# Patient Record
Sex: Female | Born: 1955 | Race: White | Hispanic: No | Marital: Single | Smoking: Former smoker
Health system: Southern US, Community
[De-identification: ages and names within clinical notes are randomized; demographics above are authoritative.]

## PROBLEM LIST (undated history)

## (undated) DIAGNOSIS — J45909 Unspecified asthma, uncomplicated: Secondary | ICD-10-CM

## (undated) DIAGNOSIS — F411 Generalized anxiety disorder: Secondary | ICD-10-CM

## (undated) DIAGNOSIS — T7840XA Allergy, unspecified, initial encounter: Secondary | ICD-10-CM

## (undated) DIAGNOSIS — J309 Allergic rhinitis, unspecified: Secondary | ICD-10-CM

## (undated) DIAGNOSIS — M722 Plantar fascial fibromatosis: Secondary | ICD-10-CM

## (undated) DIAGNOSIS — D219 Benign neoplasm of connective and other soft tissue, unspecified: Secondary | ICD-10-CM

## (undated) DIAGNOSIS — E041 Nontoxic single thyroid nodule: Secondary | ICD-10-CM

## (undated) DIAGNOSIS — R002 Palpitations: Secondary | ICD-10-CM

## (undated) DIAGNOSIS — K219 Gastro-esophageal reflux disease without esophagitis: Secondary | ICD-10-CM

## (undated) DIAGNOSIS — D682 Hereditary deficiency of other clotting factors: Secondary | ICD-10-CM

## (undated) DIAGNOSIS — I82409 Acute embolism and thrombosis of unspecified deep veins of unspecified lower extremity: Secondary | ICD-10-CM

## (undated) DIAGNOSIS — M1990 Unspecified osteoarthritis, unspecified site: Secondary | ICD-10-CM

## (undated) HISTORY — DX: Unspecified asthma, uncomplicated: J45.909

## (undated) HISTORY — DX: Plantar fascial fibromatosis: M72.2

## (undated) HISTORY — DX: Gastro-esophageal reflux disease without esophagitis: K21.9

## (undated) HISTORY — PX: JOINT REPLACEMENT: SHX530

## (undated) HISTORY — DX: Hereditary deficiency of other clotting factors: D68.2

## (undated) HISTORY — DX: Acute embolism and thrombosis of unspecified deep veins of unspecified lower extremity: I82.409

## (undated) HISTORY — DX: Generalized anxiety disorder: F41.1

## (undated) HISTORY — DX: Benign neoplasm of connective and other soft tissue, unspecified: D21.9

## (undated) HISTORY — PX: NO PAST SURGERIES: SHX2092

## (undated) HISTORY — DX: Allergic rhinitis, unspecified: J30.9

## (undated) HISTORY — DX: Palpitations: R00.2

## (undated) HISTORY — DX: Nontoxic single thyroid nodule: E04.1

## (undated) HISTORY — DX: Allergy, unspecified, initial encounter: T78.40XA

---

## 1999-01-26 ENCOUNTER — Emergency Department (HOSPITAL_COMMUNITY): Admission: EM | Admit: 1999-01-26 | Discharge: 1999-01-26 | Payer: Self-pay | Admitting: Emergency Medicine

## 1999-01-28 HISTORY — PX: OTHER SURGICAL HISTORY: SHX169

## 1999-06-15 ENCOUNTER — Ambulatory Visit (HOSPITAL_COMMUNITY): Admission: RE | Admit: 1999-06-15 | Discharge: 1999-06-15 | Payer: Self-pay | Admitting: General Surgery

## 2001-05-21 ENCOUNTER — Other Ambulatory Visit: Admission: RE | Admit: 2001-05-21 | Discharge: 2001-05-21 | Payer: Self-pay | Admitting: Obstetrics and Gynecology

## 2001-05-28 ENCOUNTER — Encounter: Payer: Self-pay | Admitting: Obstetrics and Gynecology

## 2001-05-28 ENCOUNTER — Ambulatory Visit (HOSPITAL_COMMUNITY): Admission: RE | Admit: 2001-05-28 | Discharge: 2001-05-28 | Payer: Self-pay | Admitting: Obstetrics and Gynecology

## 2001-12-08 ENCOUNTER — Ambulatory Visit (HOSPITAL_COMMUNITY): Admission: RE | Admit: 2001-12-08 | Discharge: 2001-12-08 | Payer: Self-pay | Admitting: Obstetrics and Gynecology

## 2001-12-08 ENCOUNTER — Encounter: Payer: Self-pay | Admitting: Obstetrics and Gynecology

## 2002-05-26 ENCOUNTER — Other Ambulatory Visit: Admission: RE | Admit: 2002-05-26 | Discharge: 2002-05-26 | Payer: Self-pay | Admitting: Obstetrics and Gynecology

## 2003-06-02 ENCOUNTER — Other Ambulatory Visit: Admission: RE | Admit: 2003-06-02 | Discharge: 2003-06-02 | Payer: Self-pay | Admitting: Obstetrics and Gynecology

## 2003-06-30 ENCOUNTER — Ambulatory Visit (HOSPITAL_COMMUNITY): Admission: RE | Admit: 2003-06-30 | Discharge: 2003-06-30 | Payer: Self-pay | Admitting: Obstetrics and Gynecology

## 2004-11-26 ENCOUNTER — Other Ambulatory Visit: Admission: RE | Admit: 2004-11-26 | Discharge: 2004-11-26 | Payer: Self-pay | Admitting: Obstetrics and Gynecology

## 2007-08-20 ENCOUNTER — Emergency Department (HOSPITAL_COMMUNITY): Admission: EM | Admit: 2007-08-20 | Discharge: 2007-08-20 | Payer: Self-pay | Admitting: Emergency Medicine

## 2007-08-20 ENCOUNTER — Ambulatory Visit: Payer: Self-pay | Admitting: Surgery

## 2007-08-20 ENCOUNTER — Ambulatory Visit: Admission: RE | Admit: 2007-08-20 | Discharge: 2007-08-20 | Payer: Self-pay | Admitting: Family Medicine

## 2007-08-20 ENCOUNTER — Encounter: Payer: Self-pay | Admitting: Family Medicine

## 2007-08-20 DIAGNOSIS — I82409 Acute embolism and thrombosis of unspecified deep veins of unspecified lower extremity: Secondary | ICD-10-CM | POA: Insufficient documentation

## 2007-08-25 ENCOUNTER — Encounter: Payer: Self-pay | Admitting: Internal Medicine

## 2007-09-09 ENCOUNTER — Ambulatory Visit: Payer: Self-pay | Admitting: Oncology

## 2007-09-23 LAB — CBC WITH DIFFERENTIAL/PLATELET
BASO%: 0.4 % (ref 0.0–2.0)
Basophils Absolute: 0 10e3/uL (ref 0.0–0.1)
EOS%: 1.7 % (ref 0.0–7.0)
Eosinophils Absolute: 0.1 10e3/uL (ref 0.0–0.5)
HCT: 40.5 % (ref 34.8–46.6)
HGB: 14 g/dL (ref 11.6–15.9)
LYMPH%: 32.1 % (ref 14.0–48.0)
MCH: 31.7 pg (ref 26.0–34.0)
MCHC: 34.5 g/dL (ref 32.0–36.0)
MCV: 91.8 fL (ref 81.0–101.0)
MONO#: 0.6 10e3/uL (ref 0.1–0.9)
MONO%: 8 % (ref 0.0–13.0)
NEUT#: 4.3 10e3/uL (ref 1.5–6.5)
NEUT%: 57.8 % (ref 39.6–76.8)
Platelets: 194 10e3/uL (ref 145–400)
RBC: 4.41 10e6/uL (ref 3.70–5.32)
RDW: 13.5 % (ref 11.3–14.5)
WBC: 7.4 10e3/uL (ref 3.9–10.0)
lymph#: 2.4 10e3/uL (ref 0.9–3.3)

## 2007-10-11 LAB — HYPERCOAGULABLE PANEL, COMPREHENSIVE RET.
DRVVT 1:1 Mix: 38.8 secs (ref 36.1–47.0)
DRVVT: 54.5 secs — ABNORMAL HIGH (ref 36.1–47.0)
Homocysteine: 6 umol/L (ref 4.0–15.4)
PTT Lupus Anticoagulant: 53.4 secs — ABNORMAL HIGH (ref 36.3–48.8)
Protein C Activity: 33 % — ABNORMAL LOW (ref 75–133)
Protein C, Total: 58 % — ABNORMAL LOW (ref 70–140)
Protein S Ag, Total: 68 % — ABNORMAL LOW (ref 70–140)

## 2007-10-26 ENCOUNTER — Ambulatory Visit: Payer: Self-pay | Admitting: Oncology

## 2008-08-27 DIAGNOSIS — E041 Nontoxic single thyroid nodule: Secondary | ICD-10-CM

## 2008-08-27 HISTORY — DX: Nontoxic single thyroid nodule: E04.1

## 2008-09-01 ENCOUNTER — Encounter: Admission: RE | Admit: 2008-09-01 | Discharge: 2008-09-01 | Payer: Self-pay | Admitting: Emergency Medicine

## 2008-09-01 ENCOUNTER — Encounter: Payer: Self-pay | Admitting: Internal Medicine

## 2009-02-07 DIAGNOSIS — M7989 Other specified soft tissue disorders: Secondary | ICD-10-CM

## 2009-02-09 ENCOUNTER — Ambulatory Visit: Payer: Self-pay

## 2009-02-09 ENCOUNTER — Ambulatory Visit: Payer: Self-pay | Admitting: Internal Medicine

## 2009-02-09 ENCOUNTER — Encounter: Payer: Self-pay | Admitting: Internal Medicine

## 2009-02-09 DIAGNOSIS — J45909 Unspecified asthma, uncomplicated: Secondary | ICD-10-CM | POA: Insufficient documentation

## 2009-02-09 DIAGNOSIS — K219 Gastro-esophageal reflux disease without esophagitis: Secondary | ICD-10-CM | POA: Insufficient documentation

## 2009-02-09 DIAGNOSIS — J309 Allergic rhinitis, unspecified: Secondary | ICD-10-CM | POA: Insufficient documentation

## 2009-02-09 LAB — CONVERTED CEMR LAB

## 2009-02-12 DIAGNOSIS — E041 Nontoxic single thyroid nodule: Secondary | ICD-10-CM

## 2009-02-15 ENCOUNTER — Telehealth: Payer: Self-pay | Admitting: Internal Medicine

## 2009-02-19 ENCOUNTER — Ambulatory Visit: Payer: Self-pay | Admitting: Oncology

## 2009-02-21 ENCOUNTER — Telehealth: Payer: Self-pay | Admitting: Internal Medicine

## 2009-02-26 ENCOUNTER — Ambulatory Visit: Payer: Self-pay | Admitting: Cardiology

## 2009-02-26 ENCOUNTER — Encounter (INDEPENDENT_AMBULATORY_CARE_PROVIDER_SITE_OTHER): Payer: Self-pay | Admitting: *Deleted

## 2009-02-26 LAB — CONVERTED CEMR LAB: POC INR: 1.1

## 2009-03-01 ENCOUNTER — Ambulatory Visit: Payer: Self-pay | Admitting: Internal Medicine

## 2009-03-01 LAB — CONVERTED CEMR LAB: POC INR: 1.6

## 2009-03-05 ENCOUNTER — Telehealth: Payer: Self-pay | Admitting: Cardiology

## 2009-03-05 ENCOUNTER — Encounter (INDEPENDENT_AMBULATORY_CARE_PROVIDER_SITE_OTHER): Payer: Self-pay | Admitting: Cardiology

## 2009-03-05 ENCOUNTER — Ambulatory Visit: Payer: Self-pay | Admitting: Cardiology

## 2009-03-07 ENCOUNTER — Ambulatory Visit: Payer: Self-pay | Admitting: Cardiology

## 2009-03-12 ENCOUNTER — Ambulatory Visit: Payer: Self-pay | Admitting: Internal Medicine

## 2009-03-12 LAB — CONVERTED CEMR LAB: TSH: 1.12 microintl units/mL (ref 0.35–5.50)

## 2009-03-14 ENCOUNTER — Ambulatory Visit: Payer: Self-pay | Admitting: Internal Medicine

## 2009-03-20 ENCOUNTER — Encounter: Admission: RE | Admit: 2009-03-20 | Discharge: 2009-03-20 | Payer: Self-pay | Admitting: Internal Medicine

## 2009-03-21 ENCOUNTER — Ambulatory Visit: Payer: Self-pay | Admitting: Internal Medicine

## 2009-03-21 LAB — CONVERTED CEMR LAB: POC INR: 3

## 2009-04-02 ENCOUNTER — Ambulatory Visit: Payer: Self-pay | Admitting: Internal Medicine

## 2009-04-02 LAB — CONVERTED CEMR LAB: POC INR: 2.2

## 2009-04-16 ENCOUNTER — Ambulatory Visit: Payer: Self-pay | Admitting: Cardiology

## 2009-04-16 LAB — CONVERTED CEMR LAB: POC INR: 2.9

## 2009-05-14 ENCOUNTER — Ambulatory Visit: Payer: Self-pay | Admitting: Cardiology

## 2009-05-14 LAB — CONVERTED CEMR LAB: POC INR: 3.2

## 2009-06-04 ENCOUNTER — Ambulatory Visit: Payer: Self-pay | Admitting: Cardiology

## 2009-06-04 LAB — CONVERTED CEMR LAB: POC INR: 2.6

## 2009-06-05 ENCOUNTER — Ambulatory Visit: Payer: Self-pay | Admitting: Oncology

## 2009-07-02 ENCOUNTER — Ambulatory Visit: Payer: Self-pay | Admitting: Cardiology

## 2009-07-04 ENCOUNTER — Telehealth (INDEPENDENT_AMBULATORY_CARE_PROVIDER_SITE_OTHER): Payer: Self-pay | Admitting: *Deleted

## 2009-08-02 ENCOUNTER — Ambulatory Visit: Payer: Self-pay | Admitting: Internal Medicine

## 2009-08-30 ENCOUNTER — Ambulatory Visit: Payer: Self-pay | Admitting: Cardiovascular Disease

## 2009-08-30 LAB — CONVERTED CEMR LAB: POC INR: 2.4

## 2009-10-04 ENCOUNTER — Ambulatory Visit: Payer: Self-pay | Admitting: Internal Medicine

## 2009-11-01 ENCOUNTER — Ambulatory Visit: Payer: Self-pay | Admitting: Internal Medicine

## 2009-11-29 ENCOUNTER — Ambulatory Visit: Payer: Self-pay | Admitting: Cardiology

## 2009-12-27 ENCOUNTER — Ambulatory Visit: Payer: Self-pay | Admitting: Cardiovascular Disease

## 2009-12-27 LAB — CONVERTED CEMR LAB: POC INR: 2.4

## 2010-01-24 ENCOUNTER — Ambulatory Visit: Payer: Self-pay | Admitting: Cardiology

## 2010-01-24 LAB — CONVERTED CEMR LAB: POC INR: 2.2

## 2010-02-04 ENCOUNTER — Ambulatory Visit: Payer: Self-pay | Admitting: Oncology

## 2010-02-06 ENCOUNTER — Encounter: Payer: Self-pay | Admitting: Internal Medicine

## 2010-02-21 ENCOUNTER — Emergency Department (HOSPITAL_BASED_OUTPATIENT_CLINIC_OR_DEPARTMENT_OTHER)
Admission: EM | Admit: 2010-02-21 | Discharge: 2010-02-22 | Payer: Self-pay | Source: Home / Self Care | Admitting: Emergency Medicine

## 2010-02-21 ENCOUNTER — Ambulatory Visit: Admission: RE | Admit: 2010-02-21 | Discharge: 2010-02-21 | Payer: Self-pay | Source: Home / Self Care

## 2010-02-21 ENCOUNTER — Encounter: Payer: Self-pay | Admitting: Internal Medicine

## 2010-02-21 LAB — CONVERTED CEMR LAB
Basophils Relative: 0 %
CO2: 26 meq/L
Calcium: 9.9 mg/dL
Eosinophils Relative: 2 %
Glucose, Bld: 105 mg/dL
Hemoglobin: 14.2 g/dL
Lymphocytes, automated: 34 %
Monocytes Relative: 9 %
Neutrophils Relative %: 56 %
POC INR: 2.5
WBC: 9.5 10*3/uL

## 2010-02-21 LAB — CBC
Hemoglobin: 14.2 g/dL (ref 12.0–15.0)
MCH: 30.7 pg (ref 26.0–34.0)
MCV: 88.1 fL (ref 78.0–100.0)
RBC: 4.62 MIL/uL (ref 3.87–5.11)

## 2010-02-21 LAB — DIFFERENTIAL
Basophils Relative: 0 % (ref 0–1)
Lymphs Abs: 3.2 10*3/uL (ref 0.7–4.0)
Monocytes Relative: 9 % (ref 3–12)
Neutro Abs: 5.3 10*3/uL (ref 1.7–7.7)
Neutrophils Relative %: 56 % (ref 43–77)

## 2010-02-21 LAB — BASIC METABOLIC PANEL
GFR calc non Af Amer: >60 mL/min (ref >60)
Glucose, Bld: 105 mg/dL — ABNORMAL HIGH (ref 70–99)
Potassium: 4 mEq/L (ref 3.5–5.1)
Sodium: 142 mEq/L (ref 135–145)

## 2010-02-21 LAB — PROTIME-INR: Prothrombin Time: 21.3 seconds — ABNORMAL HIGH (ref 11.6–15.2)

## 2010-02-22 ENCOUNTER — Telehealth (INDEPENDENT_AMBULATORY_CARE_PROVIDER_SITE_OTHER): Payer: Self-pay | Admitting: *Deleted

## 2010-02-22 ENCOUNTER — Ambulatory Visit
Admission: RE | Admit: 2010-02-22 | Discharge: 2010-02-22 | Payer: Self-pay | Source: Home / Self Care | Attending: Internal Medicine | Admitting: Internal Medicine

## 2010-02-22 DIAGNOSIS — B079 Viral wart, unspecified: Secondary | ICD-10-CM | POA: Insufficient documentation

## 2010-02-22 DIAGNOSIS — R0602 Shortness of breath: Secondary | ICD-10-CM | POA: Insufficient documentation

## 2010-02-26 NOTE — Medication Information (Signed)
Summary: rov/td  Anticoagulant Therapy  Managed by: Cloyde Reams, RN, BSN Referring MD: Rene Paci, MD PCP: Newt Lukes MD Supervising MD: Tenny Craw MD, Gunnar Fusi Indication 1: DVT Lab Used: [facility] [place] Site: [address] INR POC 1.6 INR RANGE 2.0 - 3.0  Dietary changes: no    Health status changes: no    Bleeding/hemorrhagic complications: no    Recent/future hospitalizations: no    Any changes in medication regimen? no    Recent/future dental: no  Any missed doses?: no       Is patient compliant with meds? yes      Comments: Contines on Lovenox.    Allergies: 1)  ! Codeine  Anticoagulation Management History:      The patient is taking warfarin and comes in today for a routine follow up visit.  Negative risk factors for bleeding include an age less than 24 years old.  The bleeding index is 'low risk'.  Negative CHADS2 values include Age > 67 years old.  Anticoagulation responsible provider: Tenny Craw MD, Gunnar Fusi.  INR POC: 1.6.  Cuvette Lot#: [id number].  Exp: 04/2010.    Anticoagulation Management Assessment/Plan:      The patient's current anticoagulation dose is Warfarin sodium 5 mg tabs: Marland Kitchen  The next INR is due 03/05/2009.  Anticoagulation instructions were given to patient.  Results were reviewed/authorized by Cloyde Reams, RN, BSN.  She was notified by Cloyde Reams RN.         Prior Anticoagulation Instructions: INR 1.1  Take 10 mg = 2 tabs daily for 3 days until recheck INR on Thursday. Continue lovenox 110 mg subcutaneously two times a day until recheck.    Current Anticoagulation Instructions: INR 1.6  Continue on 10mg  daily.  Continue Lovenox twice daily and Recheck  on Monday.   Prescriptions: LOVENOX 120 MG/0.8ML SOLN (ENOXAPARIN SODIUM) Inject 110 mg subcutaneously into abdomen two times a day.  #14 x 0   Entered by:   Cloyde Reams RN   Authorized by:   Rollene Rotunda, MD, [facility]   Signed by:   Cloyde Reams RN on 03/01/2009  Method used:   Print then Give to Patient   RxID:   [id number]   Appended Document: rov/td    Prescriptions: LOVENOX 120 MG/0.8ML SOLN (ENOXAPARIN SODIUM) Inject 110 mg subcutaneously into abdomen two times a day.  #14 x 0   Entered by:   Cloyde Reams RN   Authorized by:   Sherrill Raring, MD, [facility]   Signed by:   Cloyde Reams RN on 03/01/2009   Method used:   Faxed to ...       [facility] (retail)       [address]       [place], [place]  [address]       Ph: [phone]       Fax: [phone]   RxID:   [id number]

## 2010-02-26 NOTE — Letter (Signed)
Summary: Handout Printed  Printed Handout:  - Coumadin Instructions-w/out Meds 

## 2010-02-26 NOTE — Progress Notes (Signed)
Summary: missing direction on warfarin   Phone Note From Pharmacy   Caller: [facility] [phone] ref # [id number] Request: Speak with Nurse Details for Reason: missing direction on warfain 5 mg.  Initial call taken by: Lorne Skeens,  March 05, 2009 11:41 AM  Follow-up for Phone Call        Called back to [facility] at 1210.Marland Kitchenmp clarified to: use as directed by anticoagulation clinic up to 2 tabs/daily.  180 tabs 1 refill (confirmed with Sonny, RPh) Follow-up by: Shelby Dubin PharmD, BCPS, CPP,  March 05, 2009 12:15 PM

## 2010-02-26 NOTE — Medication Information (Signed)
Summary: rov/ewj  Anticoagulant Therapy  Managed by: Bethena Midget, RN, BSN Referring MD: Rene Paci, MD PCP: Newt Lukes MD Supervising MD: Ladona Ridgel MD, Sharlot Gowda Indication 1: DVT Lab Used: [facility] [place] Site: [address] INR POC 2.2 INR RANGE 2.0 - 3.0  Dietary changes: no    Health status changes: no    Bleeding/hemorrhagic complications: no    Recent/future hospitalizations: no    Any changes in medication regimen? no    Recent/future dental: no  Any missed doses?: yes     Details: missed Sunday dose  Is patient compliant with meds? yes       Allergies: 1)  ! Codeine  Anticoagulation Management History:      The patient is taking warfarin and comes in today for a routine follow up visit.  Negative risk factors for bleeding include an age less than 34 years old.  The bleeding index is 'low risk'.  Negative CHADS2 values include Age > 49 years old.  Anticoagulation responsible provider: Ladona Ridgel MD, Sharlot Gowda.  INR POC: 2.2.  Cuvette Lot#: [id number].  Exp: 05/2010.    Anticoagulation Management Assessment/Plan:      The patient's current anticoagulation dose is Warfarin sodium 5 mg tabs: Marland Kitchen  The target INR is 2.0-3.0.  The next INR is due 04/16/2009.  Anticoagulation instructions were given to patient.  Results were reviewed/authorized by Bethena Midget, RN, BSN.  She was notified by Bethena Midget, RN, BSN.         Prior Anticoagulation Instructions: INR 3.0  Continue on same dosage 10mg  daily except 5mg  on Wednesdays. Recheck in 10 days.      Current Anticoagulation Instructions: INR 2.2 Today take 15mg s then resume 10mg s daily except 5mg s on Wednesdays. Recheck in 2 weeks.

## 2010-02-26 NOTE — Assessment & Plan Note (Signed)
Summary: FOLLOW UP-LB   Vital Signs:  Patient profile:   55 year old female Height:      69.5 inches (176.53 cm) Weight:      238.0 pounds (108.18 kg) O2 Sat:      96 % on Room air Temp:     98.2 degrees F (36.78 degrees C) oral Pulse rate:   86 / minute BP sitting:   112 / 72  (left arm) Cuff size:   regular  Vitals Entered By: Orlan Leavens (March 12, 2009 8:38 AM)  O2 Flow:  Room air CC: follow-up visit Is Patient Diabetic? No Pain Assessment Patient in pain? no        Primary Care Provider:  Newt Lukes MD  CC:  follow-up visit.  History of Present Illness: here for f/u  1) hx recurrent DVT x3 with FVL defic - last dx 1/11 has seen heme in f/u rec life long coumadin, min 6 mos no CP or SOB persisting mild swelling of leg  2) thyroid nodules - dx [place] 8/10 - due for 6 mos recheck no palp or weight changes no trouble swallowing or difficulty breathing - largely unaware of any "neck symptoms"  3)GERD - reports compliance with ongoing medical treatment and no changes in medication dose or frequency. denies adverse side effects related to current therapy.   Current Medications (verified): 1)  Advair Diskus 100-50 Mcg/dose Aepb (Fluticasone-Salmeterol) .Marland Kitchen.. 1 Puff Two Times A Day 2)  Pepcid Complete 10-800-165 Mg Chew (Famotidine-Ca Carb-Mag Hydrox) .... Take 1 By Mouth Qd 3)  Warfarin Sodium 5 Mg Tabs (Warfarin Sodium)  Allergies (verified): 1)  ! Codeine  Past History:  Past Medical History: Allergic rhinitis Asthma GERD factor v leiden defic, hx recurrent dvt (lle 1990, rle 7/09 & 1/11) thyroid nodules -[place] 8/10  MD rooster: heme - shadad  Family History: Family History of Arthritis (parent) Family History Hypertension (parent) Family History Uterine cancer (grandparent) Heart disease (grandparnet)  mom- parathyroid growth  Review of Systems  The patient denies fever, weight loss, weight gain, chest pain, syncope, peripheral edema,  and headaches.    Physical Exam  General:  overweight-appearing.  alert, well-developed, well-nourished, and cooperative to examination.    Neck:  mild fullness anteriorbilaterally  without discrete nodules appreciated - nontender Lungs:  normal respiratory effort, no intercostal retractions or use of accessory muscles; normal breath sounds bilaterally - no crackles and no wheezes.    Heart:  normal rate, regular rhythm, no murmur, and no rub. BLE with chronic 1+ edema. 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 mgmt anticoag as per [facility] ongoing  Problem # 3:  GERD (ICD-530.81) Assessment: Unchanged  Her updated medication list for this problem includes:    Pepcid Complete 10-800-165 Mg Chew (Famotidine-ca carb-mag hydrox) .Marland Kitchen... Take 1 by mouth qd  Problem # 4:  ASTHMA (ICD-493.90) Assessment: Unchanged  Her updated medication list for this problem includes:    Advair Diskus 100-50 Mcg/dose Aepb (  Fluticasone-salmeterol) .Marland Kitchen... 1 puff two times a day  Complete Medication List: 1)  Advair Diskus 100-50 Mcg/dose Aepb (Fluticasone-salmeterol) .Marland Kitchen.. 1 puff two times a day 2)  Pepcid Complete 10-800-165 Mg Chew (Famotidine-ca carb-mag hydrox) .... Take 1 by mouth qd 3)  Warfarin Sodium 5 Mg Tabs (Warfarin sodium)  Patient Instructions: 1)  it was good to see you today. 2)  test(s) ordered today - your results will be posted on the phone tree for review in 48-72  hours from the time of test completion; call [phone] and enter your 9 digit MRN (listed above on this page, just below your name); if any changes need to be made or there are abnormal results, you will be contacted directly.  3)  we'll make referral for thyroid ultrasound. Our office will contact you regarding this appointment once made.   4)  If any changes or other testing needs to occur based on these results, you will be contacted directly 5)  otherwise, please schedule a follow-up appointment in 6 months, sooner if problems.

## 2010-02-26 NOTE — Medication Information (Signed)
Summary: rov/tm  Anticoagulant Therapy  Managed by: Shelby Dubin, PharmD, BCPS, CPP Referring MD: Rene Paci, MD PCP: Newt Lukes MD Supervising MD: Shirlee Latch MD, Rhaya Coale Indication 1: DVT Lab Used: [facility] [place] Site: [address] INR POC 1.1 INR RANGE 2.0 - 3.0  Dietary changes: no    Health status changes: no    Bleeding/hemorrhagic complications: no    Recent/future hospitalizations: no    Any changes in medication regimen? yes       Details: started warfarin after Dr. Hulda Humphrey appt on Wed.    Recent/future dental: no  Any missed doses?: no       Is patient compliant with meds? yes      Comments: Took 7.5 mg for 2 days then 5 mg daily.    Current Medications (verified): 1)  Advair Diskus 100-50 Mcg/dose Aepb (Fluticasone-Salmeterol) .Marland Kitchen.. 1 Puff Two Times A Day 2)  Pepcid Complete 10-800-165 Mg Chew (Famotidine-Ca Carb-Mag Hydrox) .... Take 1 By Mouth Qd 3)  Lovenox 120 Mg/0.20ml Soln (Enoxaparin Sodium) .... Inject 110 Mg Subcutaneously Into Abdomen Two Times A Day. 4)  Warfarin Sodium 5 Mg Tabs (Warfarin Sodium)  Allergies (verified): 1)  ! Codeine  Anticoagulation Management History:      The patient is taking warfarin and comes in today for a routine follow up visit.  Negative risk factors for bleeding include an age less than 58 years old.  The bleeding index is 'low risk'.  Negative CHADS2 values include Age > 8 years old.  Anticoagulation responsible provider: Shirlee Latch MD, Austynn Pridmore.  INR POC: 1.1.  Cuvette Lot#: [id number].  Exp: 04/2010.    Anticoagulation Management Assessment/Plan:      The patient's current anticoagulation dose is Warfarin sodium 5 mg tabs: Marland Kitchen  The next INR is due 03/01/2009.  Anticoagulation instructions were given to patient.  Results were reviewed/authorized by Shelby Dubin, PharmD, BCPS, CPP.  She was notified by Bethanne Ginger.         Prior Anticoagulation Instructions: Begin lovenox 110 mg injection into  abdomen two times a day. Alternate sites and sides.    Lovenox 100 mg subcutaneously administered in RLE at 1630.  No complaints or issues.  Injection technique reviewed with patient.    Plan:  patient will begin lovenox 110 mg subcutaneously two times a day for the next week while a decision is made by Drs. Shedad and [employer] regarding potential re-initiation of warfarin therapy.  Patient verbalizes understanding that she may have to take lovenox for a longer period because we are not initiating warfarin today.    Current Anticoagulation Instructions: INR 1.1  Take 10 mg = 2 tabs daily for 3 days until recheck INR on Thursday. Continue lovenox 110 mg subcutaneously two times a day until recheck.   Prescriptions: WARFARIN SODIUM 5 MG TABS (WARFARIN SODIUM)   #90 x 1   Entered by:   Bethanne Ginger   Authorized by:   Marca Ancona, MD   Signed by:   Bethanne Ginger on 02/26/2009   Method used:   Faxed to ...       [facility] (mail-order)             , [place]         Ph: [phone]       Fax: [phone]   RxID:   [id number]

## 2010-02-26 NOTE — Medication Information (Signed)
Summary: rov/cs  Anticoagulant Therapy  Managed by: Bethena Midget, RN, BSN Referring MD: Rene Paci, MD PCP: Newt Lukes MD Supervising MD: Daleen Squibb MD, Maisie Fus Indication 1: DVT Lab Used: [facility] [place] Site: [address] INR POC 3.2 INR RANGE 2.0 - 3.0  Dietary changes: yes       Details: Ate less leafy veggies last week, had GI bug  Health status changes: yes       Details: GI bug last week, resolved now  Bleeding/hemorrhagic complications: no    Recent/future hospitalizations: no    Any changes in medication regimen? no    Recent/future dental: no  Any missed doses?: no       Is patient compliant with meds? yes       Allergies: 1)  ! Codeine  Anticoagulation Management History:      The patient is taking warfarin and comes in today for a routine follow up visit.  Negative risk factors for bleeding include an age less than 66 years old.  The bleeding index is 'low risk'.  Negative CHADS2 values include Age > 3 years old.  Anticoagulation responsible provider: Daleen Squibb MD, Maisie Fus.  INR POC: 3.2.  Cuvette Lot#: [id number].  Exp: 11/2010.    Anticoagulation Management Assessment/Plan:      The patient's current anticoagulation dose is Warfarin sodium 5 mg tabs: Marland Kitchen  The target INR is 2.0-3.0.  The next INR is due 12/27/2009.  Anticoagulation instructions were given to patient.  Results were reviewed/authorized by Bethena Midget, RN, BSN.  She was notified by Bethena Midget, RN, BSN.         Prior Anticoagulation Instructions: INR 2.8  Continue Coumadin as scheduled:  2 tablets everyday of the week except 1 tablet on Wednesday.   Return in 4 weeks.    Current Anticoagulation Instructions: INR 3.2 Today only take 1 tablet then resume 2 tablets daily except 1 tablet on Wednesdays. Recheck in 4 weeks.

## 2010-02-26 NOTE — Medication Information (Signed)
Summary: rov/tm  Anticoagulant Therapy  Managed by: Weston Brass, PharmD Referring MD: Rene Paci, MD PCP: Newt Lukes MD Supervising MD: Eden Emms MD, Theron Arista Indication 1: DVT Lab Used: [facility] [place] Site: [address] INR POC 2.4 INR RANGE 2.0 - 3.0  Dietary changes: no    Health status changes: no    Bleeding/hemorrhagic complications: no    Recent/future hospitalizations: no    Any changes in medication regimen? no    Recent/future dental: no  Any missed doses?: no       Is patient compliant with meds? yes       Allergies: 1)  ! Codeine  Anticoagulation Management History:      The patient is taking warfarin and comes in today for a routine follow up visit.  Negative risk factors for bleeding include an age less than 26 years old.  The bleeding index is 'low risk'.  Negative CHADS2 values include Age > 35 years old.  Anticoagulation responsible provider: Eden Emms MD, Theron Arista.  INR POC: 2.4.  Cuvette Lot#: [id number].  Exp: 12/2010.    Anticoagulation Management Assessment/Plan:      The patient's current anticoagulation dose is Warfarin sodium 5 mg tabs: Marland Kitchen  The target INR is 2.0-3.0.  The next INR is due 01/24/2010.  Anticoagulation instructions were given to patient.  Results were reviewed/authorized by Weston Brass, PharmD.  She was notified by Weston Brass PharmD.         Prior Anticoagulation Instructions: INR 3.2 Today only take 1 tablet then resume 2 tablets daily except 1 tablet on Wednesdays. Recheck in 4 weeks.   Current Anticoagulation Instructions: INR 2.4  Continue same dose of 2 tablets every day except 1 tablet on Wednesday.  Recheck INR in 4 weeks.

## 2010-02-26 NOTE — Medication Information (Signed)
Summary: rov/tm  Anticoagulant Therapy  Managed by: Bethena Midget, RN, BSN Referring MD: Rene Paci, MD PCP: Newt Lukes MD Supervising MD: Shirlee Latch MD, Dally Oshel Indication 1: DVT Lab Used: [facility] [place] Site: [address] INR POC 2.9 INR RANGE 2.0 - 3.0  Dietary changes: no    Health status changes: no    Bleeding/hemorrhagic complications: no    Recent/future hospitalizations: no    Any changes in medication regimen? yes       Details: Taking Sudafed and  Vitamin C PRN  Recent/future dental: no  Any missed doses?: no       Is patient compliant with meds? yes       Allergies: 1)  ! Codeine  Anticoagulation Management History:      The patient is taking warfarin and comes in today for a routine follow up visit.  Negative risk factors for bleeding include an age less than 39 years old.  The bleeding index is 'low risk'.  Negative CHADS2 values include Age > 53 years old.  Anticoagulation responsible provider: Shirlee Latch MD, Ram Haugan.  INR POC: 2.9.  Cuvette Lot#: [id number].  Exp: 05/2010.    Anticoagulation Management Assessment/Plan:      The patient's current anticoagulation dose is Warfarin sodium 5 mg tabs: Marland Kitchen  The target INR is 2.0-3.0.  The next INR is due 05/14/2009.  Anticoagulation instructions were given to patient.  Results were reviewed/authorized by Bethena Midget, RN, BSN.  She was notified by Bethena Midget, RN, BSN.         Prior Anticoagulation Instructions: INR 2.2 Today take 15mg s then resume 10mg s daily except 5mg s on Wednesdays. Recheck in 2 weeks.   Current Anticoagulation Instructions: INR 2.9 Continue 10mg  everyday except 5mg s on Wednesdays. Recheck in 4 weeks.

## 2010-02-26 NOTE — Medication Information (Signed)
Summary: rov/sp  Anticoagulant Therapy  Managed by: Weston Brass, PharmD Referring MD: Rene Paci, MD PCP: Newt Lukes MD Supervising MD: Antoine Poche MD, Fayrene Fearing Indication 1: DVT Lab Used: [facility] [place] Site: [address] INR POC 2.6 INR RANGE 2.0 - 3.0  Dietary changes: no    Health status changes: no    Bleeding/hemorrhagic complications: no    Recent/future hospitalizations: no    Any changes in medication regimen? no    Recent/future dental: no  Any missed doses?: yes     Details: missed 1 dose but took extra the next day  Is patient compliant with meds? yes       Allergies: 1)  ! Codeine  Anticoagulation Management History:      The patient is taking warfarin and comes in today for a routine follow up visit.  Negative risk factors for bleeding include an age less than 9 years old.  The bleeding index is 'low risk'.  Negative CHADS2 values include Age > 17 years old.  Anticoagulation responsible provider: Antoine Poche MD, Fayrene Fearing.  INR POC: 2.6.  Cuvette Lot#: [id number].  Exp: 08/2010.    Anticoagulation Management Assessment/Plan:      The patient's current anticoagulation dose is Warfarin sodium 5 mg tabs: Marland Kitchen  The target INR is 2.0-3.0.  The next INR is due 08/02/2009.  Anticoagulation instructions were given to patient.  Results were reviewed/authorized by Weston Brass, PharmD.  She was notified by Weston Brass PharmD.         Prior Anticoagulation Instructions: INR 2.6  Continue same dose of 2 tablets every day except 1 tablet on Wednesday   Current Anticoagulation Instructions: INR 2.6  Continue same dose of 2 tablets every day except 1 tablet on Wednesday

## 2010-02-26 NOTE — Medication Information (Signed)
Summary: rov/sp  Anticoagulant Therapy  Managed by: Weston Brass, PharmD Referring MD: Rene Paci, MD PCP: Newt Lukes MD Supervising MD: Riley Kill MD, Maisie Fus Indication 1: DVT Lab Used: [facility] [place] Site: [address] INR POC 2.6 INR RANGE 2.0 - 3.0  Dietary changes: no    Health status changes: no    Bleeding/hemorrhagic complications: no    Recent/future hospitalizations: no    Any changes in medication regimen? no    Recent/future dental: no  Any missed doses?: no       Is patient compliant with meds? yes       Allergies: 1)  ! Codeine  Anticoagulation Management History:      The patient is taking warfarin and comes in today for a routine follow up visit.  Negative risk factors for bleeding include an age less than 63 years old.  The bleeding index is 'low risk'.  Negative CHADS2 values include Age > 30 years old.  Anticoagulation responsible provider: Riley Kill MD, Maisie Fus.  INR POC: 2.6.  Cuvette Lot#: [id number].  Exp: 06/2010.    Anticoagulation Management Assessment/Plan:      The patient's current anticoagulation dose is Warfarin sodium 5 mg tabs: Marland Kitchen  The target INR is 2.0-3.0.  The next INR is due 07/02/2009.  Anticoagulation instructions were given to patient.  Results were reviewed/authorized by Weston Brass, PharmD.  She was notified by Weston Brass PharmD.         Prior Anticoagulation Instructions: INR 3.2  Take 1 tablet today then continue same dose of 2 tablets every day except 1 tablet on Wednesday   Current Anticoagulation Instructions: INR 2.6  Continue same dose of 2 tablets every day except 1 tablet on Wednesday

## 2010-02-26 NOTE — Progress Notes (Signed)
  Phone Note Refill Request Call back at [phone] Message from:  Pharmacy on July 04, 2009 2:39 PM  Refills Requested: Medication #1:  WARFARIN SODIUM 5 MG TABS.   Supply Requested: 3 months Need dosage directions/[facility], ref # [id number]   Method Requested: Fax to Mail Away Pharmacy  Follow-up for Phone Call        Spoke with [facility] Pharmacist [facility]) and gave present instructions but to add Or As Directed due to INR result.  Follow-up by: Bethena Midget, RN, BSN,  July 04, 2009 4:43 PM

## 2010-02-26 NOTE — Medication Information (Signed)
Summary: rov/ewj  Anticoagulant Therapy  Managed by: Shelby Dubin, PharmD, BCPS, CPP Referring MD: Rene Paci, MD PCP: Newt Lukes MD Supervising MD: Jens Som MD, Arlys John Indication 1: DVT Lab Used: [facility] [place] Site: [address] INR POC 1.9 INR RANGE 2.0 - 3.0  Dietary changes: no    Health status changes: no    Bleeding/hemorrhagic complications: no    Recent/future hospitalizations: no    Any changes in medication regimen? no    Recent/future dental: no  Any missed doses?: yes     Details: missed Sunday am dose of lovenox...  Is patient compliant with meds? yes      Comments: menstrual cycle started yest...normal, though heavier flow noted on day #1..  Current Medications (verified): 1)  Advair Diskus 100-50 Mcg/dose Aepb (Fluticasone-Salmeterol) .Marland Kitchen.. 1 Puff Two Times A Day 2)  Pepcid Complete 10-800-165 Mg Chew (Famotidine-Ca Carb-Mag Hydrox) .... Take 1 By Mouth Qd 3)  Lovenox 120 Mg/0.79ml Soln (Enoxaparin Sodium) .... Inject 110 Mg Subcutaneously Into Abdomen Two Times A Day. 4)  Warfarin Sodium 5 Mg Tabs (Warfarin Sodium)  Allergies (verified): 1)  ! Codeine  Anticoagulation Management History:      The patient is taking warfarin and comes in today for a routine follow up visit.  Negative risk factors for bleeding include an age less than 65 years old.  The bleeding index is 'low risk'.  Negative CHADS2 values include Age > 49 years old.  Anticoagulation responsible provider: Jens Som MD, Arlys John.  INR POC: 1.9.  Cuvette Lot#: [id number].  Exp: 04/2010.    Anticoagulation Management Assessment/Plan:      The patient's current anticoagulation dose is Warfarin sodium 5 mg tabs: Marland Kitchen  The next INR is due 03/07/2009.  Anticoagulation instructions were given to patient.  Results were reviewed/authorized by Shelby Dubin, PharmD, BCPS, CPP.  She was notified by Shelby Dubin PharmD, BCPS, CPP.         Prior Anticoagulation Instructions: INR  1.6  Continue on 10mg  daily.  Continue Lovenox twice daily and Recheck  on Monday.    Current Anticoagulation Instructions: INR 1.9  Continue 10 mg daily.  Continue lovenox through Wednesday morning.    Recheck Wednesday morning.

## 2010-02-26 NOTE — Medication Information (Signed)
Summary: rov/ewj  Anticoagulant Therapy  Managed by: Cloyde Reams, RN, BSN Referring MD: Rene Paci, MD PCP: Newt Lukes MD Supervising MD: Jens Som MD, Arlys John Indication 1: DVT Lab Used: [facility] [place] Site: [address] INR POC 2.2 INR RANGE 2.0 - 3.0  Dietary changes: no    Health status changes: no    Bleeding/hemorrhagic complications: no    Recent/future hospitalizations: no    Any changes in medication regimen? no    Recent/future dental: no  Any missed doses?: no       Is patient compliant with meds? yes       Allergies (verified): 1)  ! Codeine  Anticoagulation Management History:      The patient is taking warfarin and comes in today for a routine follow up visit.  Negative risk factors for bleeding include an age less than 57 years old.  The bleeding index is 'low risk'.  Negative CHADS2 values include Age > 45 years old.  Anticoagulation responsible provider: Jens Som MD, Arlys John.  INR POC: 2.2.  Cuvette Lot#: [id number].  Exp: 04/2010.    Anticoagulation Management Assessment/Plan:      The patient's current anticoagulation dose is Warfarin sodium 5 mg tabs: Marland Kitchen  The next INR is due 03/14/2009.  Anticoagulation instructions were given to patient.  Results were reviewed/authorized by Cloyde Reams, RN, BSN.  She was notified by Cloyde Reams RN.         Prior Anticoagulation Instructions: INR 1.9  Continue 10 mg daily.  Continue lovenox through Wednesday morning.    Recheck Wednesday morning.    Current Anticoagulation Instructions: INR 2.2  Stop Lovenox and continue taking 10mg  daily.  Recheck in 1 week.

## 2010-02-26 NOTE — Assessment & Plan Note (Signed)
Summary: new [employer] pt-r leg pain/clot?--pkg/off-stc   Vital Signs:  Patient profile:   55 year old female Height:      69.5 inches (176.53 cm) Weight:      239.4 pounds (108.82 kg) BMI:     34.97 O2 Sat:      97 % on Room air Temp:     98.5 degrees F (36.94 degrees C) oral Pulse rate:   82 / minute BP sitting:   118 / 78  (left arm) Cuff size:   large  Vitals Entered By: Orlan Leavens (February 09, 2009 11:39 AM)  O2 Flow:  Room air CC: New patient. Having (R) leg pain, calf area x's 2 days Is Patient Diabetic? No Pain Assessment Patient in pain? yes     Location: (R) leg Type: aching   Primary Care Provider:  Newt Lukes MD  CC:  New patient. Having (R) leg pain and calf area x's 2 days.  History of Present Illness: new pt to me and our practice - here to establish care -  concerned about swelling in RLE - symptoms started 2 days ago - started as "cramp" in calf, siilar symptoms to prior DVT 7/09 hx recurrent DVT x 2, off coumadin since 04/12/2008 has seen heme for same - FVL defic noted - no CP or SOB pain/cramp has improved with ibuprofen, almost none now min increase in usual swelling of legs (common pre period and d/t prior DVTS = phlebitis)  Preventive Screening-Counseling & Management  Alcohol-Tobacco     Smoking Status: quit  Current Medications (verified): 1)  Advair Diskus 100-50 Mcg/dose Aepb (Fluticasone-Salmeterol) .Marland Kitchen.. 1 Puff Two Times A Day 2)  Pepcid Complete 10-800-165 Mg Chew (Famotidine-Ca Carb-Mag Hydrox) .... Take 1 By Mouth Qd  Allergies (verified): 1)  ! 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 I have reviewed all other systems and they were negative.   Physical Exam  General:  overweight-appearing.  alert, well-developed, well-nourished, and cooperative to examination.    Eyes:  vision grossly intact; pupils equal, round and reactive to light.  conjunctiva and lids normal.    Lungs:  normal respiratory effort, no intercostal retractions or use of accessory muscles; normal breath sounds bilaterally - no crackles and no wheezes.    Heart:  normal rate, regular rhythm, no murmur, and no rub. BLE with chronic 1+ edema. normal DP pulses and normal cap refill in all 4 extremities    Abdomen:  soft, non-tender, normal bowel sounds, no distention; no masses and no appreciable hepatomegaly or splenomegaly.   Msk:  No deformity or scoliosis noted of thoracic or lumbar spine.   Neurologic:  alert & oriented X3 and cranial nerves II-XII symetrically intact.  strength normal in all extremities, sensation intact to light touch, and gait normal. speech fluent without dysarthria or aphasia; follows commands with good comprehension.  Skin:  varicosities and redness ble distally r>l, not warm to touch .. varicose superficial clot appreciated on right calf, nontender and no ulceration Psych:  Oriented X3, memory intact for recent and remote, normally interactive, good eye contact, not anxious appearing, not depressed appearing, and not agitated.      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 Take 1 by mouth qd  Problem # 4:  ASTHMA (ICD-493.90)  Her updated medication list for this problem includes:    Advair Diskus 100-50 Mcg/dose Aepb (Fluticasone-salmeterol) .Marland Kitchen... 1 puff two times a day  Problem # 5:  THYROID NODULE (ICD-241.0) will send for records from [facility] re: prior US and w/u as pt reports she is due for f/u next mo on same and plans to follow here  Complete Medication List: 1)  Advair Diskus 100-50 Mcg/dose Aepb (Fluticasone-salmeterol) .Marland Kitchen.. 1 puff two times a day 2)  Pepcid Complete 10-800-165 Mg Chew (Famotidine-ca carb-mag hydrox) .... Take 1 by mouth qd 3)  Lovenox 120 Mg/0.35ml Soln (Enoxaparin sodium) .... Inject 110 mg subcutaneously into abdomen two times a day.  Patient Instructions: 1)  it was good to see you today.  2)  will arrange for ultrasound/doppler to exclude recurrent clot in your leg - further managment to depend on these results: if no clot, may continue ibuprofen as needed with elevation; if +clot, you will be contacted with further instructions - 3)  will send for copy of records from [place] to prepare for future followup here as due next month - 4)  Please schedule a follow-up appointment in Feb 2011 as you are due, call sooner if problems.    Immunization History:  Tetanus/Td Immunization History:    Tetanus/Td:  historical (10/16/2008)    Mammogram  Procedure date:  01/28/2007  Findings:      No specific mammographic evidence of malignancy.    Pap  Smear  Procedure date:  01/28/2007  Findings:      Interpretation/Result:Negative for intraepithelial Lesion or Malignancy.

## 2010-02-26 NOTE — Progress Notes (Signed)
Summary: Pt?  Phone Note Call from Patient Call back at Work Phone [phone]   Caller: Patient Summary of Call: pt caled stating that she will be out of Lovenox tomorrow and does not have an appt with Hemotology until 03/06/2009. pt does not know if she is to continue with Lovenox until appt or if she was to changed to another medication. I did not seen any mention of med change. please advise (should she have refills on Rx from 02/09/2009 to last until appt?) Initial call taken by: Margaret Pyle, CMA,  February 15, 2009 9:52 AM  Follow-up for Phone Call        yes she is to continue Lovenox as ongoing until appt with heme - new rx competed today (listed historically only) - please let pt know same - ?if you can call in to her pharm or if pt needs to pick up in person? - also i have called heme and left phone msg with dr. Alver Fisher scheduler asking to move appt sooner than 2/8 if at all possible - i asked for their office to return call at [phone] to advise on this appt date - Follow-up by: Newt Lukes MD,  February 15, 2009 10:03 AM  Additional Follow-up for Phone Call Additional follow up Details #1::        pt informed and stated that she does have 1 refill on her current Rx but if she needs more before her appt she will call back. Additional Follow-up by: Margaret Pyle, CMA,  February 15, 2009 10:34 AM    Prescriptions: LOVENOX 120 MG/0.8ML SOLN (ENOXAPARIN SODIUM) Inject 110 mg subcutaneously into abdomen two times a day.  #30 x 1   Entered and Authorized by:   Newt Lukes MD   Signed by:   Newt Lukes MD on 02/15/2009   Method used:   Historical   RxID:   [id number]

## 2010-02-26 NOTE — Medication Information (Signed)
Summary: [facility]  Anticoagulant Therapy  Managed by: Shelby Dubin, PharmD, BCPS, CPP Referring MD: Rene Paci, MD Supervising MD: Gala Romney MD, Reuel Boom Indication 1: DVT Lab Used: [facility] [place] Site: [address] INR RANGE 2.0 - 3.0  Dietary changes: no    Health status changes: no    Bleeding/hemorrhagic complications: no    Recent/future hospitalizations: no    Any changes in medication regimen? no    Recent/future dental: no  Any missed doses?: no       Is patient compliant with meds? yes      Comments: LE Doppler reveals: probable acute RLE DVT  Current Medications (verified): 1)  Advair Diskus 100-50 Mcg/dose Aepb (Fluticasone-Salmeterol) .Marland Kitchen.. 1 Puff Two Times A Day 2)  Pepcid Complete 10-800-165 Mg Chew (Famotidine-Ca Carb-Mag Hydrox) .... Take 1 By Mouth Qd 3)  Lovenox 120 Mg/0.58ml Soln (Enoxaparin Sodium) .... Inject 110 Mg Subcutaneously Into Abdomen Two Times A Day.  Allergies (verified): 1)  ! Codeine  Anticoagulation Management History:      The patient comes in today for her initial visit for anticoagulation therapy.  Negative risk factors for bleeding include an age less than 55 years old.  The bleeding index is 'low risk'.  Negative CHADS2 values include Age > 60 years old.  Anticoagulation responsible provider: Bensimhon MD, Reuel Boom.    Anticoagulation Management Assessment/Plan:      The next INR is due 02/16/2009.  Anticoagulation instructions were given to patient.  Results were reviewed/authorized by Shelby Dubin, PharmD, BCPS, CPP.  She was notified by Shelby Dubin PharmD, BCPS, CPP.         Current Anticoagulation Instructions: Begin lovenox 110 mg injection into abdomen two times a day. Alternate sites and sides.    Lovenox 100 mg subcutaneously administered in RLE at 1630.  No complaints or issues.  Injection technique reviewed with patient.    Plan:  patient will begin lovenox 110 mg subcutaneously two times a  day for the next week while a decision is made by Drs. Shedad and [employer] regarding potential re-initiation of warfarin therapy.  Patient verbalizes understanding that she may have to take lovenox for a longer period because we are not initiating warfarin today.   Prescriptions: LOVENOX 120 MG/0.8ML SOLN (ENOXAPARIN SODIUM) Inject 110 mg subcutaneously into abdomen two times a day.  #14 x 1   Entered by:   Shelby Dubin PharmD, BCPS, CPP   Authorized by:   Newt Lukes MD   Signed by:   Shelby Dubin PharmD, BCPS, CPP on 02/09/2009   Method used:   Faxed to ...       [facility] (retail)       [address]       [place], [place]  [address]  [place]       Ph: [phone]       Fax: [phone]   RxID:   [id number]

## 2010-02-26 NOTE — Medication Information (Signed)
Summary: rov/ewj  Anticoagulant Therapy  Managed by: Cloyde Reams, RN, BSN Referring MD: Rene Paci, MD PCP: Newt Lukes MD Supervising MD: Ladona Ridgel MD, Sharlot Gowda Indication 1: DVT Lab Used: [facility] [place] Site: [address] INR POC 3.0 INR RANGE 2.0 - 3.0  Dietary changes: no    Health status changes: no    Bleeding/hemorrhagic complications: no    Recent/future hospitalizations: no    Any changes in medication regimen? no    Recent/future dental: no  Any missed doses?: no       Is patient compliant with meds? yes       Allergies (verified): 1)  ! Codeine  Anticoagulation Management History:      The patient is taking warfarin and comes in today for a routine follow up visit.  Negative risk factors for bleeding include an age less than 65 years old.  The bleeding index is 'low risk'.  Negative CHADS2 values include Age > 83 years old.  Anticoagulation responsible provider: Ladona Ridgel MD, Sharlot Gowda.  INR POC: 3.0.  Exp: 04/2010.    Anticoagulation Management Assessment/Plan:      The patient's current anticoagulation dose is Warfarin sodium 5 mg tabs: Marland Kitchen  The target INR is 2.0-3.0.  The next INR is due 04/02/2009.  Anticoagulation instructions were given to patient.  Results were reviewed/authorized by Cloyde Reams, RN, BSN.  She was notified by Cloyde Reams RN.         Prior Anticoagulation Instructions: INR 3.1  Start taking 10mg  daily except 5mg  on Wednesdays.  Recheck in 1 week.    Current Anticoagulation Instructions: INR 3.0  Continue on same dosage 10mg  daily except 5mg  on Wednesdays. Recheck in 10 days.

## 2010-02-26 NOTE — Miscellaneous (Signed)
Summary: Orders Update  Clinical Lists Changes  Orders: Added new Test order of Venous Duplex Lower Extremity (Venous Duplex Lower) - Signed 

## 2010-02-26 NOTE — Medication Information (Signed)
Summary: rov/ewj  Anticoagulant Therapy  Managed by: Eda Keys, PharmD Referring MD: Rene Paci, MD PCP: Newt Lukes MD Supervising MD: Ladona Ridgel MD, Sharlot Gowda Indication 1: DVT Lab Used: [facility] [place] Site: [address] INR POC 3.1 INR RANGE 2.0 - 3.0  Dietary changes: no    Health status changes: no    Bleeding/hemorrhagic complications: no    Recent/future hospitalizations: no    Any changes in medication regimen? no    Recent/future dental: yes     Details: wisdom tooth extraction september 27  Any missed doses?: yes     Details: missed august 30th    Allergies: 1)  ! Codeine  Anticoagulation Management History:      The patient is taking warfarin and comes in today for a routine follow up visit.  Negative risk factors for bleeding include an age less than 34 years old.  The bleeding index is 'low risk'.  Negative CHADS2 values include Age > 58 years old.  Anticoagulation responsible Marielle Mantione: Ladona Ridgel MD, Sharlot Gowda.  INR POC: 3.1.  Cuvette Lot#: [id number].  Exp: 11/2010.    Anticoagulation Management Assessment/Plan:      The patient's current anticoagulation dose is Warfarin sodium 5 mg tabs: Marland Kitchen  The target INR is 2.0-3.0.  The next INR is due 11/01/2009.  Anticoagulation instructions were given to patient.  Results were reviewed/authorized by Eda Keys, PharmD.  She was notified by Kennieth Francois.         Prior Anticoagulation Instructions: INR 2.4  Continue on same dosage 2 tablets daily except 1 tablet on Wednesdays.  Recheck in 4 weeks.    Current Anticoagulation Instructions: INR 3.1  Take one tablet today, then resume two tablets every day except one tablet on Wednesday. We will see you in four weeks.

## 2010-02-26 NOTE — Progress Notes (Signed)
Summary: Referral  Phone Note Other Incoming   Caller: Dr. Clelia Croft Summary of Call: pt needs referral to coumadin clinic for DVT Initial call taken by: Margaret Pyle, CMA,  February 21, 2009 4:20 PM  Follow-up for Phone Call        i d/w dr. Clelia Croft pt case: 2nd spontanous DVT with FVL - rec anticoag for life, min 6 mos - starting coumadin today (in heme with OV), also on full dose lovenox --  pt will cont to follow at [facility] for same - i will let CC know and request followup for mgmt of same - thanks Follow-up by: Newt Lukes MD,  February 21, 2009 4:47 PM  Additional Follow-up for Phone Call Additional follow up Details #1::        Discussed pt. case with Dr Jimmey Ralph and we will continue Lovenox 110mg s Q12hrs and give 7.5mg s today and Friday, then resume 5mg s daily that she started on 126/11. Follow up on 02/26/09 for INR check. [facility].  Additional Follow-up by: Bethena Midget, RN, BSN,  February 22, 2009 10:51 AM

## 2010-02-26 NOTE — Medication Information (Signed)
Summary: rov/tm  Anticoagulant Therapy  Managed by: Weston Brass, PharmD Referring MD: Rene Paci, MD PCP: Newt Lukes MD Supervising MD: Juanda Chance MD, Ranier Coach Indication 1: DVT Lab Used: [facility] [place] Site: [address] INR POC 3.2 INR RANGE 2.0 - 3.0  Dietary changes: no    Health status changes: no    Bleeding/hemorrhagic complications: no    Recent/future hospitalizations: no    Any changes in medication regimen? no    Recent/future dental: yes     Details: having teeth cleaned and root canal   Any missed doses?: yes     Details: missed one dose about 2 weeks ago but increased her dose the next day   Is patient compliant with meds? yes       Allergies: 1)  ! Codeine  Anticoagulation Management History:      The patient is taking warfarin and comes in today for a routine follow up visit.  Negative risk factors for bleeding include an age less than 26 years old.  The bleeding index is 'low risk'.  Negative CHADS2 values include Age > 29 years old.  Anticoagulation responsible provider: Juanda Chance MD, Smitty Cords.  INR POC: 3.2.  Cuvette Lot#: [id number].  Exp: 05/2010.    Anticoagulation Management Assessment/Plan:      The patient's current anticoagulation dose is Warfarin sodium 5 mg tabs: Marland Kitchen  The target INR is 2.0-3.0.  The next INR is due 06/04/2009.  Anticoagulation instructions were given to patient.  Results were reviewed/authorized by Weston Brass, PharmD.  She was notified by Weston Brass PharmD.         Prior Anticoagulation Instructions: INR 2.9 Continue 10mg  everyday except 5mg s on Wednesdays. Recheck in 4 weeks.   Current Anticoagulation Instructions: INR 3.2  Take 1 tablet today then continue same dose of 2 tablets every day except 1 tablet on Wednesday

## 2010-02-26 NOTE — Medication Information (Signed)
Summary: rov/sp  Anticoagulant Therapy  Managed by: Weston Brass, PharmD Referring MD: Rene Paci, MD PCP: Newt Lukes MD Supervising MD: Ladona Ridgel MD, Sharlot Gowda Indication 1: DVT Lab Used: [facility] [place] Site: [address] INR POC 2.0 INR RANGE 2.0 - 3.0  Dietary changes: yes       Details: has been eating more broccoli than she usually does  Health status changes: no    Bleeding/hemorrhagic complications: no    Recent/future hospitalizations: no    Any changes in medication regimen? no    Recent/future dental: yes     Details: wisdom tooth to be pulled, no date scheduled yet  Any missed doses?: no       Is patient compliant with meds? yes       Allergies: 1)  ! Codeine  Anticoagulation Management History:      The patient is taking warfarin and comes in today for a routine follow up visit.  Negative risk factors for bleeding include an age less than 90 years old.  The bleeding index is 'low risk'.  Negative CHADS2 values include Age > 59 years old.  Anticoagulation responsible provider: Ladona Ridgel MD, Sharlot Gowda.  INR POC: 2.0.  Cuvette Lot#: [id number].  Exp: 09/2010.    Anticoagulation Management Assessment/Plan:      The patient's current anticoagulation dose is Warfarin sodium 5 mg tabs: Marland Kitchen  The target INR is 2.0-3.0.  The next INR is due 08/30/2009.  Anticoagulation instructions were given to patient.  Results were reviewed/authorized by Weston Brass, PharmD.  She was notified by Dillard Cannon.         Prior Anticoagulation Instructions: INR 2.6  Continue same dose of 2 tablets every day except 1 tablet on Wednesday  Current Anticoagulation Instructions: INR 2.0  Take 2.5 tabs (12.5mg ) today.  Then continue same regimen of 1 tab (5mg ) on Wednesday and 2 tabs (10mg ) all other days.

## 2010-02-26 NOTE — Medication Information (Signed)
Summary: rov/ewj  Anticoagulant Therapy  Managed by: Cloyde Reams, RN, BSN Referring MD: Rene Paci, MD PCP: Newt Lukes MD Supervising MD: Johney Frame MD, Fayrene Fearing Indication 1: DVT Lab Used: [facility] [place] Site: [address] INR POC 3.1 INR RANGE 2.0 - 3.0  Dietary changes: no    Health status changes: no    Bleeding/hemorrhagic complications: no    Recent/future hospitalizations: no    Any changes in medication regimen? no    Recent/future dental: no  Any missed doses?: no       Is patient compliant with meds? yes       Allergies: 1)  ! Codeine  Anticoagulation Management History:      The patient is taking warfarin and comes in today for a routine follow up visit.  Negative risk factors for bleeding include an age less than 72 years old.  The bleeding index is 'low risk'.  Negative CHADS2 values include Age > 12 years old.  Anticoagulation responsible provider: Carver Murakami MD, Fayrene Fearing.  INR POC: 3.1.  Cuvette Lot#: [id number].  Exp: 04/2010.    Anticoagulation Management Assessment/Plan:      The patient's current anticoagulation dose is Warfarin sodium 5 mg tabs: Marland Kitchen  The next INR is due 03/21/2009.  Anticoagulation instructions were given to patient.  Results were reviewed/authorized by Cloyde Reams, RN, BSN.  She was notified by Cloyde Reams RN.         Prior Anticoagulation Instructions: INR 2.2  Stop Lovenox and continue taking 10mg  daily.  Recheck in 1 week.    Current Anticoagulation Instructions: INR 3.1  Start taking 10mg  daily except 5mg  on Wednesdays.  Recheck in 1 week.

## 2010-02-26 NOTE — Progress Notes (Signed)
Summary: Lovenox refill  Phone Note From Pharmacy   Caller: [facility] Summary of Call: Patient did need a refill of her Lovenox. Sent 1 only after reviewing previous phone note. Initial call taken by: Lucious Groves,  February 15, 2009 11:16 AM

## 2010-02-26 NOTE — Medication Information (Signed)
Summary: rov/cs  Anticoagulant Therapy  Managed by: Weston Brass, PharmD Referring MD: Rene Paci, MD PCP: Newt Lukes MD Supervising MD: Johney Frame MD, Fayrene Fearing Indication 1: DVT Lab Used: [facility] [place] Site: [address] INR POC 2.8 INR RANGE 2.0 - 3.0  Dietary changes: no    Health status changes: yes       Details: Pt has had a cold the last week, which is mostly resolved.    Bleeding/hemorrhagic complications: no    Recent/future hospitalizations: no    Any changes in medication regimen? yes       Details: Took coricidin HBP (cough and cold) for a few days for cold symtpoms  Recent/future dental: no  Any missed doses?: no       Is patient compliant with meds? yes       Allergies: 1)  ! Codeine  Anticoagulation Management History:      The patient is taking warfarin and comes in today for a routine follow up visit.  Negative risk factors for bleeding include an age less than 4 years old.  The bleeding index is 'low risk'.  Negative CHADS2 values include Age > 52 years old.  Anticoagulation responsible provider: Allred MD, Fayrene Fearing.  INR POC: 2.8.  Cuvette Lot#: [id number].  Exp: 11/2010.    Anticoagulation Management Assessment/Plan:      The patient's current anticoagulation dose is Warfarin sodium 5 mg tabs: Marland Kitchen  The target INR is 2.0-3.0.  The next INR is due 11/29/2009.  Anticoagulation instructions were given to patient.  Results were reviewed/authorized by Weston Brass, PharmD.  She was notified by Haynes Hoehn, PharmD Candidate.         Prior Anticoagulation Instructions: INR 3.1  Take one tablet today, then resume two tablets every day except one tablet on Wednesday. We will see you in four weeks.   Current Anticoagulation Instructions: INR 2.8  Continue Coumadin as scheduled:  2 tablets everyday of the week except 1 tablet on Wednesday.   Return in 4 weeks.

## 2010-02-26 NOTE — Medication Information (Signed)
Summary: rov/ln  Anticoagulant Therapy  Managed by: Cloyde Reams, RN, BSN Referring MD: Rene Paci, MD PCP: Newt Lukes MD Supervising MD: Clifton James MD, Cristal Deer Indication 1: DVT Lab Used: [facility] [place] Site: [address] INR POC 2.4 INR RANGE 2.0 - 3.0  Dietary changes: no    Health status changes: no    Bleeding/hemorrhagic complications: no    Recent/future hospitalizations: no    Any changes in medication regimen? no    Recent/future dental: no  Any missed doses?: yes     Details: Missed 1 dosage 7/20.    Is patient compliant with meds? yes       Allergies: 1)  ! Codeine  Anticoagulation Management History:      The patient is taking warfarin and comes in today for a routine follow up visit.  Negative risk factors for bleeding include an age less than 36 years old.  The bleeding index is 'low risk'.  Negative CHADS2 values include Age > 1 years old.  Anticoagulation responsible provider: Clifton James MD, Cristal Deer.  INR POC: 2.4.  Cuvette Lot#: [id number].  Exp: 10/2010.    Anticoagulation Management Assessment/Plan:      The patient's current anticoagulation dose is Warfarin sodium 5 mg tabs: Marland Kitchen  The target INR is 2.0-3.0.  The next INR is due 09/27/2009.  Anticoagulation instructions were given to patient.  Results were reviewed/authorized by Cloyde Reams, RN, BSN.  She was notified by Cloyde Reams RN.         Prior Anticoagulation Instructions: INR 2.0  Take 2.5 tabs (12.5mg ) today.  Then continue same regimen of 1 tab (5mg ) on Wednesday and 2 tabs (10mg ) all other days.  Current Anticoagulation Instructions: INR 2.4  Continue on same dosage 2 tablets daily except 1 tablet on Wednesdays.  Recheck in 4 weeks.

## 2010-02-27 ENCOUNTER — Encounter: Payer: Self-pay | Admitting: Internal Medicine

## 2010-02-27 ENCOUNTER — Ambulatory Visit (INDEPENDENT_AMBULATORY_CARE_PROVIDER_SITE_OTHER): Payer: Private Health Insurance - Indemnity | Admitting: Internal Medicine

## 2010-02-27 DIAGNOSIS — R002 Palpitations: Secondary | ICD-10-CM

## 2010-02-27 DIAGNOSIS — R0602 Shortness of breath: Secondary | ICD-10-CM

## 2010-02-27 DIAGNOSIS — J45909 Unspecified asthma, uncomplicated: Secondary | ICD-10-CM

## 2010-02-27 DIAGNOSIS — F411 Generalized anxiety disorder: Secondary | ICD-10-CM | POA: Insufficient documentation

## 2010-02-28 NOTE — Assessment & Plan Note (Signed)
Summary: BREATHING PROBLEMS/ WENT TO ER/ [facility]'S PT/NWS   Vital Signs:  Patient profile:   55 year old female Height:      69.5 inches Weight:      235 pounds BMI:     34.33 Temp:     98.3 degrees F oral Pulse rate:   84 / minute Pulse rhythm:   regular Resp:     16 per minute BP sitting:   112 / 78  (left arm) Cuff size:   regular  Vitals Entered By: Lanier Prude, CMA(AAMA) (February 22, 2010 4:21 PM) CC: SOB Is Patient Diabetic? No Comments pt was [facility] above [facility] and was given Albuterol and Lovenox.   Primary Care Provider:  Newt Lukes MD  CC:  SOB.  History of Present Illness: The patient presents for a post-hospital/ER visit on 1/26 for SOB x 1 wk. She had a  ST x 1 wk, sneezing. She had a neg chest CT, EKG and labs C/o wart  Current Medications (verified): 1)  Advair Diskus 100-50 Mcg/dose Aepb (Fluticasone-Salmeterol) .Marland Kitchen.. 1 Puff Two Times A Day 2)  Pepcid Complete 10-800-165 Mg Chew (Famotidine-Ca Carb-Mag Hydrox) .... Take 1 By Mouth Qd 3)  Warfarin Sodium 5 Mg Tabs (Warfarin Sodium)  Allergies (verified): 1)  ! 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  The patient denies fever, chest pain, syncope, and abdominal pain.    Physical Exam  General:  overweight-appearing.  alert, well-developed,  and cooperative to examination.    Head:  Normocephalic and  atraumatic without obvious abnormalities. No apparent alopecia or balding. Eyes:  No corneal or conjunctival inflammation noted. EOMI. Perrla. Ears:  B wax Nose:  Swollen mucosa Mouth:  Erythematous throat and intranasal mucosa c/w URI  Neck:  mild fullness anteriorbilaterally  without discrete nodules appreciated - nontender Lungs:  normal respiratory effort, no intercostal retractions or use of accessory muscles; normal breath sounds bilaterally - no crackles and no wheezes.    Heart:  normal rate, regular rhythm, no murmur, and no rub. BLE with chronic 1+ edema. normal DP pulses and normal cap refill in all 4 extremities    Abdomen:  soft, non-tender, normal bowel sounds, no distention; no masses and no appreciable hepatomegaly or splenomegaly.   Extremities:  B w/o edema Skin:  R forearm wart 6 mm Psych:  Oriented X3, memory intact for recent and remote, normally interactive, good eye contact, not anxious appearing, not depressed appearing, and not agitated.      Impression & Recommendations:  Problem # 1:  DYSPNEA (ICD-786.05) due to #2 vs other Assessment New Likely asthma exacerbation is triggered by a URI and pet allergies Pulm appt w/Dr Sherene Sires is pending   Problem # 2:  ASTHMA (ICD-493.90) Assessment: Deteriorated  The following medications were removed from the medication list:    Advair Diskus 100-50 Mcg/dose Aepb (Fluticasone-salmeterol) .Marland Kitchen... 1 puff two  times a day Her updated medication list for this problem includes:    Prednisone 10 Mg Tabs (Prednisone) .Marland Kitchen... Take 40mg  qd for 3 days, then 20 mg qd for 3 days, then 10mg  qd for 6 days, then stop. take pc.    Singulair 10 Mg Tabs (Montelukast sodium) .Marland Kitchen... 1 by mouth qd    Symbicort 160-4.5 Mcg/act Aero (Budesonide-formoterol fumarate) .Marland Kitchen... 2 inh two times a day    Proair Hfa 108 (90 Base) Mcg/act Aers (Albuterol sulfate) .Marland Kitchen... 2 inh qid as needed  Orders: Nebulizer Tx (16109)  Problem # 3:  WART, VIRAL (ICD-078.10) R  arm Assessment: New Procedure: cryo Indication: wart Risks incl. scar(s), incomplete removal, ect.  and benefits discussed   1   lesion(s) on R forearm was/were treated with liqid N2 in usual fasion.  Tolerated well. Compl. none. Wound care instructions given.   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   Complete Medication List: 1)  Pepcid Complete 10-800-165 Mg Chew (Famotidine-ca carb-mag hydrox) .... Take 1 by mouth qd 2)  Warfarin Sodium 5 Mg Tabs (Warfarin sodium) 3)  Prednisone 10 Mg Tabs (Prednisone) .... Take 40mg  qd for 3 days, then 20 mg qd for 3 days, then 10mg  qd for 6 days, then stop. take pc. 4)  Singulair 10 Mg Tabs (Montelukast sodium) .Marland Kitchen.. 1 by mouth qd 5)  Symbicort 160-4.5 Mcg/act Aero (Budesonide-formoterol fumarate) .... 2 inh two times a day 6)  Proair Hfa 108 (90 Base) Mcg/act Aers (Albuterol sulfate) .... 2 inh qid as needed  Other Orders: Wart Destruct <14 (17110)  Patient Instructions: 1)  Use the Sinus rinse as needed  2)  Please schedule a follow-up appointment in 2 weeks Dr Trish Mage 3)  Call if you are not better in a reasonable amount of time or if worse. Go to ER if feeling really bad!  Prescriptions: PROAIR HFA 108 (90 BASE) MCG/ACT AERS (ALBUTEROL SULFATE) 2 inh qid as needed  #3 x 3   Entered and Authorized by:   Tresa Garter MD   Signed by:   Tresa Garter MD on 02/22/2010   Method used:   Print then Give to Patient   RxID:   [id number] SYMBICORT 160-4.5 MCG/ACT AERO (BUDESONIDE-FORMOTEROL FUMARATE) 2 inh two times a day  #1 x 3   Entered and Authorized by:   Tresa Garter MD   Signed by:   Tresa Garter MD on 02/22/2010   Method used:   Print then Give to Patient   RxID:   [id number] SINGULAIR 10 MG TABS  (MONTELUKAST SODIUM) 1 by mouth qd  #90 x 3   Entered and Authorized by:   Tresa Garter MD   Signed by:   Tresa Garter MD on 02/22/2010   Method used:   Print then Give to Patient   RxID:   [id number] PREDNISONE 10 MG TABS (PREDNISONE) Take 40mg  qd for 3 days, then 20 mg qd for 3 days, then 10mg  qd for 6 days, then stop. Take pc.  #24 x 1   Entered and Authorized by:   Tresa Garter MD   Signed by:   Tresa Garter MD on 02/22/2010   Method used:   Print then Give to Patient   RxID:   [id number]  Orders Added: 1)  Est. Patient Level IV [45409] 2)  Wart Destruct <14 [17110] 3)  Nebulizer Tx [81191]

## 2010-02-28 NOTE — Medication Information (Signed)
Summary: rov/sp  Anticoagulant Therapy  Managed by: Eda Keys, PharmD Referring MD: Rene Paci, MD PCP: Newt Lukes MD Supervising MD: Jens Som MD, Arlys John Indication 1: DVT Lab Used: [facility] [place] Site: [address] INR POC 2.2 INR RANGE 2.0 - 3.0  Dietary changes: no    Health status changes: no    Bleeding/hemorrhagic complications: no    Recent/future hospitalizations: no    Any changes in medication regimen? no    Recent/future dental: no  Any missed doses?: no       Is patient compliant with meds? yes       Allergies: 1)  ! Codeine  Anticoagulation Management History:      The patient is taking warfarin and comes in today for a routine follow up visit.  Negative risk factors for bleeding include an age less than 74 years old.  The bleeding index is 'low risk'.  Negative CHADS2 values include Age > 51 years old.  Anticoagulation responsible provider: Jens Som MD, Arlys John.  INR POC: 2.2.  Cuvette Lot#: [id number].  Exp: 02/2011.    Anticoagulation Management Assessment/Plan:      The patient's current anticoagulation dose is Warfarin sodium 5 mg tabs: Marland Kitchen  The target INR is 2.0-3.0.  The next INR is due 02/21/2010.  Anticoagulation instructions were given to patient.  Results were reviewed/authorized by Eda Keys, PharmD.  She was notified by Eda Keys.         Prior Anticoagulation Instructions: INR 2.4  Continue same dose of 2 tablets every day except 1 tablet on Wednesday.  Recheck INR in 4 weeks.   Current Anticoagulation Instructions: INR 2.2  Continue current dosing schedule of 1 tablet on Wednesday and 2 tablets all other days.  Return to clinic in 4 weeks.

## 2010-02-28 NOTE — Medication Information (Signed)
Summary: rov/eac  Anticoagulant Therapy  Managed by: Weston Brass, PharmD Referring MD: Rene Paci, MD PCP: Newt Lukes MD Supervising MD: Myrtis Ser MD, Tinnie Gens Indication 1: DVT Lab Used: [facility] [place] Site: [address] INR POC 2.5 INR RANGE 2.0 - 3.0  Dietary changes: no    Health status changes: no    Bleeding/hemorrhagic complications: no    Recent/future hospitalizations: no    Any changes in medication regimen? no    Recent/future dental: no  Any missed doses?: no       Is patient compliant with meds? yes       Allergies: 1)  ! Codeine  Anticoagulation Management History:      The patient is taking warfarin and comes in today for a routine follow up visit.  Negative risk factors for bleeding include an age less than 73 years old.  The bleeding index is 'low risk'.  Negative CHADS2 values include Age > 26 years old.  Anticoagulation responsible provider: Myrtis Ser MD, Tinnie Gens.  INR POC: 2.5.  Cuvette Lot#: [id number].  Exp: 01/2011.    Anticoagulation Management Assessment/Plan:      The patient's current anticoagulation dose is Warfarin sodium 5 mg tabs: Marland Kitchen  The target INR is 2.0-3.0.  The next INR is due 03/21/2010.  Anticoagulation instructions were given to patient.  Results were reviewed/authorized by Weston Brass, PharmD.  She was notified by Stephannie Peters, PharmD Candidate .         Prior Anticoagulation Instructions: INR 2.2  Continue current dosing schedule of 1 tablet on Wednesday and 2 tablets all other days.  Return to clinic in 4 weeks.    Current Anticoagulation Instructions: INR 2.5  Coumadin 5 mg tablets - Continue 2 tablets every day except 1 tablet on Wednesdays

## 2010-02-28 NOTE — Progress Notes (Signed)
Summary: calling about INR  Phone Note Call from Patient Call back at Work Phone [phone]   Caller: Patient Summary of Call: Pt had to go to ed last night and have question about having her coumadin checked last night INR was 1.8 Initial call taken by: Judie Grieve,  February 22, 2010 8:50 AM  Follow-up for Phone Call        Pt was given Lovenox injection in ER.  No evidence of PE.  Diagnosed with asthma/bronchitis.   Instructed to take 2 1/2 tablets today then resume same dose.  Will f/u as previously scheduled.  Follow-up by: Weston Brass PharmD,  February 22, 2010 9:30 AM

## 2010-03-06 NOTE — Assessment & Plan Note (Signed)
Summary: BREATHING PROBLEMS/ NOT FEELING WELL/NWS   Vital Signs:  Patient profile:   55 year old female Height:      69.5 inches (176.53 cm) Weight:      235 pounds (106.82 kg) O2 Sat:      97 % on Room air Temp:     98.7 degrees F (37.06 degrees C) oral Pulse rate:   87 / minute BP sitting:   112 / 82  (left arm) Cuff size:   large  Vitals Entered By: Orlan Leavens RMA (February 27, 2010 2:36 PM)  O2 Flow:  Room air CC: SOB Is Patient Diabetic? No Pain Assessment Patient in pain? no      Comments Pt states she went to Er on last thurs had episode of being SOB. Came and saw Dr. Posey Rea on following Fri was rx prednisobe pack, singular and inhalers. Pt states she is still not feeling herself. Yesterday she took her dog for a walk became very SOB, clammy feeling, denies any chest pain, but did feel heart flutter   Primary Care Provider:  Newt Lukes MD  CC:  SOB.  History of Present Illness: The patient presents for continued SOB - seen in ED 1/26 and OV 1/27 for same ER visit on 1/26 reviewed: neg CXR, chest CT, EKG and labs OV with AVP 1/27 - pred pulse x 12d and change in asthma meds  now c/o cont SOB but less wheeze and doe - feels anxious, palpitations and nervousness - unable to sleep (acute change) no fever, cough, weight change, night sweats  reviewed chronic med issues: 1) hx recurrent DVT x3 with FVL defic - last dx 1/11 has seen heme in f/u> rec life long coumadin no CP or SOB persisting mild swelling of RLE  2) thyroid nodules - dx [place] 8/10 -02/2009 [place] w/o change - rec annual f/u new palp x 1 week as above - but no weight changes no trouble swallowing or difficulty breathing - largely unaware of any "neck symptoms"  3)GERD - reports compliance with ongoing medical treatment and no changes in medication dose or frequency. denies adverse side effects related to current therapy.    Clinical Review Panels:  Prevention   Last Mammogram:  No  specific mammographic evidence of malignancy.   (01/28/2007)   Last Pap Smear:  Interpretation/Result:Negative for intraepithelial Lesion or Malignancy.    (01/28/2007)  Immunizations   Last Tetanus Booster:  Historical (10/16/2008)  CBC   WBC:  9.5 (02/21/2010)   RBC:  4.62 (02/21/2010)   Hgb:  14.2 (02/21/2010)   Hct:  40.7 (02/21/2010)   Platelets:  229 (02/21/2010)   MCV  88.1 (02/21/2010)   RDW  12.9 (02/21/2010)   PMN:  56 (02/21/2010)   Monos:  9 (02/21/2010)   Eosinophils:  2 (02/21/2010)   Basophil:  0 (02/21/2010)  Complete Metabolic Panel   Glucose:  105 (02/21/2010)   Sodium:  142 (02/21/2010)   Potassium:  4.0 (02/21/2010)   Chloride:  104 (02/21/2010)   CO2:  26 (02/21/2010)   BUN:  21 (02/21/2010)   Creatinine:  .9 (02/21/2010)   Calcium:  9.9 (02/21/2010)   -  Date:  02/21/2010    WBC: 9.5    HGB: 14.2    HCT: 40.7    RBC: 4.62    PLT: 229    MCV: 88.1    RDW: 12.9    Neutrophil: 56    Lymphs: 34    Monos: 9  Eos: 2    Basophil: 0    BG Random: 105    BUN: 21    Creatinine: .9    Sodium: 142    Potassium: 4.0    Chloride: 104    CO2 Total: 26    Calcium: 9.9  Current Medications (verified): 1)  Pepcid Complete 10-800-165 Mg Chew (Famotidine-Ca Carb-Mag Hydrox) .... Take 1 By Mouth Qd 2)  Warfarin Sodium 5 Mg Tabs (Warfarin Sodium) 3)  Prednisone 10 Mg Tabs (Prednisone) .... Take 40mg  Qd For 3 Days, Then 20 Mg Qd For 3 Days, Then 10mg  Qd For 6 Days, Then Stop. Take Pc. 4)  Singulair 10 Mg Tabs (Montelukast Sodium) .Marland Kitchen.. 1 By Mouth Qd 5)  Symbicort 160-4.5 Mcg/act Aero (Budesonide-Formoterol Fumarate) .... 2 Inh Two Times A Day 6)  Proair Hfa 108 (90 Base) Mcg/act Aers (Albuterol Sulfate) .... 2 Inh Qid As Needed  Allergies (verified): 1)  ! 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    Physical Exam  General:  overweight-appearing.  alert, well-developed, well-nourished, and cooperative to examination.    Lungs:  normal respiratory effort, no intercostal retractions or use of accessory muscles; normal breath sounds bilaterally - no crackles and no wheezes.    Heart:  normal rate, regular rhythm, no murmur, and no rub. BLE without edema.  Psych:  very anxious, occ tearful - Oriented X3, memory intact for recent and remote, normally interactive, good eye contact, not agitated.      Impression & Recommendations:  Problem # 1:  DYSPNEA (ICD-786.05) ?asthma vs acute overlay with panic and anxiety symptoms - suspect acute change precipitated by change in meds - symbicort and pred stop symbicort and resume advair - ok to finish pred as no wheeze  but reports prior hx - cxr and ct chest and labs 02/21/10 - normal - reviewed today VSS including O2 - ekg today for palp - no isch change reassurance provided - as needed xanax for symptoms  ?keep f/u pulm 03/13/10 as per er recs if symptoms unimproved, ?pfts - pt agrees to same  Problem # 2:  PALPITATIONS (ICD-785.1)  suspecy med and anxiety induced - see change in pulm meds above - as needed xanax rx today consider echo if cont symptoms   Orders: EKG w/ Interpretation (93000)  Problem # 3:  ANXIETY STATE, UNSPECIFIED (ICD-300.00) see above - suspect med induced - no hx same Her updated medication list for this problem includes:    Alprazolam 0.5 Mg Tabs (Alprazolam) .Marland Kitchen... 1/2-1 by mouth every 8 hours as needed for anxiety symptoms  Problem # 4:  ASTHMA (ICD-493.90)  stop symbicort and resume adviar - consider pfts if cont symptoms (see dyspnea above) Her updated medication list for this problem includes:    Prednisone 10 Mg Tabs  (Prednisone) .Marland Kitchen... Take 40mg  qd for 3 days, then 20 mg qd for 3 days, then 10mg  qd for 6 days, then stop. take pc.    Singulair 10 Mg Tabs (Montelukast sodium) .Marland Kitchen... 1 by mouth qd    Advair Diskus 100-50 Mcg/dose Aepb (Fluticasone-salmeterol) .Marland Kitchen... 1 inhalation two times a day    Proair Hfa 108 (90 Base) Mcg/act Aers (Albuterol sulfate) .Marland KitchenMarland KitchenMarland KitchenMarland Kitchen  2 inh qid as needed  Pulmonary Functions Reviewed: O2 sat: 97 (02/27/2010)  Complete Medication List: 1)  Pepcid Complete 10-800-165 Mg Chew (Famotidine-ca carb-mag hydrox) .... Take 1 by mouth once daily 2)  Warfarin Sodium 5 Mg Tabs (Warfarin sodium) 3)  Prednisone 10 Mg Tabs (Prednisone) .... Take 40mg  qd for 3 days, then 20 mg qd for 3 days, then 10mg  qd for 6 days, then stop. take pc. 4)  Singulair 10 Mg Tabs (Montelukast sodium) .Marland Kitchen.. 1 by mouth qd 5)  Advair Diskus 100-50 Mcg/dose Aepb (Fluticasone-salmeterol) .Marland Kitchen.. 1 inhalation two times a day 6)  Proair Hfa 108 (90 Base) Mcg/act Aers (Albuterol sulfate) .... 2 inh qid as needed 7)  Alprazolam 0.5 Mg Tabs (Alprazolam) .... 1/2-1 by mouth every 8 hours as needed for anxiety symptoms  Patient Instructions: 1)  it was good to see you today. 2)  medications and history and recent tests reviewed -  3)  will change back to prior asthma medications (stop symbicort and go back to advair), hold singular and finish prednisone 4)  supply of alprazolam given to you to use if/as needed for nerves - 5)  if continued symptoms in next 10-14days, continue with pulmonary evaluation and/or call here for other testing -  6)  Please schedule a follow-up appointment in 6 weeks (or next available) for physical and labs, to review symptoms.meds; call sooner if problems.  Prescriptions: ALPRAZOLAM 0.5 MG TABS (ALPRAZOLAM) 1/2-1 by mouth every 8 hours as needed for anxiety symptoms  #20 x 0   Entered and Authorized by:   Newt Lukes MD   Signed by:   Newt Lukes MD on 02/27/2010   Method used:   Print then  Give to Patient   RxID:   [id number]    Orders Added: 1)  Est. Patient Level IV [34742] 2)  EKG w/ Interpretation [93000]       CXR  Procedure date:  02/21/2010  Findings:      Mild bronchitic changes  CT of Chest  Procedure date:  02/21/2010  Findings:      Impression: 1. No acute pulmonary embolism 2. Heterogeneous/moasic attenuation noted throughout the lungs suggesting small airways disease 3. Nonsoecific left thyroid lesion. a non emergent thyroid of some may be of use

## 2010-03-06 NOTE — Letter (Signed)
Summary: [facility]  [facility]   Imported By: Lennie Odor 02/28/2010 15:32:39  _____________________________________________________________________  External Attachment:    Type:   Image     Comment:   External Document

## 2010-03-11 DIAGNOSIS — I82409 Acute embolism and thrombosis of unspecified deep veins of unspecified lower extremity: Secondary | ICD-10-CM | POA: Insufficient documentation

## 2010-03-13 ENCOUNTER — Encounter: Payer: Self-pay | Admitting: Internal Medicine

## 2010-03-13 ENCOUNTER — Institutional Professional Consult (permissible substitution) (INDEPENDENT_AMBULATORY_CARE_PROVIDER_SITE_OTHER): Payer: Private Health Insurance - Indemnity | Admitting: Internal Medicine

## 2010-03-13 ENCOUNTER — Ambulatory Visit: Payer: Self-pay | Admitting: Internal Medicine

## 2010-03-13 ENCOUNTER — Encounter (INDEPENDENT_AMBULATORY_CARE_PROVIDER_SITE_OTHER): Payer: Private Health Insurance - Indemnity

## 2010-03-13 DIAGNOSIS — J45909 Unspecified asthma, uncomplicated: Secondary | ICD-10-CM

## 2010-03-20 ENCOUNTER — Telehealth: Payer: Self-pay | Admitting: Internal Medicine

## 2010-03-20 NOTE — Miscellaneous (Signed)
Summary: Orders Update pft charges   Clinical Lists Changes  Orders: Added new Service order of Carbon Monoxide diffusing w/capacity (94729) - Signed Added new Service order of Lung Volumes/Gas dilution or washout (94727) - Signed Added new Service order of Spirometry (Pre & Post) (94060) - Signed 

## 2010-03-20 NOTE — Assessment & Plan Note (Signed)
Summary: Pulmonary/ new pt eval > ? pseudoasthma?    Visit Type:  Initial Consult Copy to:  Dr. Jamelle Rushing Primary Provider/Referring Provider:  Newt Lukes MD  CC:  Dyspnea.  History of Present Illness: 67 yowf quit smoking in 03/2008 when  dx with recurrent  PE  from [facility] Leiden mutation with good control of respiratory symptoms on advair historically x "years".   March 13, 2010  1st pulmonary office eval ov cc increase sob at rest and with any activity  since late Jan 2012 abupt onset  assoc some nasal congestion and overt HB symptoms  and cough mostly dry started using more rescue then ended up in ER at [facility] with neg PE by CTangiogram then rx changed to symbiocort and prednisone > couldn't tol symbicort but noted no more need for proaire then restarted advair x one week and better x yawn all the time and doesn't feel can get a deep breath sleeping fine the entire.    Pt denies any significant sore throat, dysphagia, itching, sneezing,  excess or purulent nasal secretions,  fever, chills, sweats, unintended wt loss, pleuritic or exertional cp, hempoptysis, change in activity tolerance  orthopnea pnd or leg swelling Pt also denies any obvious fluctuation in symptoms with weather or environmental change or other alleviating or aggravating factors.         Current Medications (verified): 1)  Pepcid Complete 10-800-165 Mg Chew (Famotidine-Ca Carb-Mag Hydrox) .... Take 1 By Mouth Once Daily 2)  Warfarin Sodium 5 Mg Tabs (Warfarin Sodium) .... Per Clinic 3)  Advair Diskus 100-50 Mcg/dose Aepb (Fluticasone-Salmeterol) .Marland Kitchen.. 1 Inhalation Two Times A Day 4)  Proair Hfa 108 (90 Base) Mcg/act Aers (Albuterol Sulfate) .... 2 Puffs Four Times A Day As Needed 5)  Alprazolam 0.5 Mg Tabs (Alprazolam) .... 1/2-1 By Mouth Every 8 Hours As Needed For Anxiety Symptoms 6)  Tums 500 Mg Chew (Calcium Carbonate Antacid) .... As Directed As Needed  Allergies (verified): 1)  ! 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 Family History of Arthritis (parent) Family History Hypertension (parent) Family History Uterine cancer (grandparent) Heart disease (grandparnet)  mom- parathyroid growth  Social History: Former Smoker, quit 04/13/08. 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  The patient denies non-productive cough, coughing up blood, irregular heartbeats, loss of appetite, weight change, abdominal pain, difficulty swallowing, sore throat, tooth/dental problems, headaches, sneezing, itching, ear ache, anxiety, depression, joint stiffness or pain, rash, change in color of mucus, and fever.    Vital Signs:  Patient profile:   55 year old female Weight:      241 pounds BMI:     35.21 O2 Sat:      97 % on Room air Temp:     98.2 degrees F oral Pulse rate:   82 / minute BP sitting:  116 / 84  (left arm)  Vitals Entered By: Vernie Murders (March 13, 2010 9:47 AM)  O2 Flow:  Room air  Physical Exam  Additional Exam:  wt 235 > 241 March 13, 2010  amb slt anxious wf nad HEENT: nl dentition, turbinates, and orophanx. Nl external ear canals without cough reflex NECK :  without JVD/Nodes/TM/ nl carotid upstrokes bilaterally LUNGS: no acc muscle use, clear to A and P bilaterally without cough on insp or exp maneuvers CV:  RRR  no s3 or murmur or increase in  P2, no edema  ABD:  soft and nontender with nl excursion in the supine position. No bruits or organomegaly, bowel sounds nl MS:  warm without deformities, calf tenderness, cyanosis or clubbing SKIN: warm and dry without lesions   NEURO:  alert, approp, no deficits     Impression & Recommendations:  Problem # 1:  ASTHMA (ICD-493.90) No evidence at all of copd by pft's but if this is asthma it is atypical in that is day >>>> night severity and no better on symbicort    DDX of  difficult airways managment all start with A and  include Adherence, Ace Inhibitors, Acid Reflux, Active Sinus Disease, Alpha 1 Antitripsin deficiency, Anxiety masquerading as Airways dz,  ABPA,  allergy(esp in young), Aspiration (esp in elderly), Adverse effects of DPI,  Active smokers, plus two Bs  = Bronchiectasis and Beta blocker use..and one C= CHF    Acid reflux is the leading suspect here since having active HB refractory to H2 rx.  start ppi and diet  Adverse effect of Advair:  unlikely as not an issue historically but may need to change from advair dpi to hfa  Active sinus dz:  sinus ct next  Anxiety: always a dx of exlcusion   Each maintenance medication was reviewed in detail including most importantly the difference between maintenance and as needed and under what circumstances the prns are to be used. See instructions for specific recommendations   Medications Added to Medication List This Visit: 1)  Warfarin Sodium 5 Mg Tabs (Warfarin sodium) .... Per clinic 2)  Proair Hfa 108 (90 Base) Mcg/act Aers (Albuterol sulfate) .... 2 puffs four times a day as needed 3)  Tums 500 Mg Chew (Calcium carbonate antacid) .... As directed as needed 4)  Pepcid 20 Mg Tabs (Famotidine) .... Take one by mouth at bedtime 5)  Nexium 40 Mg Cpdr (Esomeprazole magnesium) .... By mouth daily. take one half hour before eating.  Other Orders: Consultation Level V [phone])  Patient Instructions: 1)  Nexium 40 mg Take  one  30-60 min before first meal of the day  2)  Pepcid 20 mg one at bedtime 3)  stop tums 4)  GERD (REFLUX)  is a common cause of respiratory symptoms. It commonly presents without heartburn and can be treated with medication, but also with lifestyle changes including avoidance of late meals, excessive alcohol, smoking cessation, and avoid fatty foods, chocolate, peppermint, colas, red wine, and acidic juices such as orange juice. NO MINT OR MENTHOL PRODUCTS SO NO COUGH DROPS  5)  USE SUGARLESS CANDY INSTEAD (jolley ranchers)  6)  NO OIL BASED VITAMINS  7)  Please schedule a follow-up appointment in 4 weeks, sooner if needed  Prescriptions: NEXIUM 40 MG  CPDR (ESOMEPRAZOLE MAGNESIUM) By mouth daily. Take one half hour before eating.  #34 x 3   Entered and Authorized by:   Nyoka Cowden MD   Signed by:  Nyoka Cowden MD on 03/13/2010   Method used:   Electronically to        [facility]  [address] [address] [phone][facility] (retail)       [address]       [place], [place]  [address]       Ph: [phone] or [phone]       Fax: [phone]   RxID:   [id number]

## 2010-03-21 ENCOUNTER — Encounter (INDEPENDENT_AMBULATORY_CARE_PROVIDER_SITE_OTHER): Payer: Private Health Insurance - Indemnity

## 2010-03-21 ENCOUNTER — Encounter: Payer: Self-pay | Admitting: Internal Medicine

## 2010-03-21 DIAGNOSIS — I80299 Phlebitis and thrombophlebitis of other deep vessels of unspecified lower extremity: Secondary | ICD-10-CM

## 2010-03-21 DIAGNOSIS — Z7901 Long term (current) use of anticoagulants: Secondary | ICD-10-CM

## 2010-03-21 LAB — CONVERTED CEMR LAB: POC INR: 3.5

## 2010-03-22 ENCOUNTER — Other Ambulatory Visit: Payer: Self-pay | Admitting: Internal Medicine

## 2010-03-22 DIAGNOSIS — E041 Nontoxic single thyroid nodule: Secondary | ICD-10-CM

## 2010-03-26 ENCOUNTER — Ambulatory Visit
Admission: RE | Admit: 2010-03-26 | Discharge: 2010-03-26 | Disposition: A | Discharge status: Home / Self Care | Payer: Private Health Insurance - Indemnity | Source: Ambulatory Visit | Attending: Internal Medicine | Admitting: Internal Medicine

## 2010-03-26 DIAGNOSIS — E041 Nontoxic single thyroid nodule: Secondary | ICD-10-CM

## 2010-03-26 NOTE — Progress Notes (Signed)
Summary: sched annual thyroid US f/u  Phone Note Outgoing Call   Summary of Call: please call pt - let her know i have placed order for f/u annual thyroid US to follow up on known thyroid nodules - [facility] will call once arranged - thanks Initial call taken by: Newt Lukes MD,  March 20, 2010 11:03 AM  Follow-up for Phone Call        Called pt no ansew LMOM md repsonse Follow-up by: Orlan Leavens RMA,  March 20, 2010 4:55 PM

## 2010-03-26 NOTE — Medication Information (Signed)
Summary: rov/sp  Anticoagulant Therapy  Managed by: Weston Brass, PharmD Referring MD: Rene Paci, MD PCP: Newt Lukes MD Supervising MD: Ladona Ridgel MD, Sharlot Gowda Indication 1: DVT Lab Used: [facility] Site: [address] INR POC 3.5 INR RANGE 2.0 - 3.0  Dietary changes: no    Health status changes: no    Bleeding/hemorrhagic complications: no    Recent/future hospitalizations: no    Any changes in medication regimen? yes       Details: Was on prednisone x 12 after ER visit at end of January, on Nexium   Recent/future dental: no  Any missed doses?: no       Is patient compliant with meds? yes       Allergies: 1)  ! Codeine  Anticoagulation Management History:      The patient is taking warfarin and comes in today for a routine follow up visit.  Negative risk factors for bleeding include an age less than 65 years old.  The bleeding index is 'low risk'.  Negative CHADS2 values include Age > 35 years old.  Anticoagulation responsible provider: Ladona Ridgel MD, Sharlot Gowda.  INR POC: 3.5.  Cuvette Lot#: [id number].  Exp: 01/2011.    Anticoagulation Management Assessment/Plan:      The patient's current anticoagulation dose is Warfarin sodium 5 mg tabs: per clinic.  The target INR is 2.0-3.0.  The next INR is due 04/11/2010.  Anticoagulation instructions were given to patient.  Results were reviewed/authorized by Weston Brass, PharmD.  She was notified by Weston Brass PharmD.         Prior Anticoagulation Instructions: INR 2.5  Coumadin 5 mg tablets - Continue 2 tablets every day except 1 tablet on Wednesdays   Current Anticoagulation Instructions: INR 3.5  Skip today's dose of Coumadin then resume same dose of 10mg  daily except 5mg  on Wednesday.  Recheck INR in 3 weeks.

## 2010-04-10 ENCOUNTER — Ambulatory Visit (INDEPENDENT_AMBULATORY_CARE_PROVIDER_SITE_OTHER): Payer: Private Health Insurance - Indemnity | Admitting: Internal Medicine

## 2010-04-10 ENCOUNTER — Encounter: Payer: Self-pay | Admitting: Internal Medicine

## 2010-04-10 ENCOUNTER — Ambulatory Visit: Payer: Private Health Insurance - Indemnity | Admitting: Internal Medicine

## 2010-04-10 ENCOUNTER — Encounter (INDEPENDENT_AMBULATORY_CARE_PROVIDER_SITE_OTHER): Payer: Private Health Insurance - Indemnity

## 2010-04-10 ENCOUNTER — Encounter: Payer: Self-pay | Admitting: Cardiology

## 2010-04-10 DIAGNOSIS — K219 Gastro-esophageal reflux disease without esophagitis: Secondary | ICD-10-CM

## 2010-04-10 DIAGNOSIS — M7989 Other specified soft tissue disorders: Secondary | ICD-10-CM

## 2010-04-10 DIAGNOSIS — Z7901 Long term (current) use of anticoagulants: Secondary | ICD-10-CM

## 2010-04-10 DIAGNOSIS — R0602 Shortness of breath: Secondary | ICD-10-CM

## 2010-04-10 DIAGNOSIS — I80299 Phlebitis and thrombophlebitis of other deep vessels of unspecified lower extremity: Secondary | ICD-10-CM

## 2010-04-10 LAB — CONVERTED CEMR LAB: POC INR: 3

## 2010-04-16 NOTE — Assessment & Plan Note (Signed)
Summary: 6 WK FU  STC   Vital Signs:  Patient profile:   55 year old female Weight:      246.8 pounds (112.18 kg) O2 Sat:      96 % on Room air Temp:     98.4 degrees F (36.89 degrees C) oral Pulse rate:   85 / minute BP sitting:   122 / 80  (left arm) Cuff size:   large  Vitals Entered By: Orlan Leavens RMA (April 10, 2010 9:30 AM)  O2 Flow:  Room air CC: 6 week follow-up Is Patient Diabetic? No Pain Assessment Patient in pain? no        Primary Care Provider:  Newt Lukes MD  CC:  6 week follow-up.  History of Present Illness: f/u SOB seen by pulm with pfts 03/13/10 - med changes reviewed less wheeze and doe - resolved anxious, palpitations and nervousness - unable to sleep (acute change) stopping symbicort no fever, cough, weight change, night sweats  hx recurrent DVT x3 with FVL defic - last dx 1/11 has seen heme in f/u> rec life long coumadin no CP or SOB persisting mild swelling of RLE  thyroid nodules - dx [place] 8/10 -02/2009 and 02/2100 [place] w/o change -  no trouble swallowing or difficulty breathing - largely unaware of any "neck symptoms"  GERD - reports compliance with ongoing medical treatment and no changes in medication dose or frequency. denies adverse side effects related to current therapy. ?cheaper med tx than nexium   Current Medications (verified): 1)  Warfarin Sodium 5 Mg Tabs (Warfarin Sodium) .... Per Clinic 2)  Advair Diskus 100-50 Mcg/dose Aepb (Fluticasone-Salmeterol) .Marland Kitchen.. 1 Inhalation Two Times A Day 3)  Proair Hfa 108 (90 Base) Mcg/act Aers (Albuterol Sulfate) .... 2 Puffs Four Times A Day As Needed 4)  Alprazolam 0.5 Mg Tabs (Alprazolam) .... 1/2-1 By Mouth Every 8 Hours As Needed For Anxiety Symptoms 5)  Pepcid 20 Mg Tabs (Famotidine) .... Take One By Mouth At Bedtime 6)  Nexium 40 Mg  Cpdr (Esomeprazole Magnesium) .... By Mouth Daily. Take One Half Hour Before Eating.  Allergies (verified): 1)  ! 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    Physical Exam  General:  overweight-appearing.  alert, well-developed, well-nourished, and cooperative to examination.    Lungs:  normal respiratory effort, no intercostal retractions or use of accessory muscles; normal breath sounds bilaterally - no crackles and no wheezes.    Heart:  normal rate, regular rhythm, no murmur, and no rub. BLE without edema.  Skin:  erythema chronic skin irritation distal rle from edema Psych:  very anxious, occ tearful - Oriented X3, memory intact for recent and remote, normally interactive, good eye contact, not agitated.      Impression & Recommendations:  Problem # 1:  DYSPNEA (ICD-786.05) Assessment Improved s/p eval by pulm and pfts 03/13/10 - doing well with change from symbicort back to advair component of asthma vs acute overlay with panic and anxiety symptoms - suspect acute change precipitated by change in meds - symbicort and pred cxr and ct chest and labs 02/21/10 - normal - reviewed  today VSS including O2 - reassurance provided - as needed xanax for symptoms   Problem # 2:  GERD (ICD-530.81) change nexium to generic omeprazole - erx done Her updated medication list for this problem includes:    Pepcid 20 Mg Tabs (Famotidine) .Marland Kitchen... Take one by mouth at bedtime    Omeprazole 40 Mg Cpdr (Omeprazole) .Marland Kitchen... 1 by mouth once daily  Problem # 3:  SWELLING OF LIMB (ICD-729.81) written rx for compression hose and elcon cream  Complete Medication List: 1)  Warfarin Sodium 5 Mg Tabs (Warfarin sodium) .... Per clinic 2)  Advair Diskus 100-50 Mcg/dose Aepb (Fluticasone-salmeterol) .Marland Kitchen.. 1 inhalation two times  a day 3)  Proair Hfa 108 (90 Base) Mcg/act Aers (Albuterol sulfate) .... 2 puffs four times a day as needed 4)  Alprazolam 0.5 Mg Tabs (Alprazolam) .... 1/2-1 by mouth every 8 hours as needed for anxiety symptoms 5)  Pepcid 20 Mg Tabs (Famotidine) .... Take one by mouth at bedtime 6)  Omeprazole 40 Mg Cpdr (Omeprazole) .Marland Kitchen.. 1 by mouth once daily 7)  Elocon 0.1 % Crea (Mometasone furoate) .... Apply to affected skin as needed 8)  Compression Hose, Knee High  .... Apply each am, may remove at bedtime  - icd9:729.81  Patient Instructions: 1)  it was good to see you today. 2)  change nexium to omeprazole as discussed and use elocon if needed - local prescriptions done - let us know if 90d mail order needed 3)  refills on advair done mail order 4)  use compression hose for swelling 5)  Please schedule a follow-up appointment in summer 2012 for medical physiccal and labs, call sooner if problems.  Prescriptions: COMPRESSION HOSE, KNEE HIGH apply each AM, may remove at bedtime  - ICD9:729.81  #1 pair x 0   Entered and Authorized by:   Newt Lukes MD   Signed by:   Newt Lukes MD on 04/10/2010   Method used:   Print then Give to Patient   RxID:   [id number] ELOCON 0.1 % CREA (MOMETASONE FUROATE) apply to affected skin as needed  #1 x 1   Entered and Authorized by:   Newt Lukes MD   Signed by:   Newt Lukes MD on 04/10/2010   Method used:   Print then Give to Patient   RxID:   [id number] ADVAIR DISKUS 100-50 MCG/DOSE AEPB (FLUTICASONE-SALMETEROL) 1 inhalation two times a day  #3 x 3   Entered and Authorized by:   Newt Lukes MD   Signed by:   Newt Lukes MD on 04/10/2010   Method used:   Electronically to        [facility]* (mail-order)             , [place]         Ph: [phone]       Fax: [phone]   RxID:   [id number] OMEPRAZOLE 40 MG CPDR (OMEPRAZOLE) 1 by mouth once daily  #30 x 1   Entered and Authorized by:   Newt Lukes  MD   Signed by:   Newt Lukes MD on 04/10/2010   Method used:   Printed then faxed to ...       [facility] (retail)       [address]       [place], [place]  [address]       Ph: [phone]       Fax: [phone]   RxID:  [id number]    Orders Added: 1)  Est. Patient Level IV [14782]  Appended Document: 6 WK FU  STC    Clinical Lists Changes  Medications: Rx of OMEPRAZOLE 40 MG CPDR (OMEPRAZOLE) 1 by mouth once daily;  #30 x 1;  Signed;  Entered by: Orlan Leavens RMA;  Authorized by: Newt Lukes MD;  Method used: Faxed to [facility], [address], [place], [place]  [address], Ph: [phone], Fax: [phone] Rx of ELOCON 0.1 % CREA (MOMETASONE FUROATE) apply to affected skin as needed;  #1 x 1;  Signed;  Entered by: Orlan Leavens RMA;  Authorized by: Newt Lukes MD;  Method used: Faxed to [facility], [address], [place], [place]  [address], Ph: [phone], Fax: [phone]    Prescriptions: ELOCON 0.1 % CREA (MOMETASONE FUROATE) apply to affected skin as needed  #1 x 1   Entered by:   Orlan Leavens RMA   Authorized by:   Newt Lukes MD   Signed by:   Orlan Leavens RMA on 04/10/2010   Method used:   Faxed to ...       [facility] (retail)       [address]       [place], [place]  [address]       Ph: [phone]       Fax: [phone]   RxID:   [id number] OMEPRAZOLE 40 MG CPDR (OMEPRAZOLE) 1 by mouth once daily  #30 x 1   Entered by:   Orlan Leavens RMA   Authorized by:   Newt Lukes MD   Signed by:   Orlan Leavens RMA on 04/10/2010   Method used:   Faxed to ...       [facility] (retail)       [address]       [place], [place]  [address]       Ph: [phone]       Fax: [phone]   RxID:   [id number]

## 2010-04-16 NOTE — Medication Information (Signed)
Summary: rov/sp  Anticoagulant Therapy  Managed by: Windell Hummingbird, RN Referring MD: Rene Paci, MD PCP: Newt Lukes MD Supervising MD: Riley Kill MD, Maisie Fus Indication 1: DVT Lab Used: [facility] Site: [address] INR POC 3.0 INR RANGE 2.0 - 3.0  Dietary changes: no    Health status changes: no    Bleeding/hemorrhagic complications: no    Recent/future hospitalizations: no    Any changes in medication regimen? no    Recent/future dental: no  Any missed doses?: no       Is patient compliant with meds? yes       Allergies: 1)  ! Codeine  Anticoagulation Management History:      The patient is taking warfarin and comes in today for a routine follow up visit.  Negative risk factors for bleeding include an age less than 61 years old.  The bleeding index is 'low risk'.  Negative CHADS2 values include Age > 36 years old.  Anticoagulation responsible provider: Riley Kill MD, Maisie Fus.  INR POC: 3.0.  Cuvette Lot#: [id number].  Exp: 02/2011.    Anticoagulation Management Assessment/Plan:      The patient's current anticoagulation dose is Warfarin sodium 5 mg tabs: per clinic.  The target INR is 2.0-3.0.  The next INR is due 05/08/2010.  Anticoagulation instructions were given to patient.  Results were reviewed/authorized by Windell Hummingbird, RN.  She was notified by Windell Hummingbird, RN.         Prior Anticoagulation Instructions: INR 3.5  Skip today's dose of Coumadin then resume same dose of 10mg  daily except 5mg  on Wednesday.  Recheck INR in 3 weeks.   Current Anticoagulation Instructions: INR 3.0 Eat a serving of dark green veggies.  Then, continue taking 10 mg every day, except take 5 mg on Wednesdays. Recheck in 4 weeks.

## 2010-04-26 ENCOUNTER — Telehealth: Payer: Self-pay

## 2010-04-26 DIAGNOSIS — K219 Gastro-esophageal reflux disease without esophagitis: Secondary | ICD-10-CM

## 2010-04-26 NOTE — Telephone Encounter (Signed)
Pt called stating that she would prefer to go back to Nexium 40mg  1 cap 1/2 before eating, Okay to Rx to [facility] as requested. Previously on Omeprazole 40mg  qd

## 2010-04-29 MED ORDER — ESOMEPRAZOLE MAGNESIUM 40 MG PO CPDR: 40.0000 mg | DELAYED_RELEASE_CAPSULE | Freq: Every day | ORAL | Status: DC

## 2010-04-29 NOTE — Telephone Encounter (Signed)
Ok to change PPI as requested - unable to take 1/2 cap so rx for 20mg  approved

## 2010-04-29 NOTE — Telephone Encounter (Signed)
Sorry- I meant to type 1/2 hour before meals. Rx sent per pt req

## 2010-05-08 ENCOUNTER — Ambulatory Visit (INDEPENDENT_AMBULATORY_CARE_PROVIDER_SITE_OTHER): Payer: Private Health Insurance - Indemnity | Admitting: *Deleted

## 2010-05-08 DIAGNOSIS — I82409 Acute embolism and thrombosis of unspecified deep veins of unspecified lower extremity: Secondary | ICD-10-CM

## 2010-05-08 DIAGNOSIS — Z7901 Long term (current) use of anticoagulants: Secondary | ICD-10-CM

## 2010-05-08 NOTE — Patient Instructions (Addendum)
INR 2.7  Coumadin 5mg  tabs  Take 2 tabs each day EXCEPT 1 tab on WED Return in 4 weeks WED May 9 at 12 noon

## 2010-06-05 ENCOUNTER — Ambulatory Visit (INDEPENDENT_AMBULATORY_CARE_PROVIDER_SITE_OTHER): Payer: Private Health Insurance - Indemnity | Admitting: *Deleted

## 2010-06-05 DIAGNOSIS — I82409 Acute embolism and thrombosis of unspecified deep veins of unspecified lower extremity: Secondary | ICD-10-CM

## 2010-06-05 LAB — POCT INR: INR: 4.5

## 2010-06-19 ENCOUNTER — Ambulatory Visit (INDEPENDENT_AMBULATORY_CARE_PROVIDER_SITE_OTHER): Payer: Private Health Insurance - Indemnity | Admitting: *Deleted

## 2010-06-19 DIAGNOSIS — I82409 Acute embolism and thrombosis of unspecified deep veins of unspecified lower extremity: Secondary | ICD-10-CM

## 2010-06-19 LAB — POCT INR: INR: 2.8

## 2010-07-17 ENCOUNTER — Ambulatory Visit (INDEPENDENT_AMBULATORY_CARE_PROVIDER_SITE_OTHER): Payer: Private Health Insurance - Indemnity | Admitting: *Deleted

## 2010-07-17 DIAGNOSIS — I82409 Acute embolism and thrombosis of unspecified deep veins of unspecified lower extremity: Secondary | ICD-10-CM

## 2010-07-23 ENCOUNTER — Encounter: Payer: Self-pay | Admitting: Internal Medicine

## 2010-08-13 ENCOUNTER — Other Ambulatory Visit: Payer: Self-pay | Admitting: Internal Medicine

## 2010-08-13 ENCOUNTER — Other Ambulatory Visit (INDEPENDENT_AMBULATORY_CARE_PROVIDER_SITE_OTHER): Payer: Private Health Insurance - Indemnity

## 2010-08-13 DIAGNOSIS — Z0000 Encounter for general adult medical examination without abnormal findings: Secondary | ICD-10-CM

## 2010-08-13 LAB — TSH: TSH: 1.15 u[IU]/mL (ref 0.35–5.50)

## 2010-08-13 LAB — BASIC METABOLIC PANEL
BUN: 14 mg/dL (ref 6–23)
CO2: 26 mEq/L (ref 19–32)
Calcium: 9.2 mg/dL (ref 8.4–10.5)
GFR: 62.13 mL/min (ref >60.00)
Glucose, Bld: 106 mg/dL — ABNORMAL HIGH (ref 70–99)
Sodium: 140 mEq/L (ref 135–145)

## 2010-08-13 LAB — LIPID PANEL
HDL: 51.1 mg/dL (ref >39.00)
VLDL: 20.2 mg/dL (ref 0.0–40.0)

## 2010-08-13 LAB — URINALYSIS
Nitrite: NEGATIVE
Specific Gravity, Urine: >=1.03 (ref 1.000–1.030)
Total Protein, Urine: NEGATIVE
Urine Glucose: NEGATIVE
Urobilinogen, UA: 0.2 (ref 0.0–1.0)

## 2010-08-13 LAB — CBC WITH DIFFERENTIAL/PLATELET
Basophils Relative: 0.4 % (ref 0.0–3.0)
Eosinophils Relative: 3.3 % (ref 0.0–5.0)
HCT: 41.8 % (ref 36.0–46.0)
Hemoglobin: 13.9 g/dL (ref 12.0–15.0)
Lymphs Abs: 2 10*3/uL (ref 0.7–4.0)
MCV: 91.1 fl (ref 78.0–100.0)
Monocytes Absolute: 0.5 10*3/uL (ref 0.1–1.0)
Monocytes Relative: 9.5 % (ref 3.0–12.0)
Neutro Abs: 2.8 10*3/uL (ref 1.4–7.7)
WBC: 5.5 10*3/uL (ref 4.5–10.5)

## 2010-08-13 LAB — HEPATIC FUNCTION PANEL
Bilirubin, Direct: 0.1 mg/dL (ref 0.0–0.3)
Total Bilirubin: 0.8 mg/dL (ref 0.3–1.2)

## 2010-08-21 ENCOUNTER — Ambulatory Visit (INDEPENDENT_AMBULATORY_CARE_PROVIDER_SITE_OTHER): Payer: Private Health Insurance - Indemnity | Admitting: Internal Medicine

## 2010-08-21 ENCOUNTER — Ambulatory Visit (INDEPENDENT_AMBULATORY_CARE_PROVIDER_SITE_OTHER): Payer: Private Health Insurance - Indemnity | Admitting: *Deleted

## 2010-08-21 ENCOUNTER — Encounter: Payer: Self-pay | Admitting: Internal Medicine

## 2010-08-21 ENCOUNTER — Other Ambulatory Visit: Payer: Self-pay | Admitting: *Deleted

## 2010-08-21 ENCOUNTER — Encounter (HOSPITAL_BASED_OUTPATIENT_CLINIC_OR_DEPARTMENT_OTHER): Payer: Private Health Insurance - Indemnity | Admitting: Oncology

## 2010-08-21 VITALS — BP 110/80 | HR 71 | Temp 98.2°F | Ht 69.5 in | Wt 228.4 lb

## 2010-08-21 DIAGNOSIS — D6859 Other primary thrombophilia: Secondary | ICD-10-CM

## 2010-08-21 DIAGNOSIS — E785 Hyperlipidemia, unspecified: Secondary | ICD-10-CM

## 2010-08-21 DIAGNOSIS — E041 Nontoxic single thyroid nodule: Secondary | ICD-10-CM

## 2010-08-21 DIAGNOSIS — K219 Gastro-esophageal reflux disease without esophagitis: Secondary | ICD-10-CM

## 2010-08-21 DIAGNOSIS — Z0000 Encounter for general adult medical examination without abnormal findings: Secondary | ICD-10-CM

## 2010-08-21 DIAGNOSIS — Z86718 Personal history of other venous thrombosis and embolism: Secondary | ICD-10-CM

## 2010-08-21 DIAGNOSIS — I82409 Acute embolism and thrombosis of unspecified deep veins of unspecified lower extremity: Secondary | ICD-10-CM

## 2010-08-21 MED ORDER — WARFARIN SODIUM 5 MG PO TABS: ORAL_TABLET | ORAL | Status: DC

## 2010-08-21 NOTE — Patient Instructions (Signed)
It was good to see you today. Labs reviewed - will watch cholesterol and expect this to improve with continued weight loss, diet and exercise as ongoing - good luck with Weight Watchers! Call dr. Nettie Elm to schedule your PAP and mammogram - sooner than later Will hold on referral for colonoscopy at this time - let us know if you change your mind on this screening -  Medications reviewed, no changes at this time. Please schedule followup in 6 months for cholesterol check, call sooner if problems.

## 2010-08-21 NOTE — Progress Notes (Signed)
Subjective:    Patient ID: Megan Parrish, female    DOB: April 07, 1955, 55 y.o.   MRN: [id number]  HPI patient is here today for annual physical. Patient feels well and has no complaints.  Also reviewed chronic medical issues:  hx recurrent DVT x3 with FVL defic - last dx 1/11  Follows q6-12 mo with heme (shadad) - on life long coumadin  no CP or SOB -persisting mild swelling of RLE   thyroid nodules -  dx [place] 8/10 -2/2011and 02/2010 [place] without  change -  no trouble swallowing or difficulty breathing -  largely unaware of any "neck symptoms"   GERD - reports compliance with ongoing medical treatment and no changes in medication dose or frequency. denies adverse side effects related to current therapy. Prev on nexium  Family History  Problem Relation Age of Onset  . Arthritis Other     Parent  . Uterine cancer Other     grandparent  . Dementia Father   . Hypertension Father    History  Substance Use Topics  . Smoking status: Former Smoker -- 1.0 packs/day for 30 years    Types: Cigarettes    Quit date: 04/13/2008  . Smokeless tobacco: Not on file   Comment: single, lives alone 1 dog and 3 cats. 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 Alcohol Use: No    Review of Systems Constitutional: Negative for fever.  Respiratory: Negative for cough and shortness of breath.   Cardiovascular: Negative for chest pain.  Gastrointestinal: Negative for abdominal pain.  Musculoskeletal: Negative for gait problem.  Skin: Negative for rash.  Neurological: Negative for dizziness.  No other specific complaints in a complete review of systems (except as listed in HPI above).     Objective:   Physical Exam BP 110/80  Pulse 71  Temp(Src) 98.2 F (36.8 C) (Oral)  Ht 5' 9.5" (1.765 m)  Wt 228 lb 6.4 oz (103.602 kg)  BMI 33.25 kg/m2  SpO2 95% Wt Readings from Last 3 Encounters:  08/21/10 228 lb 6.4 oz (103.602 kg)  04/10/10 246 lb 12.8 oz (111.948 kg)  03/13/10 241 lb  (109.317 kg)    Constitutional: She is overweight; oriented to person, place, and time. She appears well-developed and well-nourished. No distress.  HENT: Head: Normocephalic and atraumatic. Ears; B TMs ok, no erythema or effusion; Nose: Nose normal.  Mouth/Throat: Oropharynx is clear and moist. No oropharyngeal exudate.  Eyes: Conjunctivae and EOM are normal. Pupils are equal, round, and reactive to light. No scleral icterus.  Neck: Normal range of motion. Neck supple. No JVD present. No thyromegaly present.  Cardiovascular: Normal rate, regular rhythm and normal heart sounds.  No murmur heard. No BLE edema. Pulmonary/Chest: Effort normal and breath sounds normal. No respiratory distress. She has no wheezes.  Abdominal: Soft. Bowel sounds are normal. She exhibits no distension. There is no tenderness.  Musculoskeletal: Normal range of motion, no joint effusions. No gross deformities Neurological: She is alert and oriented to person, place, and time. No cranial nerve deficit. Coordination normal.  Skin: Skin is warm and dry. No rash noted. No erythema.  Psychiatric: She has a normal mood and affect. Her behavior is normal. Judgment and thought content normal.   Lab Results  Component Value Date   WBC 5.5 08/13/2010   HGB 13.9 08/13/2010   HCT 41.8 08/13/2010   PLT 169.0 08/13/2010   CHOL 221* 08/13/2010   TRIG 101.0 08/13/2010   HDL 51.10 08/13/2010  LDLDIRECT 174.5 08/13/2010   ALT 23 08/13/2010   AST 22 08/13/2010   NA 140 08/13/2010   K 4.2 08/13/2010   CL 108 08/13/2010   CREATININE 1.0 08/13/2010   BUN 14 08/13/2010   CO2 26 08/13/2010   TSH 1.15 08/13/2010   INR 2.4 08/21/2010   EKG - NSR 72 bpm - no acute changes      Assessment & Plan:  CPX- v70.0 - Patient has been counseled on age-appropriate routine health concerns for screening and prevention. These are reviewed and up-to-date. (planning follow up soon with ob-g for PAP/pelvic and sched mammo). Immunizations are up-to-date or  declined. Labs and ECG reviewed.  Hyperlipidemia - ongoing efforts at weight loss with diet and exercise

## 2010-08-22 ENCOUNTER — Encounter: Payer: Self-pay | Admitting: Internal Medicine

## 2010-08-22 DIAGNOSIS — E785 Hyperlipidemia, unspecified: Secondary | ICD-10-CM | POA: Insufficient documentation

## 2010-08-22 NOTE — Assessment & Plan Note (Signed)
Dx CPX 07/2010 - continue diet and exercise with ongoing weight reduction for life style control of same Recheck 6 mo - consider statin tx if still elevated LDL

## 2010-08-22 NOTE — Assessment & Plan Note (Signed)
Using OTC meds for same due to $$$ of nexium - symptoms controlled at this time

## 2010-08-22 NOTE — Assessment & Plan Note (Signed)
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 

## 2010-09-18 ENCOUNTER — Encounter: Payer: Private Health Insurance - Indemnity | Admitting: *Deleted

## 2010-09-23 ENCOUNTER — Ambulatory Visit (INDEPENDENT_AMBULATORY_CARE_PROVIDER_SITE_OTHER): Payer: Private Health Insurance - Indemnity | Admitting: *Deleted

## 2010-09-23 DIAGNOSIS — I82409 Acute embolism and thrombosis of unspecified deep veins of unspecified lower extremity: Secondary | ICD-10-CM

## 2010-10-21 ENCOUNTER — Ambulatory Visit (INDEPENDENT_AMBULATORY_CARE_PROVIDER_SITE_OTHER): Payer: Private Health Insurance - Indemnity | Admitting: *Deleted

## 2010-10-21 DIAGNOSIS — I82409 Acute embolism and thrombosis of unspecified deep veins of unspecified lower extremity: Secondary | ICD-10-CM

## 2010-11-18 ENCOUNTER — Ambulatory Visit (INDEPENDENT_AMBULATORY_CARE_PROVIDER_SITE_OTHER): Payer: Private Health Insurance - Indemnity | Admitting: *Deleted

## 2010-11-18 DIAGNOSIS — I82409 Acute embolism and thrombosis of unspecified deep veins of unspecified lower extremity: Secondary | ICD-10-CM

## 2010-11-18 LAB — POCT INR: INR: 3.2

## 2010-12-16 ENCOUNTER — Ambulatory Visit (INDEPENDENT_AMBULATORY_CARE_PROVIDER_SITE_OTHER): Payer: Private Health Insurance - Indemnity | Admitting: *Deleted

## 2010-12-16 DIAGNOSIS — I82409 Acute embolism and thrombosis of unspecified deep veins of unspecified lower extremity: Secondary | ICD-10-CM

## 2010-12-16 LAB — POCT INR: INR: 2.4

## 2011-01-13 ENCOUNTER — Ambulatory Visit (INDEPENDENT_AMBULATORY_CARE_PROVIDER_SITE_OTHER): Payer: Private Health Insurance - Indemnity | Admitting: *Deleted

## 2011-01-13 DIAGNOSIS — I82409 Acute embolism and thrombosis of unspecified deep veins of unspecified lower extremity: Secondary | ICD-10-CM

## 2011-01-25 ENCOUNTER — Telehealth: Payer: Self-pay | Admitting: Oncology

## 2011-01-25 NOTE — Telephone Encounter (Signed)
Talked to pt , gave her appt for 03/07/11 MD visit

## 2011-02-10 ENCOUNTER — Ambulatory Visit (INDEPENDENT_AMBULATORY_CARE_PROVIDER_SITE_OTHER): Payer: Private Health Insurance - Indemnity | Admitting: *Deleted

## 2011-02-10 DIAGNOSIS — I82409 Acute embolism and thrombosis of unspecified deep veins of unspecified lower extremity: Secondary | ICD-10-CM

## 2011-02-10 LAB — POCT INR: INR: 3.5

## 2011-03-07 ENCOUNTER — Telehealth: Payer: Self-pay | Admitting: Oncology

## 2011-03-07 ENCOUNTER — Ambulatory Visit (HOSPITAL_BASED_OUTPATIENT_CLINIC_OR_DEPARTMENT_OTHER): Payer: Private Health Insurance - Indemnity | Admitting: Oncology

## 2011-03-07 VITALS — BP 118/76 | HR 20 | Temp 97.0°F | Ht 69.5 in | Wt 218.6 lb

## 2011-03-07 DIAGNOSIS — D6859 Other primary thrombophilia: Secondary | ICD-10-CM

## 2011-03-07 DIAGNOSIS — Z7901 Long term (current) use of anticoagulants: Secondary | ICD-10-CM

## 2011-03-07 DIAGNOSIS — I8290 Acute embolism and thrombosis of unspecified vein: Secondary | ICD-10-CM

## 2011-03-07 DIAGNOSIS — Z86718 Personal history of other venous thrombosis and embolism: Secondary | ICD-10-CM

## 2011-03-07 NOTE — Telephone Encounter (Signed)
Gv pt appt for oct2013 °

## 2011-03-07 NOTE — Progress Notes (Signed)
Hematology and Oncology Follow Up Visit  Megan Parrish [id number] Dec 19, 1955 56 y.o. 03/07/2011 4:35 PM  CC: Vikki Ports A. Felicity Coyer, MD    Principle Diagnosis: This is a 56 year old female with history of deep vein thrombosis.  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  The last one was diagnosed in January 2011.   Current therapy: She is anticoagulated with therapeutic doses of Coumadin since 01/2009.  Interim History:  Megan Parrish presents today for a followup visit.  As mentioned she does have of unprovoked blood clots last of which was again in January 2011.  She has been on Coumadin since that time and we have decided that she really needs to be on long-term anticoagulation given the fact that she had really a thrombosis off anticoagulation.  She has been doing fairly well with Coumadin since the last time I saw her.  She had not reported any complications.  She did not report any bleeding.  She has not had any toxicities or hospitalization or any illnesses.  She has continued to perform activities of daily living without any hindrance or decline.  She had not had any subtherapeutic levels.    Medications: I have reviewed the patient's current medications. Current outpatient prescriptions:lansoprazole (PREVACID) 15 MG capsule, Take 15 mg by mouth daily., Disp: , Rfl: ;  mometasone (ELOCON) 0.1 % cream, Apply topically daily.  , Disp: , Rfl: ;  Multiple Vitamin (MULTIVITAMIN) tablet, Take 1 tablet by mouth daily.  , Disp: , Rfl: ;  albuterol (PROAIR HFA) 108 (90 BASE) MCG/ACT inhaler, Inhale 2 puffs into the lungs 4 (four) times daily as needed.  , Disp: , Rfl:  ALPRAZolam (XANAX) 0.5 MG tablet, Take 0.5 mg by mouth every 8 (eight) hours as needed.  , Disp: , Rfl: ;  famotidine (PEPCID) 20 MG tablet, Take 20 mg by mouth 2 (two) times daily.  , Disp: , Rfl: ;  Fluticasone-Salmeterol (ADVAIR DISKUS) 100-50 MCG/DOSE AEPB, Inhale 1 puff into the lungs 2 (two)  times daily.  , Disp: , Rfl: ;  warfarin (COUMADIN) 5 MG tablet, Take as directed by Anticoagulation clinic , Disp: 180 tablet, Rfl: 3  Allergies:  Allergies  Allergen Reactions  . Codeine     Past Medical History, Surgical history, Social history, and Family History were reviewed and updated.  Review of Systems: Constitutional:  Negative for fever, chills, night sweats, anorexia, weight loss, pain. Cardiovascular: no chest pain or dyspnea on exertion Respiratory: no cough, shortness of breath, or wheezing Neurological: no TIA or stroke symptoms Dermatological: negative ENT: negative Skin: Negative. Gastrointestinal: no abdominal pain, change in bowel habits, or black or bloody stools Genito-Urinary: no dysuria, trouble voiding, or hematuria Hematological and Lymphatic: negative Breast: negative Musculoskeletal: negative Remaining ROS negative. Physical Exam: Blood pressure 118/76, pulse 20, temperature 97 F (36.1 C), temperature source Oral, height 5' 9.5" (1.765 m), weight 218 lb 9.6 oz (99.156 kg). ECOG:  General appearance: alert Head: Normocephalic, without obvious abnormality, atraumatic Neck: no adenopathy, no carotid bruit, no JVD, supple, symmetrical, trachea midline and thyroid not enlarged, symmetric, no tenderness/mass/nodules Lymph nodes: Cervical, supraclavicular, and axillary nodes normal. Heart:regular rate and rhythm, S1, S2 normal, no murmur, click, rub or gallop Lung:chest clear, no wheezing, rales, normal symmetric air entry Abdomin: soft, non-tender, without masses or organomegaly EXT:no erythema, induration, or nodules   Lab Results: Lab Results  Component Value Date   WBC 5.5 08/13/2010   HGB 13.9  08/13/2010   HCT 41.8 08/13/2010   MCV 91.1 08/13/2010   PLT 169.0 08/13/2010     Chemistry      Component Value Date/Time   NA 140 08/13/2010 0744   K 4.2 08/13/2010 0744   CL 108 08/13/2010 0744   CO2 26 08/13/2010 0744   BUN 14 08/13/2010 0744    CREATININE 1.0 08/13/2010 0744      Component Value Date/Time   CALCIUM 9.2 08/13/2010 0744   ALKPHOS 65 08/13/2010 0744   AST 22 08/13/2010 0744   ALT 23 08/13/2010 0744   BILITOT 0.8 08/13/2010 0744        Impression and Plan:   This is a pleasant, 56 year old female with the following issues.  She has history of 2 unprovoked lower extremity DVTs.  The last one was right lower extremity diagnosed in January 2011.  She does have a factor V Leiden which is heterozygous.  Currently anticoagulated with Coumadin.  I continued to have discussion about risks and benefits of continuous anticoagulation.  At this time, given the fact that she does not report any toxicity associated with Coumadin, the risks of bleeding are less than risk of thrombosis given the fact that she had 2 unprovoked blood clots previously.  At this time, she is more comfortable with being on Coumadin than off.  This is certainly a decision I agree with.  I will consider using other agents (such as Pradaxa, Xarelto, etc) only if she fails coumadin.    Followup will be in 9 months time.  Eli Hose, MD 2/8/20134:35 PM

## 2011-03-10 ENCOUNTER — Ambulatory Visit (INDEPENDENT_AMBULATORY_CARE_PROVIDER_SITE_OTHER): Payer: Private Health Insurance - Indemnity | Admitting: Pharmacist

## 2011-03-10 DIAGNOSIS — I82409 Acute embolism and thrombosis of unspecified deep veins of unspecified lower extremity: Secondary | ICD-10-CM

## 2011-03-10 LAB — POCT INR: INR: 1.8

## 2011-03-31 ENCOUNTER — Ambulatory Visit (INDEPENDENT_AMBULATORY_CARE_PROVIDER_SITE_OTHER): Payer: Private Health Insurance - Indemnity | Admitting: Pharmacist

## 2011-03-31 DIAGNOSIS — I82409 Acute embolism and thrombosis of unspecified deep veins of unspecified lower extremity: Secondary | ICD-10-CM

## 2011-04-28 ENCOUNTER — Ambulatory Visit (INDEPENDENT_AMBULATORY_CARE_PROVIDER_SITE_OTHER): Payer: Private Health Insurance - Indemnity | Admitting: *Deleted

## 2011-04-28 DIAGNOSIS — I82409 Acute embolism and thrombosis of unspecified deep veins of unspecified lower extremity: Secondary | ICD-10-CM

## 2011-05-26 ENCOUNTER — Ambulatory Visit (INDEPENDENT_AMBULATORY_CARE_PROVIDER_SITE_OTHER): Payer: Private Health Insurance - Indemnity

## 2011-05-26 DIAGNOSIS — I82409 Acute embolism and thrombosis of unspecified deep veins of unspecified lower extremity: Secondary | ICD-10-CM

## 2011-05-26 LAB — POCT INR: INR: 3.5

## 2011-06-25 ENCOUNTER — Ambulatory Visit (INDEPENDENT_AMBULATORY_CARE_PROVIDER_SITE_OTHER): Payer: Private Health Insurance - Indemnity | Admitting: *Deleted

## 2011-06-25 DIAGNOSIS — I82409 Acute embolism and thrombosis of unspecified deep veins of unspecified lower extremity: Secondary | ICD-10-CM

## 2011-06-25 LAB — POCT INR: INR: 2.5

## 2011-07-23 ENCOUNTER — Ambulatory Visit (INDEPENDENT_AMBULATORY_CARE_PROVIDER_SITE_OTHER): Payer: Private Health Insurance - Indemnity | Admitting: *Deleted

## 2011-07-23 DIAGNOSIS — I82409 Acute embolism and thrombosis of unspecified deep veins of unspecified lower extremity: Secondary | ICD-10-CM

## 2011-07-23 LAB — POCT INR: INR: 4.1

## 2011-08-20 ENCOUNTER — Ambulatory Visit (INDEPENDENT_AMBULATORY_CARE_PROVIDER_SITE_OTHER): Payer: Private Health Insurance - Indemnity | Admitting: *Deleted

## 2011-08-20 DIAGNOSIS — I82409 Acute embolism and thrombosis of unspecified deep veins of unspecified lower extremity: Secondary | ICD-10-CM

## 2011-09-17 ENCOUNTER — Ambulatory Visit (INDEPENDENT_AMBULATORY_CARE_PROVIDER_SITE_OTHER): Payer: Private Health Insurance - Indemnity | Admitting: Pharmacist

## 2011-09-17 DIAGNOSIS — I82409 Acute embolism and thrombosis of unspecified deep veins of unspecified lower extremity: Secondary | ICD-10-CM

## 2011-09-17 LAB — POCT INR: INR: 2.7

## 2011-10-01 ENCOUNTER — Encounter: Payer: Self-pay | Admitting: Pharmacist

## 2011-10-15 ENCOUNTER — Ambulatory Visit (INDEPENDENT_AMBULATORY_CARE_PROVIDER_SITE_OTHER): Payer: Private Health Insurance - Indemnity | Admitting: Pharmacist

## 2011-10-15 DIAGNOSIS — I82409 Acute embolism and thrombosis of unspecified deep veins of unspecified lower extremity: Secondary | ICD-10-CM

## 2011-11-04 ENCOUNTER — Telehealth: Payer: Self-pay | Admitting: Oncology

## 2011-11-04 ENCOUNTER — Ambulatory Visit (HOSPITAL_BASED_OUTPATIENT_CLINIC_OR_DEPARTMENT_OTHER): Payer: Private Health Insurance - Indemnity | Admitting: Oncology

## 2011-11-04 VITALS — BP 122/81 | HR 95 | Temp 96.7°F | Resp 18 | Ht 69.5 in | Wt 237.7 lb

## 2011-11-04 DIAGNOSIS — D6859 Other primary thrombophilia: Secondary | ICD-10-CM

## 2011-11-04 DIAGNOSIS — Z86718 Personal history of other venous thrombosis and embolism: Secondary | ICD-10-CM

## 2011-11-04 DIAGNOSIS — I8290 Acute embolism and thrombosis of unspecified vein: Secondary | ICD-10-CM

## 2011-11-04 NOTE — Progress Notes (Signed)
Hematology and Oncology Follow Up Visit  Megan Parrish [id number] 1955/06/25 56 y.o. 11/04/2011 4:27 PM  CC: Megan Ports A. Felicity Coyer, MD    Principle Diagnosis: This is a 55 year old female with history of deep vein thrombosis.  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  The last one was diagnosed in January 2011.   Current therapy: She is anticoagulated with therapeutic doses of Coumadin since 01/2009.  Interim History:  Megan Parrish presents today for a followup visit.  As mentioned she does have of unprovoked blood clots last of which was again in January 2011.  She has been on Coumadin since that time and we have decided that she really needs to be on long-term anticoagulation given the fact that she had really a thrombosis off anticoagulation.  She has been doing fairly well with Coumadin since the last time I saw her.  She had not reported any complications.  She did not report any bleeding.  She has not had any toxicities or hospitalization or any illnesses.  She has continued to perform activities of daily living without any hindrance or decline.  She had not had any subtherapeutic levels.  She is not reporting on any new clots since her last visit.    Medications: I have reviewed the patient's current medications. Current outpatient prescriptions:albuterol (PROAIR HFA) 108 (90 BASE) MCG/ACT inhaler, Inhale 2 puffs into the lungs 4 (four) times daily as needed.  , Disp: , Rfl: ;  ALPRAZolam (XANAX) 0.5 MG tablet, Take 0.5 mg by mouth every 8 (eight) hours as needed.  , Disp: , Rfl: ;  famotidine (PEPCID) 20 MG tablet, Take 20 mg by mouth 2 (two) times daily.  , Disp: , Rfl:  Fluticasone-Salmeterol (ADVAIR DISKUS) 100-50 MCG/DOSE AEPB, Inhale 1 puff into the lungs 2 (two) times daily.  , Disp: , Rfl: ;  lansoprazole (PREVACID) 15 MG capsule, Take 15 mg by mouth daily., Disp: , Rfl: ;  mometasone (ELOCON) 0.1 % cream, Apply topically daily.  , Disp: ,  Rfl: ;  Multiple Vitamin (MULTIVITAMIN) tablet, Take 1 tablet by mouth daily.  , Disp: , Rfl:  warfarin (COUMADIN) 5 MG tablet, Take as directed by Anticoagulation clinic , Disp: 180 tablet, Rfl: 3  Allergies:  Allergies  Allergen Reactions  . Codeine     Past Medical History, Surgical history, Social history, and Family History were reviewed and updated.  Review of Systems: Constitutional:  Negative for fever, chills, night sweats, anorexia, weight loss, pain. Cardiovascular: no chest pain or dyspnea on exertion Respiratory: no cough, shortness of breath, or wheezing Neurological: no TIA or stroke symptoms Dermatological: negative ENT: negative Skin: Negative. Gastrointestinal: no abdominal pain, change in bowel habits, or black or bloody stools Genito-Urinary: no dysuria, trouble voiding, or hematuria Hematological and Lymphatic: negative Breast: negative Musculoskeletal: negative Remaining ROS negative. Physical Exam: Blood pressure 122/81, pulse 95, temperature 96.7 F (35.9 C), temperature source Oral, resp. rate 18, height 5' 9.5" (1.765 m), weight 237 lb 11.2 oz (107.82 kg). ECOG:  General appearance: alert Head: Normocephalic, without obvious abnormality, atraumatic Neck: no adenopathy, no carotid bruit, no JVD, supple, symmetrical, trachea midline and thyroid not enlarged, symmetric, no tenderness/mass/nodules Lymph nodes: Cervical, supraclavicular, and axillary nodes normal. Heart:regular rate and rhythm, S1, S2 normal, no murmur, click, rub or gallop Lung:chest clear, no wheezing, rales, normal symmetric air entry Abdomin: soft, non-tender, without masses or organomegaly EXT:no erythema, induration, or nodules   Lab Results:  Lab Results  Component Value Date   WBC 5.5 08/13/2010   HGB 13.9 08/13/2010   HCT 41.8 08/13/2010   MCV 91.1 08/13/2010   PLT 169.0 08/13/2010     Chemistry      Component Value Date/Time   NA 140 08/13/2010 0744   K 4.2 08/13/2010 0744    CL 108 08/13/2010 0744   CO2 26 08/13/2010 0744   BUN 14 08/13/2010 0744   CREATININE 1.0 08/13/2010 0744      Component Value Date/Time   CALCIUM 9.2 08/13/2010 0744   ALKPHOS 65 08/13/2010 0744   AST 22 08/13/2010 0744   ALT 23 08/13/2010 0744   BILITOT 0.8 08/13/2010 0744        Impression and Plan:   56 year old female with the following issues.  She has history of 2 unprovoked lower extremity DVTs.  The last one was right lower extremity diagnosed in January 2011.  She does have a factor V Leiden which is heterozygous.  Currently anticoagulated with Coumadin.  I continued to have discussion about risks and benefits of continuous anticoagulation.  At this time, given the fact that she does not report any toxicity associated with Coumadin, the risks of bleeding are less than risk of thrombosis given the fact that she had 2 unprovoked blood clots previously.  At this time, she is more comfortable with being on Coumadin than off.  This is certainly a decision I agree with.  I will consider using other agents (such as Pradaxa, Xarelto, etc) only if she fails coumadin.   Followup will be in 12 months time.  Eli Hose, MD 10/8/20134:27 PM

## 2011-11-04 NOTE — Telephone Encounter (Signed)
gv and printed appt for pt for Oct 2014

## 2011-11-13 ENCOUNTER — Ambulatory Visit (INDEPENDENT_AMBULATORY_CARE_PROVIDER_SITE_OTHER): Payer: Private Health Insurance - Indemnity | Admitting: General Practice

## 2011-11-13 ENCOUNTER — Other Ambulatory Visit: Payer: Self-pay | Admitting: General Practice

## 2011-11-13 DIAGNOSIS — I82409 Acute embolism and thrombosis of unspecified deep veins of unspecified lower extremity: Secondary | ICD-10-CM

## 2011-11-13 MED ORDER — WARFARIN SODIUM 5 MG PO TABS: ORAL_TABLET | ORAL | Status: DC

## 2011-12-24 ENCOUNTER — Ambulatory Visit (INDEPENDENT_AMBULATORY_CARE_PROVIDER_SITE_OTHER): Payer: Private Health Insurance - Indemnity | Admitting: General Practice

## 2011-12-24 DIAGNOSIS — I82409 Acute embolism and thrombosis of unspecified deep veins of unspecified lower extremity: Secondary | ICD-10-CM

## 2012-01-23 ENCOUNTER — Ambulatory Visit (INDEPENDENT_AMBULATORY_CARE_PROVIDER_SITE_OTHER): Payer: Private Health Insurance - Indemnity | Admitting: General Practice

## 2012-01-23 DIAGNOSIS — I82409 Acute embolism and thrombosis of unspecified deep veins of unspecified lower extremity: Secondary | ICD-10-CM

## 2012-01-23 LAB — POCT INR: INR: 3.2

## 2012-02-20 ENCOUNTER — Ambulatory Visit (INDEPENDENT_AMBULATORY_CARE_PROVIDER_SITE_OTHER): Payer: Private Health Insurance - Indemnity | Admitting: General Practice

## 2012-02-20 DIAGNOSIS — I82409 Acute embolism and thrombosis of unspecified deep veins of unspecified lower extremity: Secondary | ICD-10-CM

## 2012-02-20 LAB — POCT INR: INR: 2.5

## 2012-03-03 ENCOUNTER — Other Ambulatory Visit: Payer: Self-pay | Admitting: Internal Medicine

## 2012-03-04 ENCOUNTER — Other Ambulatory Visit: Payer: Self-pay | Admitting: *Deleted

## 2012-03-04 MED ORDER — ALBUTEROL SULFATE HFA 108 (90 BASE) MCG/ACT IN AERS: 2.0000 | INHALATION_SPRAY | RESPIRATORY_TRACT | Status: DC | PRN

## 2012-03-04 NOTE — Telephone Encounter (Signed)
Left msg needing refill on her albuterol inhaler. Called pt back can refil 1 time only over due for follow-up appt with md.../lmb

## 2012-03-23 ENCOUNTER — Ambulatory Visit (INDEPENDENT_AMBULATORY_CARE_PROVIDER_SITE_OTHER): Payer: Private Health Insurance - Indemnity | Admitting: General Practice

## 2012-04-21 ENCOUNTER — Ambulatory Visit (INDEPENDENT_AMBULATORY_CARE_PROVIDER_SITE_OTHER): Payer: Private Health Insurance - Indemnity | Admitting: General Practice

## 2012-04-21 DIAGNOSIS — I82409 Acute embolism and thrombosis of unspecified deep veins of unspecified lower extremity: Secondary | ICD-10-CM

## 2012-06-02 ENCOUNTER — Ambulatory Visit (INDEPENDENT_AMBULATORY_CARE_PROVIDER_SITE_OTHER): Payer: Private Health Insurance - Indemnity | Admitting: General Practice

## 2012-06-02 DIAGNOSIS — I82409 Acute embolism and thrombosis of unspecified deep veins of unspecified lower extremity: Secondary | ICD-10-CM

## 2012-07-14 ENCOUNTER — Ambulatory Visit (INDEPENDENT_AMBULATORY_CARE_PROVIDER_SITE_OTHER): Payer: Private Health Insurance - Indemnity | Admitting: General Practice

## 2012-07-14 DIAGNOSIS — I82409 Acute embolism and thrombosis of unspecified deep veins of unspecified lower extremity: Secondary | ICD-10-CM

## 2012-08-18 ENCOUNTER — Ambulatory Visit (INDEPENDENT_AMBULATORY_CARE_PROVIDER_SITE_OTHER): Payer: Private Health Insurance - Indemnity | Admitting: Family Medicine

## 2012-08-18 DIAGNOSIS — I82409 Acute embolism and thrombosis of unspecified deep veins of unspecified lower extremity: Secondary | ICD-10-CM

## 2012-08-18 LAB — POCT INR: INR: 4.3

## 2012-08-25 ENCOUNTER — Other Ambulatory Visit: Payer: Self-pay | Admitting: Internal Medicine

## 2012-09-08 ENCOUNTER — Ambulatory Visit (INDEPENDENT_AMBULATORY_CARE_PROVIDER_SITE_OTHER): Payer: Private Health Insurance - Indemnity | Admitting: General Practice

## 2012-09-08 ENCOUNTER — Other Ambulatory Visit: Payer: Self-pay | Admitting: General Practice

## 2012-09-08 DIAGNOSIS — I82409 Acute embolism and thrombosis of unspecified deep veins of unspecified lower extremity: Secondary | ICD-10-CM

## 2012-09-08 LAB — POCT INR: INR: 2.9

## 2012-09-08 MED ORDER — ALBUTEROL SULFATE HFA 108 (90 BASE) MCG/ACT IN AERS: 2.0000 | INHALATION_SPRAY | RESPIRATORY_TRACT | Status: DC | PRN

## 2012-10-06 ENCOUNTER — Ambulatory Visit (INDEPENDENT_AMBULATORY_CARE_PROVIDER_SITE_OTHER): Payer: Private Health Insurance - Indemnity | Admitting: General Practice

## 2012-10-06 DIAGNOSIS — I82409 Acute embolism and thrombosis of unspecified deep veins of unspecified lower extremity: Secondary | ICD-10-CM

## 2012-10-06 LAB — POCT INR: INR: 3.4

## 2012-11-03 ENCOUNTER — Telehealth: Payer: Self-pay | Admitting: Oncology

## 2012-11-03 ENCOUNTER — Encounter (INDEPENDENT_AMBULATORY_CARE_PROVIDER_SITE_OTHER): Payer: Self-pay

## 2012-11-03 ENCOUNTER — Ambulatory Visit (HOSPITAL_BASED_OUTPATIENT_CLINIC_OR_DEPARTMENT_OTHER): Payer: Private Health Insurance - Indemnity | Admitting: Oncology

## 2012-11-03 VITALS — BP 146/81 | HR 87 | Temp 97.7°F | Resp 20 | Ht 69.5 in | Wt 253.2 lb

## 2012-11-03 DIAGNOSIS — Z86718 Personal history of other venous thrombosis and embolism: Secondary | ICD-10-CM

## 2012-11-03 DIAGNOSIS — D6859 Other primary thrombophilia: Secondary | ICD-10-CM

## 2012-11-03 DIAGNOSIS — I82409 Acute embolism and thrombosis of unspecified deep veins of unspecified lower extremity: Secondary | ICD-10-CM

## 2012-11-03 DIAGNOSIS — Z7901 Long term (current) use of anticoagulants: Secondary | ICD-10-CM

## 2012-11-03 NOTE — Progress Notes (Signed)
Hematology and Oncology Follow Up Visit  Megan Parrish [id number] 06/06/1955 57 y.o. 11/03/2012 4:22 PM  CC: Megan Ports A. Felicity Coyer, MD    Principle Diagnosis: This is a 57 year old female with history of deep vein thrombosis. 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 The last one was diagnosed in January 2011.   Current therapy: She is anticoagulated with therapeutic doses of Coumadin since 01/2009.  Interim History:  Megan Parrish presents today for a followup visit. She has been on Coumadin and has been doing fairly well with it. She had not reported any complications.  She did not report any bleeding.  She has not had any toxicities or hospitalization or any illnesses.  She has continued to perform activities of daily living without any hindrance or decline.  She had not had any subtherapeutic levels. She is not reporting on any new clots since her last visit. She reports that both her brother and her father have developed a deep vein thrombosis and currently on chronic anticoagulation.   Medications: I have reviewed the patient's current medications.  Current Outpatient Prescriptions  Medication Sig Dispense Refill  . ADVAIR DISKUS 100-50 MCG/DOSE AEPB USE 1 INHALATION ORALLY    TWICE DAILY  180 each  3  . albuterol (PROAIR HFA) 108 (90 BASE) MCG/ACT inhaler Inhale 2 puffs into the lungs every 4 (four) hours as needed.  18 g  0  . ALPRAZolam (XANAX) 0.5 MG tablet Take 0.5 mg by mouth every 8 (eight) hours as needed.        . famotidine (PEPCID) 20 MG tablet Take 20 mg by mouth 2 (two) times daily.        . lansoprazole (PREVACID) 15 MG capsule Take 15 mg by mouth daily.      . mometasone (ELOCON) 0.1 % cream Apply topically daily.        . Multiple Vitamin (MULTIVITAMIN) tablet Take 1 tablet by mouth daily.        Marland Kitchen warfarin (COUMADIN) 5 MG tablet Take as directed by Anticoagulation clinic  180 tablet  3   No current facility-administered  medications for this visit.    Allergies:  Allergies  Allergen Reactions  . Codeine     Past Medical History, Surgical history, Social history, and Family History were reviewed and updated.  Review of Systems:  Remaining ROS negative.  Physical Exam: Blood pressure 146/81, pulse 87, temperature 97.7 F (36.5 C), temperature source Oral, resp. rate 20, height 5' 9.5" (1.765 m), weight 253 lb 3.2 oz (114.851 kg). ECOG: 0 General appearance: alert Head: Normocephalic, without obvious abnormality, atraumatic Neck: no adenopathy, no carotid bruit, no JVD, supple, symmetrical, trachea midline and thyroid not enlarged, symmetric, no tenderness/mass/nodules Lymph nodes: Cervical, supraclavicular, and axillary nodes normal. Heart:regular rate and rhythm, S1, S2 normal, no murmur, click, rub or gallop Lung:chest clear, no wheezing, rales, normal symmetric air entry Abdomin: soft, non-tender, without masses or organomegaly EXT:no erythema, induration, or nodules   Lab Results: Lab Results  Component Value Date   WBC 5.5 08/13/2010   HGB 13.9 08/13/2010   HCT 41.8 08/13/2010   MCV 91.1 08/13/2010   PLT 169.0 08/13/2010     Chemistry      Component Value Date/Time   NA 140 08/13/2010 0744   K 4.2 08/13/2010 0744   CL 108 08/13/2010 0744   CO2 26 08/13/2010 0744   BUN 14 08/13/2010 0744   CREATININE 1.0 08/13/2010 0744  Component Value Date/Time   CALCIUM 9.2 08/13/2010 0744   ALKPHOS 65 08/13/2010 0744   AST 22 08/13/2010 0744   ALT 23 08/13/2010 0744   BILITOT 0.8 08/13/2010 0744       Impression and Plan:   57 year old female with the following issues.  She has history of 2 unprovoked lower extremity DVTs.  The last one was right lower extremity diagnosed in January 2011.  She does have a factor V Leiden which is heterozygous.  Currently anticoagulated with Coumadin.  I continued to have discussion about risks and benefits of continuous anticoagulation.  At this time, given the  fact that she does not report any toxicity associated with Coumadin, the risks of bleeding are less than risk of thrombosis given the fact that she had 2 unprovoked blood clots previously.  At this time, she is more comfortable with being on Coumadin than off.    We can consider using other agents (such as Pradaxa, Xarelto, etc) only if she fails coumadin.   Followup will be in 12 months time.  Megan Hose, MD 10/8/20144:22 PM

## 2012-11-03 NOTE — Telephone Encounter (Signed)
gave pt appt for lab and MD on APril 2014

## 2012-11-15 ENCOUNTER — Other Ambulatory Visit: Payer: Self-pay | Admitting: Internal Medicine

## 2012-11-18 ENCOUNTER — Telehealth: Payer: Self-pay | Admitting: Internal Medicine

## 2012-11-18 MED ORDER — ALBUTEROL SULFATE HFA 108 (90 BASE) MCG/ACT IN AERS: 2.0000 | INHALATION_SPRAY | RESPIRATORY_TRACT | Status: DC | PRN

## 2012-11-18 NOTE — Telephone Encounter (Signed)
Pt made an appt for a physical on Nov 3.  Could a refill be called in for albuterol to [facility] in [place] to last until the appt?

## 2012-11-18 NOTE — Telephone Encounter (Signed)
Notified pt rx sent to pharmacy../lmb 

## 2012-11-19 ENCOUNTER — Ambulatory Visit (INDEPENDENT_AMBULATORY_CARE_PROVIDER_SITE_OTHER): Payer: Private Health Insurance - Indemnity | Admitting: Family Medicine

## 2012-11-19 DIAGNOSIS — I82409 Acute embolism and thrombosis of unspecified deep veins of unspecified lower extremity: Secondary | ICD-10-CM

## 2012-11-19 LAB — POCT INR: INR: 3.1

## 2012-11-24 LAB — LIPID PANEL: Triglycerides: 161 mg/dL — AB (ref 40–160)

## 2012-11-29 ENCOUNTER — Ambulatory Visit (INDEPENDENT_AMBULATORY_CARE_PROVIDER_SITE_OTHER): Payer: Private Health Insurance - Indemnity | Admitting: Internal Medicine

## 2012-11-29 ENCOUNTER — Encounter: Payer: Self-pay | Admitting: Internal Medicine

## 2012-11-29 VITALS — BP 110/72 | HR 79 | Temp 98.3°F | Ht 69.5 in | Wt 246.8 lb

## 2012-11-29 DIAGNOSIS — Z1239 Encounter for other screening for malignant neoplasm of breast: Secondary | ICD-10-CM

## 2012-11-29 DIAGNOSIS — J45909 Unspecified asthma, uncomplicated: Secondary | ICD-10-CM

## 2012-11-29 DIAGNOSIS — E669 Obesity, unspecified: Secondary | ICD-10-CM | POA: Insufficient documentation

## 2012-11-29 DIAGNOSIS — I82401 Acute embolism and thrombosis of unspecified deep veins of right lower extremity: Secondary | ICD-10-CM

## 2012-11-29 DIAGNOSIS — Z0000 Encounter for general adult medical examination without abnormal findings: Secondary | ICD-10-CM

## 2012-11-29 DIAGNOSIS — Z124 Encounter for screening for malignant neoplasm of cervix: Secondary | ICD-10-CM

## 2012-11-29 DIAGNOSIS — Z1211 Encounter for screening for malignant neoplasm of colon: Secondary | ICD-10-CM

## 2012-11-29 DIAGNOSIS — E785 Hyperlipidemia, unspecified: Secondary | ICD-10-CM

## 2012-11-29 DIAGNOSIS — I82409 Acute embolism and thrombosis of unspecified deep veins of unspecified lower extremity: Secondary | ICD-10-CM

## 2012-11-29 MED ORDER — FLUTICASONE-SALMETEROL 100-50 MCG/DOSE IN AEPB: 1.0000 | INHALATION_SPRAY | Freq: Two times a day (BID) | RESPIRATORY_TRACT | Status: DC

## 2012-11-29 NOTE — Assessment & Plan Note (Signed)
Wt Readings from Last 3 Encounters:  11/29/12 246 lb 12.8 oz (111.948 kg)  11/03/12 253 lb 3.2 oz (114.851 kg)  11/04/11 237 lb 11.2 oz (107.82 kg)   The patient is asked to make an attempt to improve diet and exercise patterns to aid in medical management of this problem.

## 2012-11-29 NOTE — Patient Instructions (Addendum)
It was good to see you today.  We have reviewed your prior records including labs and tests today  Health Maintenance reviewed - all recommended immunizations and age-appropriate screenings are up-to-date or declined.  Will refer for mammography screening. My office will call regarding this appointment Take home stool cards to check in lieu of screening colonoscopy at this time  Refer for gynecology exam as discussed - my office will call regarding this appointment  Medications reviewed and updated, no changes recommended at this time. Refill on medication(s) as discussed today.  Work on lifestyle changes as discussed (low carb, increased protein diet; improved exercise efforts; weight loss) to control sugar, blood pressure and cholesterol levels and/or reduce risk of developing other medical problems. Look into [email or web address] or other type of food journal to assist you in this process.  Please schedule followup in 6 months For cholesterol recheck and weight check, call sooner if problems.   Health Maintenance, Females A healthy lifestyle and preventative care can promote health and wellness.  Maintain regular health, dental, and eye exams.  Eat a healthy diet. Foods like vegetables, fruits, whole grains, low-fat dairy products, and lean protein foods contain the nutrients you need without too many calories. Decrease your intake of foods high in solid fats, added sugars, and salt. Get information about a proper diet from your caregiver, if necessary.  Regular physical exercise is one of the most important things you can do for your health. Most adults should get at least 150 minutes of moderate-intensity exercise (any activity that increases your heart rate and causes you to sweat) each week. In addition, most adults need muscle-strengthening exercises on 2 or more days a week.   Maintain a healthy weight. The body mass index (BMI) is a screening tool to identify possible weight  problems. It provides an estimate of body fat based on height and weight. Your caregiver can help determine your BMI, and can help you achieve or maintain a healthy weight. For adults 20 years and older:  A BMI below 18.5 is considered underweight.  A BMI of 18.5 to 24.9 is normal.  A BMI of 25 to 29.9 is considered overweight.  A BMI of 30 and above is considered obese.  Maintain normal blood lipids and cholesterol by exercising and minimizing your intake of saturated fat. Eat a balanced diet with plenty of fruits and vegetables. Blood tests for lipids and cholesterol should begin at age 35 and be repeated every 5 years. If your lipid or cholesterol levels are high, you are over 50, or you are a high risk for heart disease, you may need your cholesterol levels checked more frequently.Ongoing high lipid and cholesterol levels should be treated with medicines if diet and exercise are not effective.  If you smoke, find out from your caregiver how to quit. If you do not use tobacco, do not start.  If you are pregnant, do not drink alcohol. If you are breastfeeding, be very cautious about drinking alcohol. If you are not pregnant and choose to drink alcohol, do not exceed 1 drink per day. One drink is considered to be 12 ounces (355 mL) of beer, 5 ounces (148 mL) of wine, or 1.5 ounces (44 mL) of liquor.  Avoid use of street drugs. Do not share needles with anyone. Ask for help if you need support or instructions about stopping the use of drugs.  High blood pressure causes heart disease and increases the risk of stroke. Blood pressure should be checked at  least every 1 to 2 years. Ongoing high blood pressure should be treated with medicines, if weight loss and exercise are not effective.  If you are 4 to 56 years old, ask your caregiver if you should take aspirin to prevent strokes.  Diabetes screening involves taking a blood sample to check your fasting blood sugar level. This should be done  once every 3 years, after age 43, if you are within normal weight and without risk factors for diabetes. Testing should be considered at a younger age or be carried out more frequently if you are overweight and have at least 1 risk factor for diabetes.  Breast cancer screening is essential preventative care for women. You should practice "breast self-awareness." This means understanding the normal appearance and feel of your breasts and may include breast self-examination. Any changes detected, no matter how small, should be reported to a caregiver. Women in their 39s and 30s should have a clinical breast exam (CBE) by a caregiver as part of a regular health exam every 1 to 3 years. After age 64, women should have a CBE every year. Starting at age 65, women should consider having a mammogram (breast X-ray) every year. Women who have a family history of breast cancer should talk to their caregiver about genetic screening. Women at a high risk of breast cancer should talk to their caregiver about having an MRI and a mammogram every year.  The Pap test is a screening test for cervical cancer. Women should have a Pap test starting at age 22. Between ages 72 and 36, Pap tests should be repeated every 2 years. Beginning at age 29, you should have a Pap test every 3 years as long as the past 3 Pap tests have been normal. If you had a hysterectomy for a problem that was not cancer or a condition that could lead to cancer, then you no longer need Pap tests. If you are between ages 70 and 79, and you have had normal Pap tests going back 10 years, you no longer need Pap tests. If you have had past treatment for cervical cancer or a condition that could lead to cancer, you need Pap tests and screening for cancer for at least 20 years after your treatment. If Pap tests have been discontinued, risk factors (such as a new sexual partner) need to be reassessed to determine if screening should be resumed. Some women have medical  problems that increase the chance of getting cervical cancer. In these cases, your caregiver may recommend more frequent screening and Pap tests.  The human papillomavirus (HPV) test is an additional test that may be used for cervical cancer screening. The HPV test looks for the virus that can cause the cell changes on the cervix. The cells collected during the Pap test can be tested for HPV. The HPV test could be used to screen women aged 40 years and older, and should be used in women of any age who have unclear Pap test results. After the age of 42, women should have HPV testing at the same frequency as a Pap test.  Colorectal cancer can be detected and often prevented. Most routine colorectal cancer screening begins at the age of 82 and continues through age 63. However, your caregiver may recommend screening at an earlier age if you have risk factors for colon cancer. On a yearly basis, your caregiver may provide home test kits to check for hidden blood in the stool. Use of a small camera at  the end of a tube, to directly examine the colon (sigmoidoscopy or colonoscopy), can detect the earliest forms of colorectal cancer. Talk to your caregiver about this at age 19, when routine screening begins. Direct examination of the colon should be repeated every 5 to 10 years through age 37, unless early forms of pre-cancerous polyps or small growths are found.  Hepatitis C blood testing is recommended for all people born from [birth year] through 1964 and any individual with known risks for hepatitis C.  Practice safe sex. Use condoms and avoid high-risk sexual practices to reduce the spread of sexually transmitted infections (STIs). Sexually active women aged 70 and younger should be checked for Chlamydia, which is a common sexually transmitted infection. Older women with new or multiple partners should also be tested for Chlamydia. Testing for other STIs is recommended if you are sexually active and at increased  risk.  Osteoporosis is a disease in which the bones lose minerals and strength with aging. This can result in serious bone fractures. The risk of osteoporosis can be identified using a bone density scan. Women ages 87 and over and women at risk for fractures or osteoporosis should discuss screening with their caregivers. Ask your caregiver whether you should be taking a calcium supplement or vitamin D to reduce the rate of osteoporosis.  Menopause can be associated with physical symptoms and risks. Hormone replacement therapy is available to decrease symptoms and risks. You should talk to your caregiver about whether hormone replacement therapy is right for you.  Use sunscreen with a sun protection factor (SPF) of 30 or greater. Apply sunscreen liberally and repeatedly throughout the day. You should seek shade when your shadow is shorter than you. Protect yourself by wearing long sleeves, pants, a wide-brimmed hat, and sunglasses year round, whenever you are outdoors.  Notify your caregiver of new moles or changes in moles, especially if there is a change in shape or color. Also notify your caregiver if a mole is larger than the size of a pencil eraser.  Stay current with your immunizations. Document Released: 07/29/2010 Document Revised: 04/07/2011 Document Reviewed: 07/29/2010 [facility] Patient Information 2014 [place], [place]. Guaiac Test A guaiac test checks for blood in the poop (bowel movement). BEFORE THE TEST  Avoid alcohol and iron pills.  Only take medicine as told by your doctor.  Ask your doctor about stopping or changing medicines.  Avoid Vitamin C pills or medicines.  Avoid antiseptic solution that has iodine in it.  Avoid citrus fruits and juices. TEST  Put the sample cards and sticks in the bathroom so they will be ready when you have to poop.  Collect a poop sample with the stick.  Smear a small amount of poop on the card.  Take another sample from a different part  of the poop.  Store the cards with the samples in a plastic bag.  Throw the rest of the poop into the toilet and flush.  Wash your hands well. AFTER THE TEST  Bring the cards in a plastic bag to your clinic.  Do not wait more than 3 days to take the card to your clinic. Document Released: 10/23/2007 Document Revised: 04/07/2011 Document Reviewed: 10/23/2007 [facility] Patient Information 2014 [place], [place].

## 2012-11-29 NOTE — Progress Notes (Signed)
Subjective:    Patient ID: Megan Parrish, female    DOB: 1955/08/08, 57 y.o.   MRN: [id number]  HPI  Pt here today for annual physical.  Chronic medical problems also reviewed -  1.  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 Follows with heme ([facility]), on lifelong Coumadin therapy.  Reports compliance with current medical plan.  No bleeding complications reported.  2.  Thyroid nodules - diagnosed with ultrasound 08/2008-02/2009.  Last ultrasound 02/2010 without change.    3.  GERD - pt currently taking Nexium OTC for symptom relief.  Reports good control of symptoms with current therapy.   4.  Hyperlipidemia - noted on screening labs for work.  Pt with recent 7 pound weight loss in last month.  She is making efforts at increasing exercise and improving diet.   5.  Asthma - pt has been off Advair for several months due to cost.  She is having to use Albuterol with more frequency during seasonal fall allergies.    Past Medical History  Diagnosis Date  . 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 Palpitations   . THYROID NODULE 08/2008  . GERD   . ASTHMA   . Anxiety state, unspecified   . ALLERGIC RHINITIS   . Factor V deficiency     chronic anticoag    Family History  Problem Relation Age of Onset  . Arthritis Other     Parent  . Uterine cancer Other     grandparent  . Dementia Father   . Hypertension Father    History  Substance Use Topics  . Smoking status: Former Smoker -- 1.00 packs/day for 30 years    Types: Cigarettes    Quit date: 04/13/2008  . Smokeless tobacco: Not on file     Comment: single, lives alone 1 dog and 3 cats. 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 Alcohol Use: No    Review of Systems  Constitutional: Negative for fever, chills, activity change and appetite change.  HENT: Negative for congestion and rhinorrhea.   Respiratory: Positive for wheezing (occ). Negative for chest tightness and  shortness of breath.   Cardiovascular: Positive for leg swelling (chronic right LE edema). Negative for chest pain and palpitations.  Gastrointestinal: Negative for nausea, vomiting, diarrhea and constipation.  Endocrine: Negative for cold intolerance and heat intolerance.  Neurological: Negative for dizziness, syncope, light-headedness and headaches.  All other systems reviewed and are negative.       Objective:   Physical Exam  Constitutional: She is oriented to person, place, and time. She appears well-developed and well-nourished. No distress.  HENT:  Head: Normocephalic and atraumatic.  Right Ear: External ear normal.  Left Ear: External ear normal.  Nose: Nose normal.  Mouth/Throat: Oropharynx is clear and moist. No oropharyngeal exudate.  Eyes: Conjunctivae and EOM are normal. Pupils are equal, round, and reactive to light. Right eye exhibits no discharge. Left eye exhibits no discharge. No scleral icterus.  Neck: Normal range of motion. Neck supple. No thyromegaly present.  Cardiovascular: Normal rate and regular rhythm.   No murmur heard. Pulmonary/Chest: Effort normal and breath sounds normal. No respiratory distress. She has no wheezes.  Abdominal: Soft. Bowel sounds are normal. She exhibits no distension and no mass. There is no tenderness.  Musculoskeletal: Normal range of motion. She exhibits no edema and no tenderness.  Lymphadenopathy:    She has no  cervical adenopathy.  Neurological: She is alert and oriented to person, place, and time.  Skin: Skin is warm and dry. No rash noted. She is not diaphoretic.  Psychiatric: She has a normal mood and affect. Her behavior is normal. Judgment and thought content normal.    Wt Readings from Last 3 Encounters:  11/29/12 246 lb 12.8 oz (111.948 kg)  11/03/12 253 lb 3.2 oz (114.851 kg)  11/04/11 237 lb 11.2 oz (107.82 kg)   BP Readings from Last 3 Encounters:  11/29/12 110/72  11/03/12 146/81  11/04/11 122/81   Lab  Results  Component Value Date   WBC 5.5 08/13/2010   HGB 13.9 08/13/2010   HCT 41.8 08/13/2010   PLT 169.0 08/13/2010   GLUCOSE 106* 08/13/2010   CHOL 284* 11/24/2012   TRIG 161* 11/24/2012   HDL 60 11/24/2012   LDLDIRECT 174.5 08/13/2010   LDLCALC 192 11/24/2012   ALT 23 08/13/2010   AST 22 08/13/2010   NA 140 08/13/2010   K 4.2 08/13/2010   CL 108 08/13/2010   CREATININE 1.0 08/13/2010   BUN 14 08/13/2010   CO2 26 08/13/2010   TSH 1.15 08/13/2010   INR 3.1 11/19/2012        Assessment & Plan:   CPX/v70.0 - Patient has been counseled on age-appropriate routine health concerns for screening and prevention. These are reviewed and up-to-date. Immunizations are up-to-date or declined. Labs ordered and reviewed. Declines colonoscopy, will check home stool cards Refer for mammography and for followup gynecology  Also see problem list. Medications and labs reviewed today.

## 2012-11-29 NOTE — Assessment & Plan Note (Signed)
Dx CPX 07/2010 - reviewed adverse trend with interval weight gain continue diet and exercise with improved attention to weight reduction for life style control of same (down 7# with inc walking per pt report) Discussed potential benefit of statin medication given FH CAD, but pt declines statin Recheck 6-12 mo - consider statin tx if still elevated LDL

## 2012-11-29 NOTE — Progress Notes (Signed)
Pre-visit discussion using our clinic review tool. No additional management support is needed unless otherwise documented below in the visit note.  

## 2012-11-29 NOTE — Assessment & Plan Note (Signed)
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

## 2012-11-29 NOTE — Assessment & Plan Note (Signed)
Seasonal symptoms, intermittent Encourage resumed use of Advair during fall and spring season Continue rescue inhaler as needed

## 2012-12-31 ENCOUNTER — Ambulatory Visit (INDEPENDENT_AMBULATORY_CARE_PROVIDER_SITE_OTHER): Payer: Private Health Insurance - Indemnity | Admitting: General Practice

## 2012-12-31 DIAGNOSIS — I82409 Acute embolism and thrombosis of unspecified deep veins of unspecified lower extremity: Secondary | ICD-10-CM

## 2012-12-31 NOTE — Progress Notes (Signed)
Pre-visit discussion using our clinic review tool. No additional management support is needed unless otherwise documented below in the visit note.  

## 2013-01-25 ENCOUNTER — Telehealth: Payer: Self-pay | Admitting: *Deleted

## 2013-01-25 NOTE — Telephone Encounter (Signed)
LMTCB to schedule referral appt.

## 2013-02-02 NOTE — Telephone Encounter (Signed)
LMTCB to schedule incoming referral.

## 2013-02-11 ENCOUNTER — Ambulatory Visit (INDEPENDENT_AMBULATORY_CARE_PROVIDER_SITE_OTHER): Payer: Private Health Insurance - Indemnity | Admitting: General Practice

## 2013-02-11 DIAGNOSIS — I82409 Acute embolism and thrombosis of unspecified deep veins of unspecified lower extremity: Secondary | ICD-10-CM

## 2013-02-11 LAB — POCT INR: INR: 2.3

## 2013-02-11 NOTE — Progress Notes (Signed)
Pre-visit discussion using our clinic review tool. No additional management support is needed unless otherwise documented below in the visit note.  

## 2013-02-17 NOTE — Telephone Encounter (Signed)
Thank you for scheduling an appointment.. I see the encounter is closed.

## 2013-02-17 NOTE — Telephone Encounter (Signed)
Appt scheduled with dr Quincy Simmonds for 03-24-13.  Routing to provider for final review. Patient agreeable to disposition. Will close encounter

## 2013-03-15 ENCOUNTER — Other Ambulatory Visit: Payer: Self-pay | Admitting: Internal Medicine

## 2013-03-16 ENCOUNTER — Other Ambulatory Visit: Payer: Self-pay | Admitting: General Practice

## 2013-03-16 MED ORDER — WARFARIN SODIUM 5 MG PO TABS: ORAL_TABLET | ORAL | Status: DC

## 2013-03-17 ENCOUNTER — Telehealth: Payer: Self-pay | Admitting: *Deleted

## 2013-03-17 NOTE — Telephone Encounter (Signed)
Patient phoned needing refill for her coumadin.  Upon MAR review, it was submitted to her mail order pharmacy yesterday.  States she has enough of a supply, but will call back if she needs urgent supply sent to local pharmacy.

## 2013-03-18 ENCOUNTER — Telehealth: Payer: Self-pay | Admitting: *Deleted

## 2013-03-18 MED ORDER — WARFARIN SODIUM 5 MG PO TABS: ORAL_TABLET | ORAL | Status: DC

## 2013-03-18 NOTE — Telephone Encounter (Signed)
Pt phoned needing emergent supply of coumadin while waiting on mail order delivery.  E-scribed to local RX while speaking with pt.

## 2013-03-24 ENCOUNTER — Encounter: Payer: Private Health Insurance - Indemnity | Admitting: Obstetrics and Gynecology

## 2013-03-24 ENCOUNTER — Ambulatory Visit: Payer: Private Health Insurance - Indemnity

## 2013-04-08 ENCOUNTER — Ambulatory Visit (INDEPENDENT_AMBULATORY_CARE_PROVIDER_SITE_OTHER): Payer: Private Health Insurance - Indemnity | Admitting: General Practice

## 2013-04-08 DIAGNOSIS — Z5181 Encounter for therapeutic drug level monitoring: Secondary | ICD-10-CM

## 2013-04-08 DIAGNOSIS — I82409 Acute embolism and thrombosis of unspecified deep veins of unspecified lower extremity: Secondary | ICD-10-CM

## 2013-04-08 LAB — POCT INR
INR: 2.3
INR: 2.3

## 2013-04-08 NOTE — Progress Notes (Signed)
Pre visit review using our clinic review tool, if applicable. No additional management support is needed unless otherwise documented below in the visit note. 

## 2013-04-11 ENCOUNTER — Encounter: Payer: Self-pay | Admitting: Obstetrics and Gynecology

## 2013-04-11 ENCOUNTER — Ambulatory Visit (INDEPENDENT_AMBULATORY_CARE_PROVIDER_SITE_OTHER): Payer: Private Health Insurance - Indemnity | Admitting: Obstetrics and Gynecology

## 2013-04-11 VITALS — BP 120/82 | HR 70 | Resp 16 | Ht 67.5 in | Wt 250.0 lb

## 2013-04-11 DIAGNOSIS — Z0000 Encounter for general adult medical examination without abnormal findings: Secondary | ICD-10-CM

## 2013-04-11 DIAGNOSIS — Z01419 Encounter for gynecological examination (general) (routine) without abnormal findings: Secondary | ICD-10-CM

## 2013-04-11 DIAGNOSIS — N926 Irregular menstruation, unspecified: Secondary | ICD-10-CM

## 2013-04-11 LAB — POCT URINALYSIS DIPSTICK
Bilirubin, UA: NEGATIVE
Blood, UA: NEGATIVE
GLUCOSE UA: NEGATIVE
Ketones, UA: NEGATIVE
Leukocytes, UA: NEGATIVE
NITRITE UA: NEGATIVE
Protein, UA: NEGATIVE
Urobilinogen, UA: NEGATIVE
pH, UA: 5

## 2013-04-11 NOTE — Patient Instructions (Signed)

## 2013-04-11 NOTE — Progress Notes (Signed)
Patient ID: Megan Parrish, female   DOB: [birth year], 58 y.o.   MRN: [id number] GYNECOLOGY VISIT  PCP:   Gwendolyn Grant, MD  Referring provider:   HPI: 58 y.o.   Single  Caucasian  female   G0P0 with Patient's last menstrual period was 03/28/2013.   here for  AEX.   History of Factor V Leiden. Had a DVT in right leg in 1990 while on OCPs. Second DVT 4 years ago in left leg. 6 months later had another right leg DVT. On chronic coumadin. Family history of father and brother with DVTs.  Gained weight.  [employer] works for patient.  Likes to walk her dog.  Walks one hour per day and more on weekends.   Has a skin tag she would like removed.  On coumadin.  Last INR 2.3  Menses skipping.  LMP 7/14, 10/14, 12/14, 03/28/13. Sisters have had late menopause.   Hgb:   Through work every autumn. Urine:  Neg  GYNECOLOGIC HISTORY: Patient's last menstrual period was 03/28/2013. Sexually active:  no Partner preference: female Contraception:  abstinence  Menopausal hormone therapy: n/a DES exposure:   no Blood transfusions:   no Sexually transmitted diseases:   no GYN procedures and prior surgeries:  no Last mammogram:    2012 wnl:[facility] (through [facility])             Last pap and high risk HPV testing:2012 [id number] of HPV testing    History of abnormal pap smear: had abnormal pap years ago but pap normal on repeat.  No colposcopy or treatment to cervix.    OB History   Grav Para Term Preterm Abortions TAB SAB Ect Mult Living   0                LIFESTYLE: Exercise: walks everyday              Tobacco: no Alcohol:   1 drink per month Drug use:  no  OTHER HEALTH MAINTENANCE: Tetanus/TDap:  Within past 10 years Gardisil:             n/a Influenza:           never Zostavax:           n/a  Bone density:    n/a Colonoscopy:    never  Cholesterol check:  10/2012 elevated.  Family History  Problem Relation Age of Onset  . Arthritis Other     Parent  .  Uterine cancer Other     grandparent  . Dementia Father   . Hypertension Father   . Hypertension Mother   . Thyroid disease Mother     parathyroid cyst removed  . Ovarian cancer Maternal Grandmother     Patient Active Problem List   Diagnosis Date Noted  . Encounter for therapeutic drug monitoring 04/08/2013  . Obese   . Other and unspecified hyperlipidemia 08/22/2010  . DVT (deep venous thrombosis) 03/11/2010  . ANXIETY STATE, UNSPECIFIED 02/27/2010  . PALPITATIONS 02/27/2010  . DYSPNEA 02/22/2010  . THYROID NODULE 02/12/2009  . ALLERGIC RHINITIS 02/09/2009  . ASTHMA 02/09/2009  . GERD 02/09/2009  . SWELLING OF LIMB 02/07/2009   Past Medical History  Diagnosis Date  . 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 Palpitations   . THYROID NODULE 08/2008  . GERD   . ASTHMA   . Anxiety state, unspecified   . ALLERGIC RHINITIS   .  Factor V deficiency     chronic anticoag   . Fibroid     Past Surgical History  Procedure Laterality Date  . No past surgeries    . Perianal cyst removal  2001    ALLERGIES: Codeine  Current Outpatient Prescriptions  Medication Sig Dispense Refill  . albuterol (PROAIR HFA) 108 (90 BASE) MCG/ACT inhaler Inhale 2 puffs into the lungs every 4 (four) hours as needed.  18 g  0  . Esomeprazole Magnesium (NEXIUM 24HR PO) Take by mouth daily.      . Fluticasone-Salmeterol (ADVAIR DISKUS) 100-50 MCG/DOSE AEPB Inhale 1 puff into the lungs 2 (two) times daily.  180 each  3  . warfarin (COUMADIN) 5 MG tablet Take as directed by Anticoagulation clinic  30 tablet  0   No current facility-administered medications for this visit.     ROS:  Pertinent items are noted in HPI.  SOCIAL HISTORY:  Single.  Works for [employer] from home.   PHYSICAL EXAMINATION:    BP 120/82  Pulse 70  Resp 16  Ht 5' 7.5" (1.715 m)  Wt 250 lb (113.399 kg)  BMI 38.56 kg/m2  LMP 03/28/2013   Wt Readings from Last 3 Encounters:   04/11/13 250 lb (113.399 kg)  11/29/12 246 lb 12.8 oz (111.948 kg)  11/03/12 253 lb 3.2 oz (114.851 kg)     Ht Readings from Last 3 Encounters:  04/11/13 5' 7.5" (1.715 m)  11/29/12 5' 9.5" (1.765 m)  11/03/12 5' 9.5" (1.765 m)    General appearance: alert, cooperative and appears stated age Head: Normocephalic, without obvious abnormality, atraumatic Neck: no adenopathy, supple, symmetrical, trachea midline and thyroid not enlarged, symmetric, no tenderness/mass/nodules Lungs: clear to auscultation bilaterally Breasts: Inspection negative, No nipple retraction or dimpling, No nipple discharge or bleeding, No axillary or supraclavicular adenopathy, Normal to palpation without dominant masses Heart: regular rate and rhythm Abdomen: soft, non-tender; no masses,  no organomegaly Extremities: extremities normal, atraumatic, no cyanosis or edema Skin: Skin color, texture, turgor normal.  Brawny edema of the right lower extremity.  Lymph nodes: Cervical, supraclavicular, and axillary nodes normal. No abnormal inguinal nodes palpated Neurologic: Grossly normal  Pelvic: External genitalia:  no lesions              Urethra:  normal appearing urethra with no masses, tenderness or lesions              Bartholins and Skenes: normal                 Vagina: normal appearing vagina with normal color and discharge, no lesions              Cervix: normal appearance              Pap and high risk HPV testing done: yes.            Bimanual Exam:  Uterus:  uterus is normal size, shape, consistency and nontender                                      Adnexa: normal adnexa in size, nontender and no masses                                      Rectovaginal: Confirms  Anus:  normal sphincter tone, no lesions  ASSESSMENT  Normal gynecologic exam. Irregular menses.  Perimenopausal female. Weight gain.  Factor V Leiden mutation.  History of multiple DVTs.  On chronic  coumadin therapy.   PLAN  Mammogram recommended yearly.   Pt will schedule. Pap smear and high risk HPV testing performed.  Colonoscopy recommended. Counseled on self breast exam,  Weight loss and exercise. Recommend Dermatology visit to establish care for a routine skin check. Check FSH and estradiol. Return annually or prn   An After Visit Summary was printed and given to the patient.

## 2013-04-12 LAB — FOLLICLE STIMULATING HORMONE: FSH: 13.9 m[IU]/mL

## 2013-04-12 LAB — ESTRADIOL: Estradiol: 150.8 pg/mL

## 2013-04-15 LAB — IPS PAP TEST WITH HPV

## 2013-04-25 ENCOUNTER — Telehealth: Payer: Self-pay

## 2013-04-25 ENCOUNTER — Other Ambulatory Visit: Payer: Self-pay | Admitting: Obstetrics and Gynecology

## 2013-04-25 DIAGNOSIS — Z1231 Encounter for screening mammogram for malignant neoplasm of breast: Secondary | ICD-10-CM

## 2013-04-25 NOTE — Telephone Encounter (Signed)
LMOVM to call for lab results. 

## 2013-04-25 NOTE — Telephone Encounter (Signed)
Message copied by Lowella Fairy on Mon Apr 25, 2013  9:28 AM ------      Message from: [place], BROOK E      Created: Fri Apr 15, 2013  5:46 PM       Pap and HR HPV negative.      Recall - 02. ------

## 2013-04-27 NOTE — Telephone Encounter (Signed)
Patient given results on 04-25-13. See labs.

## 2013-05-20 ENCOUNTER — Ambulatory Visit (INDEPENDENT_AMBULATORY_CARE_PROVIDER_SITE_OTHER): Payer: Private Health Insurance - Indemnity | Admitting: General Practice

## 2013-05-20 DIAGNOSIS — Z5181 Encounter for therapeutic drug level monitoring: Secondary | ICD-10-CM

## 2013-05-20 DIAGNOSIS — I82409 Acute embolism and thrombosis of unspecified deep veins of unspecified lower extremity: Secondary | ICD-10-CM

## 2013-05-20 LAB — POCT INR: INR: 1.8

## 2013-05-20 NOTE — Progress Notes (Signed)
Pre visit review using our clinic review tool, if applicable. No additional management support is needed unless otherwise documented below in the visit note. 

## 2013-05-23 ENCOUNTER — Encounter (INDEPENDENT_AMBULATORY_CARE_PROVIDER_SITE_OTHER): Payer: Self-pay

## 2013-05-23 ENCOUNTER — Ambulatory Visit
Admission: RE | Admit: 2013-05-23 | Discharge: 2013-05-23 | Disposition: A | Discharge status: Home / Self Care | Payer: Private Health Insurance - Indemnity | Source: Ambulatory Visit | Attending: Obstetrics and Gynecology | Admitting: Obstetrics and Gynecology

## 2013-05-23 DIAGNOSIS — Z1231 Encounter for screening mammogram for malignant neoplasm of breast: Secondary | ICD-10-CM

## 2013-05-30 ENCOUNTER — Ambulatory Visit: Payer: Private Health Insurance - Indemnity | Admitting: Internal Medicine

## 2013-06-09 ENCOUNTER — Other Ambulatory Visit: Payer: Self-pay | Admitting: *Deleted

## 2013-06-09 MED ORDER — FLUTICASONE-SALMETEROL 100-50 MCG/DOSE IN AEPB: 1.0000 | INHALATION_SPRAY | Freq: Two times a day (BID) | RESPIRATORY_TRACT | Status: DC

## 2013-07-01 ENCOUNTER — Ambulatory Visit (INDEPENDENT_AMBULATORY_CARE_PROVIDER_SITE_OTHER): Payer: Private Health Insurance - Indemnity | Admitting: General Practice

## 2013-07-01 DIAGNOSIS — Z5181 Encounter for therapeutic drug level monitoring: Secondary | ICD-10-CM

## 2013-07-01 DIAGNOSIS — I82409 Acute embolism and thrombosis of unspecified deep veins of unspecified lower extremity: Secondary | ICD-10-CM

## 2013-07-01 LAB — POCT INR: INR: 2.6

## 2013-07-01 NOTE — Progress Notes (Signed)
Pre visit review using our clinic review tool, if applicable. No additional management support is needed unless otherwise documented below in the visit note. 

## 2013-08-19 ENCOUNTER — Ambulatory Visit (INDEPENDENT_AMBULATORY_CARE_PROVIDER_SITE_OTHER): Payer: Private Health Insurance - Indemnity | Admitting: General Practice

## 2013-08-19 DIAGNOSIS — Z5181 Encounter for therapeutic drug level monitoring: Secondary | ICD-10-CM

## 2013-08-19 DIAGNOSIS — I82409 Acute embolism and thrombosis of unspecified deep veins of unspecified lower extremity: Secondary | ICD-10-CM

## 2013-08-19 LAB — POCT INR: INR: 2

## 2013-08-19 NOTE — Progress Notes (Signed)
Pre visit review using our clinic review tool, if applicable. No additional management support is needed unless otherwise documented below in the visit note. 

## 2013-09-30 ENCOUNTER — Ambulatory Visit (INDEPENDENT_AMBULATORY_CARE_PROVIDER_SITE_OTHER): Payer: Private Health Insurance - Indemnity | Admitting: *Deleted

## 2013-09-30 DIAGNOSIS — I82409 Acute embolism and thrombosis of unspecified deep veins of unspecified lower extremity: Secondary | ICD-10-CM

## 2013-09-30 DIAGNOSIS — Z5181 Encounter for therapeutic drug level monitoring: Secondary | ICD-10-CM

## 2013-09-30 LAB — POCT INR: INR: 1.9

## 2013-10-31 ENCOUNTER — Telehealth: Payer: Self-pay | Admitting: Oncology

## 2013-10-31 NOTE — Telephone Encounter (Signed)
also lmonvm for pt on cell re new d/t from 10/8 to 10/29. schedule mailed. see previous note.

## 2013-10-31 NOTE — Telephone Encounter (Signed)
moved 10/8 appt to 10/29 due to [facility] TRAINING - date per FS. lmonvm for pt and mailed schedule.

## 2013-11-03 ENCOUNTER — Ambulatory Visit: Payer: Private Health Insurance - Indemnity | Admitting: Oncology

## 2013-11-09 ENCOUNTER — Ambulatory Visit (INDEPENDENT_AMBULATORY_CARE_PROVIDER_SITE_OTHER): Payer: Private Health Insurance - Indemnity | Admitting: *Deleted

## 2013-11-09 DIAGNOSIS — I82409 Acute embolism and thrombosis of unspecified deep veins of unspecified lower extremity: Secondary | ICD-10-CM

## 2013-11-09 DIAGNOSIS — Z5181 Encounter for therapeutic drug level monitoring: Secondary | ICD-10-CM

## 2013-11-09 LAB — POCT INR: INR: 2.1

## 2013-11-24 ENCOUNTER — Ambulatory Visit (HOSPITAL_BASED_OUTPATIENT_CLINIC_OR_DEPARTMENT_OTHER): Payer: Private Health Insurance - Indemnity | Admitting: Oncology

## 2013-11-24 ENCOUNTER — Telehealth: Payer: Self-pay | Admitting: Oncology

## 2013-11-24 VITALS — BP 126/77 | HR 86 | Temp 97.7°F | Resp 20 | Ht 67.5 in | Wt 246.5 lb

## 2013-11-24 DIAGNOSIS — Z86718 Personal history of other venous thrombosis and embolism: Secondary | ICD-10-CM

## 2013-11-24 DIAGNOSIS — I82409 Acute embolism and thrombosis of unspecified deep veins of unspecified lower extremity: Secondary | ICD-10-CM

## 2013-11-24 DIAGNOSIS — D6851 Activated protein C resistance: Secondary | ICD-10-CM

## 2013-11-24 NOTE — Progress Notes (Signed)
Hematology and Oncology Follow Up Visit  Megan Parrish [id number] April 15, 1955 58 y.o. 11/24/2013 10:44 AM  CC: Megan Flow A. Asa Lente, MD    Principle Diagnosis: This is a 58 year old female with history of deep vein thrombosis. 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 The last one was diagnosed in January 2011.   Current therapy: She is anticoagulated with therapeutic doses of Coumadin since 01/2009.  Interim History:  Megan Parrish presents today for a followup visit. Since the last visit, she reports no complications. She has been on Coumadin and has been doing fairly well. She did not report any bleeding including epistaxis, hematuria, hemoptysis or hematemesis..  She has not had any toxicities or hospitalization or any illnesses.  She has continued to perform activities of daily living without any hindrance or decline.  She had not had any subtherapeutic levels. She is not reporting on any new clots since her last visit. She does not report any headaches blurry vision or syncope. She does not report any chest pain, shortness of breath or palpitation. She does not report any nausea, vomiting, abdominal pain or change in her bowels. She does not report any genitourinary complaints. Rest of review of systems unremarkable.   Medications: I have reviewed the patient's current medications.  Current Outpatient Prescriptions  Medication Sig Dispense Refill  . albuterol (PROAIR HFA) 108 (90 BASE) MCG/ACT inhaler Inhale 2 puffs into the lungs every 4 (four) hours as needed.  18 g  0  . Esomeprazole Magnesium (NEXIUM 24HR PO) Take by mouth daily.      . Fluticasone-Salmeterol (ADVAIR) 100-50 MCG/DOSE AEPB Inhale 1 puff into the lungs as needed.      . warfarin (COUMADIN) 5 MG tablet Take as directed by Anticoagulation clinic  30 tablet  0   No current facility-administered medications for this visit.    Allergies:  Allergies  Allergen Reactions  .  Codeine     Past Medical History, Surgical history, Social history, and Family History were reviewed and updated.    Physical Exam: Blood pressure 126/77, pulse 86, temperature 97.7 F (36.5 C), temperature source Oral, resp. rate 20, height 5' 7.5" (1.715 m), weight 246 lb 8 oz (111.812 kg), SpO2 97.00%. ECOG: 0 General appearance: alert awake not in any distress. Head: Normocephalic, without obvious abnormality, atraumatic Neck: no adenopathy Lymph nodes: Cervical, supraclavicular, and axillary nodes normal. Heart:regular rate and rhythm, S1, S2 normal, no murmur, click, rub or gallop Lung:chest clear, no wheezing, rales, normal symmetric air entry Abdomin: soft, non-tender, without masses or organomegaly EXT:no erythema, induration, or nodules   Lab Results: Lab Results  Component Value Date   WBC 5.5 08/13/2010   HGB 13.9 08/13/2010   HCT 41.8 08/13/2010   MCV 91.1 08/13/2010   PLT 169.0 08/13/2010     Chemistry      Component Value Date/Time   NA 140 08/13/2010 0744   K 4.2 08/13/2010 0744   CL 108 08/13/2010 0744   CO2 26 08/13/2010 0744   BUN 14 08/13/2010 0744   CREATININE 1.0 08/13/2010 0744   GLU 96 11/24/2012      Component Value Date/Time   CALCIUM 9.2 08/13/2010 0744   ALKPHOS 65 08/13/2010 0744   AST 22 08/13/2010 0744   ALT 23 08/13/2010 0744   BILITOT 0.8 08/13/2010 0744       Impression and Plan:   58 year old female with  history of 2 unprovoked lower extremity DVTs. The  last one was right lower extremity diagnosed in January 2011.  She does have a factor V Leiden which is heterozygous.  Currently anticoagulated with Coumadin.  I discussed the risks and benefits of contining anticoagulation. Given the fact that she does not report any toxicity associated with Coumadin, the risks of bleeding are less than risk of thrombosis given the fact that she had 2 unprovoked blood clots previously.  At this time, she is more comfortable with being on Coumadin than off.     We can consider using other agents (such as Pradaxa, Xarelto, etc) only if she fails coumadin. I prefer not to use these agents at this time due to lack of clear-cut ways to reverse down in case of bleeding.  She is comfortable with this plan at this time. We will reevaluate this again in 12 months.    [facility], MD 10/29/201510:44 AM

## 2013-11-24 NOTE — Telephone Encounter (Signed)
Pt confirmed MD visit per 10/29 POF, gave pt AVS...Marland KitchenMarland KitchenMarland Kitchen KJ

## 2013-12-21 ENCOUNTER — Ambulatory Visit (INDEPENDENT_AMBULATORY_CARE_PROVIDER_SITE_OTHER): Payer: Private Health Insurance - Indemnity

## 2013-12-21 DIAGNOSIS — Z5181 Encounter for therapeutic drug level monitoring: Secondary | ICD-10-CM

## 2013-12-21 DIAGNOSIS — I82409 Acute embolism and thrombosis of unspecified deep veins of unspecified lower extremity: Secondary | ICD-10-CM

## 2013-12-21 DIAGNOSIS — I82403 Acute embolism and thrombosis of unspecified deep veins of lower extremity, bilateral: Secondary | ICD-10-CM

## 2013-12-21 LAB — POCT INR: INR: 1.5

## 2014-01-11 ENCOUNTER — Telehealth: Payer: Self-pay | Admitting: Family

## 2014-01-11 ENCOUNTER — Ambulatory Visit (INDEPENDENT_AMBULATORY_CARE_PROVIDER_SITE_OTHER): Payer: Private Health Insurance - Indemnity

## 2014-01-11 DIAGNOSIS — I82409 Acute embolism and thrombosis of unspecified deep veins of unspecified lower extremity: Secondary | ICD-10-CM

## 2014-01-11 DIAGNOSIS — Z5181 Encounter for therapeutic drug level monitoring: Secondary | ICD-10-CM

## 2014-01-11 LAB — POCT INR: INR: 1.4

## 2014-01-11 NOTE — Telephone Encounter (Signed)
Agree with plan 

## 2014-01-18 ENCOUNTER — Ambulatory Visit: Payer: Private Health Insurance - Indemnity | Admitting: Family Medicine

## 2014-01-18 DIAGNOSIS — I82409 Acute embolism and thrombosis of unspecified deep veins of unspecified lower extremity: Secondary | ICD-10-CM

## 2014-01-18 DIAGNOSIS — Z5181 Encounter for therapeutic drug level monitoring: Secondary | ICD-10-CM

## 2014-01-18 LAB — POCT INR: INR: 1.8

## 2014-02-01 ENCOUNTER — Ambulatory Visit (INDEPENDENT_AMBULATORY_CARE_PROVIDER_SITE_OTHER): Payer: Private Health Insurance - Indemnity | Admitting: Family Medicine

## 2014-02-01 DIAGNOSIS — Z5181 Encounter for therapeutic drug level monitoring: Secondary | ICD-10-CM

## 2014-02-01 DIAGNOSIS — I82409 Acute embolism and thrombosis of unspecified deep veins of unspecified lower extremity: Secondary | ICD-10-CM

## 2014-02-01 LAB — POCT INR: INR: 3.4

## 2014-03-01 ENCOUNTER — Ambulatory Visit (INDEPENDENT_AMBULATORY_CARE_PROVIDER_SITE_OTHER): Payer: Private Health Insurance - Indemnity | Admitting: General Practice

## 2014-03-01 DIAGNOSIS — Z5181 Encounter for therapeutic drug level monitoring: Secondary | ICD-10-CM

## 2014-03-01 DIAGNOSIS — I82409 Acute embolism and thrombosis of unspecified deep veins of unspecified lower extremity: Secondary | ICD-10-CM

## 2014-03-01 LAB — POCT INR: INR: 3.8

## 2014-03-01 NOTE — Progress Notes (Signed)
Pre visit review using our clinic review tool, if applicable. No additional management support is needed unless otherwise documented below in the visit note. 

## 2014-03-01 NOTE — Progress Notes (Signed)
Agree with plan 

## 2014-03-15 ENCOUNTER — Ambulatory Visit (INDEPENDENT_AMBULATORY_CARE_PROVIDER_SITE_OTHER): Payer: Private Health Insurance - Indemnity | Admitting: General Practice

## 2014-03-15 DIAGNOSIS — Z5181 Encounter for therapeutic drug level monitoring: Secondary | ICD-10-CM

## 2014-03-15 LAB — POCT INR: INR: 3.4

## 2014-03-15 NOTE — Progress Notes (Signed)
Agree with plan 

## 2014-03-15 NOTE — Progress Notes (Signed)
Pre visit review using our clinic review tool, if applicable. No additional management support is needed unless otherwise documented below in the visit note. 

## 2014-04-11 ENCOUNTER — Ambulatory Visit (INDEPENDENT_AMBULATORY_CARE_PROVIDER_SITE_OTHER): Payer: Private Health Insurance - Indemnity | Admitting: General Practice

## 2014-04-11 DIAGNOSIS — Z5181 Encounter for therapeutic drug level monitoring: Secondary | ICD-10-CM

## 2014-04-11 DIAGNOSIS — I82409 Acute embolism and thrombosis of unspecified deep veins of unspecified lower extremity: Secondary | ICD-10-CM

## 2014-04-11 LAB — POCT INR: INR: 3.8

## 2014-04-11 NOTE — Progress Notes (Signed)
Pre visit review using our clinic review tool, if applicable. No additional management support is needed unless otherwise documented below in the visit note. 

## 2014-04-11 NOTE — Progress Notes (Signed)
Agree with plan 

## 2014-04-19 ENCOUNTER — Ambulatory Visit: Payer: Private Health Insurance - Indemnity | Admitting: Obstetrics and Gynecology

## 2014-05-02 ENCOUNTER — Ambulatory Visit (INDEPENDENT_AMBULATORY_CARE_PROVIDER_SITE_OTHER): Payer: Private Health Insurance - Indemnity | Admitting: General Practice

## 2014-05-02 DIAGNOSIS — I82409 Acute embolism and thrombosis of unspecified deep veins of unspecified lower extremity: Secondary | ICD-10-CM

## 2014-05-02 DIAGNOSIS — Z5181 Encounter for therapeutic drug level monitoring: Secondary | ICD-10-CM

## 2014-05-02 LAB — POCT INR: INR: 3.5

## 2014-05-02 NOTE — Progress Notes (Signed)
Pre visit review using our clinic review tool, if applicable. No additional management support is needed unless otherwise documented below in the visit note. 

## 2014-05-02 NOTE — Progress Notes (Signed)
Agree with plan 

## 2014-05-30 ENCOUNTER — Other Ambulatory Visit: Payer: Self-pay | Admitting: Obstetrics and Gynecology

## 2014-05-30 ENCOUNTER — Ambulatory Visit (INDEPENDENT_AMBULATORY_CARE_PROVIDER_SITE_OTHER): Payer: Managed Care, Other (non HMO) | Admitting: General Practice

## 2014-05-30 DIAGNOSIS — Z1231 Encounter for screening mammogram for malignant neoplasm of breast: Secondary | ICD-10-CM

## 2014-05-30 DIAGNOSIS — I82409 Acute embolism and thrombosis of unspecified deep veins of unspecified lower extremity: Secondary | ICD-10-CM

## 2014-05-30 DIAGNOSIS — Z5181 Encounter for therapeutic drug level monitoring: Secondary | ICD-10-CM

## 2014-05-30 LAB — POCT INR: INR: 2

## 2014-05-30 NOTE — Progress Notes (Signed)
Pre visit review using our clinic review tool, if applicable. No additional management support is needed unless otherwise documented below in the visit note. 

## 2014-05-30 NOTE — Progress Notes (Signed)
Agree with plan 

## 2014-06-21 ENCOUNTER — Telehealth: Payer: Self-pay

## 2014-06-21 NOTE — Telephone Encounter (Signed)
Noted apt on Friday at 9:45 with Dr. Doug Sou; Call to the patient to see if she could come in early (around 9am) for wellness visit prior to her CPE with Dr. Doug Sou. Left VM for call back.  (If scheduled; will have to notify me of apt.)

## 2014-06-23 ENCOUNTER — Ambulatory Visit (INDEPENDENT_AMBULATORY_CARE_PROVIDER_SITE_OTHER): Payer: Managed Care, Other (non HMO) | Admitting: Internal Medicine

## 2014-06-23 ENCOUNTER — Ambulatory Visit (INDEPENDENT_AMBULATORY_CARE_PROVIDER_SITE_OTHER): Payer: Managed Care, Other (non HMO) | Admitting: Nurse Practitioner

## 2014-06-23 ENCOUNTER — Encounter: Payer: Self-pay | Admitting: Nurse Practitioner

## 2014-06-23 ENCOUNTER — Ambulatory Visit (HOSPITAL_COMMUNITY)
Admission: RE | Admit: 2014-06-23 | Discharge: 2014-06-23 | Disposition: A | Discharge status: Home / Self Care | Payer: Managed Care, Other (non HMO) | Source: Ambulatory Visit | Attending: Obstetrics and Gynecology | Admitting: Obstetrics and Gynecology

## 2014-06-23 ENCOUNTER — Encounter: Payer: Self-pay | Admitting: Internal Medicine

## 2014-06-23 VITALS — BP 114/64 | HR 81 | Temp 98.2°F | Resp 16 | Ht 69.0 in | Wt 245.0 lb

## 2014-06-23 VITALS — BP 126/74 | HR 64 | Ht 69.0 in | Wt 244.0 lb

## 2014-06-23 DIAGNOSIS — E785 Hyperlipidemia, unspecified: Secondary | ICD-10-CM

## 2014-06-23 DIAGNOSIS — E669 Obesity, unspecified: Secondary | ICD-10-CM

## 2014-06-23 DIAGNOSIS — I82409 Acute embolism and thrombosis of unspecified deep veins of unspecified lower extremity: Secondary | ICD-10-CM | POA: Diagnosis not present

## 2014-06-23 DIAGNOSIS — D259 Leiomyoma of uterus, unspecified: Secondary | ICD-10-CM | POA: Diagnosis not present

## 2014-06-23 DIAGNOSIS — Z01419 Encounter for gynecological examination (general) (routine) without abnormal findings: Secondary | ICD-10-CM

## 2014-06-23 DIAGNOSIS — K219 Gastro-esophageal reflux disease without esophagitis: Secondary | ICD-10-CM

## 2014-06-23 DIAGNOSIS — N921 Excessive and frequent menstruation with irregular cycle: Secondary | ICD-10-CM

## 2014-06-23 DIAGNOSIS — Z0000 Encounter for general adult medical examination without abnormal findings: Secondary | ICD-10-CM

## 2014-06-23 DIAGNOSIS — Z1231 Encounter for screening mammogram for malignant neoplasm of breast: Secondary | ICD-10-CM | POA: Diagnosis not present

## 2014-06-23 DIAGNOSIS — N926 Irregular menstruation, unspecified: Secondary | ICD-10-CM

## 2014-06-23 DIAGNOSIS — J4520 Mild intermittent asthma, uncomplicated: Secondary | ICD-10-CM

## 2014-06-23 LAB — POCT URINALYSIS DIPSTICK
Bilirubin, UA: NEGATIVE
Blood, UA: NEGATIVE
Glucose, UA: NEGATIVE
KETONES UA: NEGATIVE
Leukocytes, UA: NEGATIVE
NITRITE UA: NEGATIVE
PROTEIN UA: NEGATIVE
Urobilinogen, UA: NEGATIVE
pH, UA: 5

## 2014-06-23 MED ORDER — TRIAMCINOLONE ACETONIDE 0.1 % EX CREA: 1.0000 "application " | TOPICAL_CREAM | Freq: Two times a day (BID) | CUTANEOUS | Status: DC

## 2014-06-23 MED ORDER — ALBUTEROL SULFATE HFA 108 (90 BASE) MCG/ACT IN AERS: 2.0000 | INHALATION_SPRAY | RESPIRATORY_TRACT | Status: DC | PRN

## 2014-06-23 NOTE — Progress Notes (Signed)
Pre visit review using our clinic review tool, if applicable. No additional management support is needed unless otherwise documented below in the visit note. 

## 2014-06-23 NOTE — Addendum Note (Signed)
Addended by: Vertell Novak A on: 06/23/2014 09:15 AM   Modules accepted: Orders

## 2014-06-23 NOTE — Progress Notes (Signed)
Patient ID: Megan Parrish, female   DOB: 01/20/1956, 59 y.o.   MRN: [id number] 58 y.o. G0P0 Single  Caucasian Fe here for annual exam.  Mense is irregular and usually every 6-8 weeks.  Most times 5-7 days. 1-2 days heavier super plus tampon and super pad changing every hour then spotting at the end.   Occasionally longer with spotting.  This past cycle 1 day heavy then spotting for 2 weeks.  Some vaso symptoms 10-12 per day with night sweats.  Same partner for 33 years.    Patient's last menstrual period was 05/17/2014.          Sexually active: No.  The current method of family planning is abstinence.    Exercising: Yes.    walking every morning and zumba twice weekly Smoker:  no  Health Maintenance: Pap:  04/11/13, negative with neg HR HPV MMG:  05/23/13, Bi-Rads 1:  Negative, has appt today Colonoscopy:  Never - PCP gave her IFOB this am TDaP:  10/16/08 Labs:  Work  Urine:  Negative    reports that she quit smoking about 6 years ago. Her smoking use included Cigarettes. She has a 30 pack-year smoking history. She does not have any smokeless tobacco history on file. She reports that she drinks alcohol. She reports that she does not use illicit drugs.  Past Medical History  Diagnosis Date  . 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 Palpitations   . THYROID NODULE 08/2008  . GERD   . ASTHMA   . Anxiety state, unspecified   . ALLERGIC RHINITIS   . Factor V deficiency     chronic anticoag   . Fibroid     Past Surgical History  Procedure Laterality Date  . No past surgeries    . Perianal cyst removal  2001    Current Outpatient Prescriptions  Medication Sig Dispense Refill  . albuterol (PROAIR HFA) 108 (90 BASE) MCG/ACT inhaler Inhale 2 puffs into the lungs every 4 (four) hours as needed. 18 g 3  . esomeprazole (NEXIUM) 40 MG capsule Take 40 mg by mouth daily at 12 noon.    . Fluticasone-Salmeterol (ADVAIR) 100-50 MCG/DOSE AEPB Inhale 1 puff  into the lungs as needed.    . triamcinolone cream (KENALOG) 0.1 % Apply 1 application topically 2 (two) times daily. 80 g 2  . warfarin (COUMADIN) 5 MG tablet Take as directed by Anticoagulation clinic 30 tablet 0   No current facility-administered medications for this visit.    Family History  Problem Relation Age of Onset  . Arthritis Other     Parent  . Uterine cancer Other     grandparent  . Dementia Father   . Hypertension Father   . Hypertension Mother   . Thyroid disease Mother     parathyroid cyst removed  . Ovarian cancer Maternal Grandmother     ROS:  Pertinent items are noted in HPI.  Otherwise, a comprehensive ROS was negative.  Exam:   BP 126/74 mmHg  Pulse 64  Ht 5\' 9"  (1.753 m)  Wt 244 lb (110.678 kg)  BMI 36.02 kg/m2  LMP 05/17/2014 Height: 5\' 9"  (175.3 cm) Ht Readings from Last 3 Encounters:  06/23/14 5\' 9"  (1.753 m)  06/23/14 5\' 9"  (1.753 m)  11/24/13 5' 7.5" (1.715 m)    General appearance: alert, cooperative and appears stated age Head: Normocephalic, without obvious abnormality, atraumatic Neck: no adenopathy, supple, symmetrical,  trachea midline and thyroid normal to inspection and palpation Lungs: clear to auscultation bilaterally Breasts: normal appearance, no masses or tenderness Heart: regular rate and rhythm Abdomen: soft, non-tender; no masses,  no organomegaly Extremities: extremities normal, atraumatic, no cyanosis or edema Skin: Skin color, texture, turgor normal. No rashes or lesions Lymph nodes: Cervical, supraclavicular, and axillary nodes normal. No abnormal inguinal nodes palpated Neurologic: Grossly normal   Pelvic: External genitalia:  Some areas that look like LSA               Urethra:  normal appearing urethra with no masses, tenderness or lesions              Bartholin's and Skene's: normal                 Vagina: normal appearing vagina with normal color and discharge, no lesions              Cervix: anteverted               Pap taken: No. Bimanual Exam:  Uterus:  enlarged, 12 weeks size              Adnexa: positive for: enlargement of uterus with limited ability to feel the ovaries               Rectovaginal: Confirms               Anus:  normal sphincter tone, no lesions  Chaperone present:  yes  A:  Well Woman with normal exam  Irregular menses with menorrhagia  History of uterine fibroids  Perimenopausal female.  Weight gain.   Factor V Leiden mutation.   History of multiple DVTs. On chronic coumadin therapy.    P:   Reviewed health and wellness pertinent to exam  Pap smear as above  Mammogram is due today for apt.  Will recheck [facility] and estradiol levels  Discussed the menses bleeding and will need to consult with Dr. Collier Flowers on breast self exam, mammography screening, adequate intake of calcium and vitamin D, diet and exercise return annually or prn  An After Visit Summary was printed and given to the patient.

## 2014-06-23 NOTE — Assessment & Plan Note (Signed)
Mild intermittent, not in exacerbation. Uses advair during allergy season and albuterol prn only. No flares in the last year and no recent hospitalizations.

## 2014-06-23 NOTE — Assessment & Plan Note (Signed)
Doing well on nexium and without symptoms. Given the coumadin would continue.

## 2014-06-23 NOTE — Assessment & Plan Note (Signed)
No signs of complications except hyperlipidemia. She is working on increased exercise (zumba and walking) but her diet is not good. She has thought about seeing a nutritionist but has not gone due to reluctance. Talked with her about it today and she will think strongly about going. Talked with her about her diet and about making some small changes to help improve her health and she thinks that is realistic.

## 2014-06-23 NOTE — Assessment & Plan Note (Signed)
Will do cologuard. Up to date on immunizations and mammogram. Reviewed labs from last fall from her work and they will be repeated in the fall. Talked with her about her weight and increased risk of heart disease and she is aware of the seriousness. List of screening given to her at today's visit.

## 2014-06-23 NOTE — Patient Instructions (Signed)
Think about talking to a nutrition person as they can be a helper rather than the enemy. They may be able to help you find easy meals that you can make quickly that are healthier than eating out.   We have have the colon cancer test shipped to your house and takes about 2-3 weeks for results once they get it.   Come back next year for the check up. Work really hard on the diet and exercise as the cholesterol is really increasing your risk of getting heart problems in the future.   Health Maintenance Adopting a healthy lifestyle and getting preventive care can go a long way to promote health and wellness. Talk with your health care provider about what schedule of regular examinations is right for you. This is a good chance for you to check in with your provider about disease prevention and staying healthy. In between checkups, there are plenty of things you can do on your own. Experts have done a lot of research about which lifestyle changes and preventive measures are most likely to keep you healthy. Ask your health care provider for more information. WEIGHT AND DIET  Eat a healthy diet  Be sure to include plenty of vegetables, fruits, low-fat dairy products, and lean protein.  Do not eat a lot of foods high in solid fats, added sugars, or salt.  Get regular exercise. This is one of the most important things you can do for your health.  Most adults should exercise for at least 150 minutes each week. The exercise should increase your heart rate and make you sweat (moderate-intensity exercise).  Most adults should also do strengthening exercises at least twice a week. This is in addition to the moderate-intensity exercise.  Maintain a healthy weight  Body mass index (BMI) is a measurement that can be used to identify possible weight problems. It estimates body fat based on height and weight. Your health care provider can help determine your BMI and help you achieve or maintain a healthy  weight.  For females 92 years of age and older:   A BMI below 18.5 is considered underweight.  A BMI of 18.5 to 24.9 is normal.  A BMI of 25 to 29.9 is considered overweight.  A BMI of 30 and above is considered obese.  Watch levels of cholesterol and blood lipids  You should start having your blood tested for lipids and cholesterol at 58 years of age, then have this test every 5 years.  You may need to have your cholesterol levels checked more often if:  Your lipid or cholesterol levels are high.  You are older than 58 years of age.  You are at high risk for heart disease.  CANCER SCREENING   Lung Cancer  Lung cancer screening is recommended for adults 63-18 years old who are at high risk for lung cancer because of a history of smoking.  A yearly low-dose CT scan of the lungs is recommended for people who:  Currently smoke.  Have quit within the past 15 years.  Have at least a 30-pack-year history of smoking. A pack year is smoking an average of one pack of cigarettes a day for 1 year.  Yearly screening should continue until it has been 15 years since you quit.  Yearly screening should stop if you develop a health problem that would prevent you from having lung cancer treatment.  Breast Cancer  Practice breast self-awareness. This means understanding how your breasts normally appear and feel.  It also means doing regular breast self-exams. Let your health care provider know about any changes, no matter how small.  If you are in your 20s or 30s, you should have a clinical breast exam (CBE) by a health care provider every 1-3 years as part of a regular health exam.  If you are 39 or older, have a CBE every year. Also consider having a breast X-ray (mammogram) every year.  If you have a family history of breast cancer, talk to your health care provider about genetic screening.  If you are at high risk for breast cancer, talk to your health care provider about  having an MRI and a mammogram every year.  Breast cancer gene (BRCA) assessment is recommended for women who have family members with BRCA-related cancers. BRCA-related cancers include:  Breast.  Ovarian.  Tubal.  Peritoneal cancers.  Results of the assessment will determine the need for genetic counseling and BRCA1 and BRCA2 testing. Cervical Cancer Routine pelvic examinations to screen for cervical cancer are no longer recommended for nonpregnant women who are considered low risk for cancer of the pelvic organs (ovaries, uterus, and vagina) and who do not have symptoms. A pelvic examination may be necessary if you have symptoms including those associated with pelvic infections. Ask your health care provider if a screening pelvic exam is right for you.   The Pap test is the screening test for cervical cancer for women who are considered at risk.  If you had a hysterectomy for a problem that was not cancer or a condition that could lead to cancer, then you no longer need Pap tests.  If you are older than 65 years, and you have had normal Pap tests for the past 10 years, you no longer need to have Pap tests.  If you have had past treatment for cervical cancer or a condition that could lead to cancer, you need Pap tests and screening for cancer for at least 20 years after your treatment.  If you no longer get a Pap test, assess your risk factors if they change (such as having a new sexual partner). This can affect whether you should start being screened again.  Some women have medical problems that increase their chance of getting cervical cancer. If this is the case for you, your health care provider may recommend more frequent screening and Pap tests.  The human papillomavirus (HPV) test is another test that may be used for cervical cancer screening. The HPV test looks for the virus that can cause cell changes in the cervix. The cells collected during the Pap test can be tested for  HPV.  The HPV test can be used to screen women 62 years of age and older. Getting tested for HPV can extend the interval between normal Pap tests from three to five years.  An HPV test also should be used to screen women of any age who have unclear Pap test results.  After 58 years of age, women should have HPV testing as often as Pap tests.  Colorectal Cancer  This type of cancer can be detected and often prevented.  Routine colorectal cancer screening usually begins at 58 years of age and continues through 58 years of age.  Your health care provider may recommend screening at an earlier age if you have risk factors for colon cancer.  Your health care provider may also recommend using home test kits to check for hidden blood in the stool.  A small camera at the  end of a tube can be used to examine your colon directly (sigmoidoscopy or colonoscopy). This is done to check for the earliest forms of colorectal cancer.  Routine screening usually begins at age 42.  Direct examination of the colon should be repeated every 5-10 years through 58 years of age. However, you may need to be screened more often if early forms of precancerous polyps or small growths are found. Skin Cancer  Check your skin from head to toe regularly.  Tell your health care provider about any new moles or changes in moles, especially if there is a change in a mole's shape or color.  Also tell your health care provider if you have a mole that is larger than the size of a pencil eraser.  Always use sunscreen. Apply sunscreen liberally and repeatedly throughout the day.  Protect yourself by wearing long sleeves, pants, a wide-brimmed hat, and sunglasses whenever you are outside. HEART DISEASE, DIABETES, AND HIGH BLOOD PRESSURE   Have your blood pressure checked at least every 1-2 years. High blood pressure causes heart disease and increases the risk of stroke.  If you are between 52 years and 54 years old, ask  your health care provider if you should take aspirin to prevent strokes.  Have regular diabetes screenings. This involves taking a blood sample to check your fasting blood sugar level.  If you are at a normal weight and have a low risk for diabetes, have this test once every three years after 59 years of age.  If you are overweight and have a high risk for diabetes, consider being tested at a younger age or more often. PREVENTING INFECTION  Hepatitis B  If you have a higher risk for hepatitis B, you should be screened for this virus. You are considered at high risk for hepatitis B if:  You were born in a country where hepatitis B is common. Ask your health care provider which countries are considered high risk.  Your parents were born in a high-risk country, and you have not been immunized against hepatitis B (hepatitis B vaccine).  You have HIV or AIDS.  You use needles to inject street drugs.  You live with someone who has hepatitis B.  You have had sex with someone who has hepatitis B.  You get hemodialysis treatment.  You take certain medicines for conditions, including cancer, organ transplantation, and autoimmune conditions. Hepatitis C  Blood testing is recommended for:  Everyone born from [birth year] through 1964.  Anyone with known risk factors for hepatitis C. Sexually transmitted infections (STIs)  You should be screened for sexually transmitted infections (STIs) including gonorrhea and chlamydia if:  You are sexually active and are younger than 58 years of age.  You are older than 58 years of age and your health care provider tells you that you are at risk for this type of infection.  Your sexual activity has changed since you were last screened and you are at an increased risk for chlamydia or gonorrhea. Ask your health care provider if you are at risk.  If you do not have HIV, but are at risk, it may be recommended that you take a prescription medicine daily to  prevent HIV infection. This is called pre-exposure prophylaxis (PrEP). You are considered at risk if:  You are sexually active and do not regularly use condoms or know the HIV status of your partner(s).  You take drugs by injection.  You are sexually active with a partner who has  HIV. Talk with your health care provider about whether you are at high risk of being infected with HIV. If you choose to begin PrEP, you should first be tested for HIV. You should then be tested every 3 months for as long as you are taking PrEP.  PREGNANCY   If you are premenopausal and you may become pregnant, ask your health care provider about preconception counseling.  If you may become pregnant, take 400 to 800 micrograms (mcg) of folic acid every day.  If you want to prevent pregnancy, talk to your health care provider about birth control (contraception). OSTEOPOROSIS AND MENOPAUSE   Osteoporosis is a disease in which the bones lose minerals and strength with aging. This can result in serious bone fractures. Your risk for osteoporosis can be identified using a bone density scan.  If you are 75 years of age or older, or if you are at risk for osteoporosis and fractures, ask your health care provider if you should be screened.  Ask your health care provider whether you should take a calcium or vitamin D supplement to lower your risk for osteoporosis.  Menopause may have certain physical symptoms and risks.  Hormone replacement therapy may reduce some of these symptoms and risks. Talk to your health care provider about whether hormone replacement therapy is right for you.  HOME CARE INSTRUCTIONS   Schedule regular health, dental, and eye exams.  Stay current with your immunizations.   Do not use any tobacco products including cigarettes, chewing tobacco, or electronic cigarettes.  If you are pregnant, do not drink alcohol.  If you are breastfeeding, limit how much and how often you drink  alcohol.  Limit alcohol intake to no more than 1 drink per day for nonpregnant women. One drink equals 12 ounces of beer, 5 ounces of wine, or 1 ounces of hard liquor.  Do not use street drugs.  Do not share needles.  Ask your health care provider for help if you need support or information about quitting drugs.  Tell your health care provider if you often feel depressed.  Tell your health care provider if you have ever been abused or do not feel safe at home. Document Released: 07/29/2010 Document Revised: 05/30/2013 Document Reviewed: 12/15/2012 [facility] Patient Information 2015 [place], [place]. This information is not intended to replace advice given to you by your health care provider. Make sure you discuss any questions you have with your health care provider.

## 2014-06-23 NOTE — Patient Instructions (Addendum)
EXERCISE AND DIET:  We recommended that you start or continue a regular exercise program for good health. Regular exercise means any activity that makes your heart beat faster and makes you sweat.  We recommend exercising at least 30 minutes per day at least 3 days a week, preferably 4 or 5.  We also recommend a diet low in fat and sugar.  Inactivity, poor dietary choices and obesity can cause diabetes, heart attack, stroke, and kidney damage, among others.    ALCOHOL AND SMOKING:  Women should limit their alcohol intake to no more than 7 drinks/beers/glasses of wine (combined, not each!) per week. Moderation of alcohol intake to this level decreases your risk of breast cancer and liver damage. And of course, no recreational drugs are part of a healthy lifestyle.  And absolutely no smoking or even second hand smoke. Most people know smoking can cause heart and lung diseases, but did you know it also contributes to weakening of your bones? Aging of your skin?  Yellowing of your teeth and nails?  CALCIUM AND VITAMIN D:  Adequate intake of calcium and Vitamin D are recommended.  The recommendations for exact amounts of these supplements seem to change often, but generally speaking 600 mg of calcium (either carbonate or citrate) and 800 units of Vitamin D per day seems prudent. Certain women may benefit from higher intake of Vitamin D.  If you are among these women, your doctor will have told you during your visit.    PAP SMEARS:  Pap smears, to check for cervical cancer or precancers,  have traditionally been done yearly, although recent scientific advances have shown that most women can have pap smears less often.  However, every woman still should have a physical exam from her gynecologist every year. It will include a breast check, inspection of the vulva and vagina to check for abnormal growths or skin changes, a visual exam of the cervix, and then an exam to evaluate the size and shape of the uterus and  ovaries.  And after 58 years of age, a rectal exam is indicated to check for rectal cancers. We will also provide age appropriate advice regarding health maintenance, like when you should have certain vaccines, screening for sexually transmitted diseases, bone density testing, colonoscopy, mammograms, etc.   MAMMOGRAMS:  All women over 40 years old should have a yearly mammogram. Many facilities now offer a "3D" mammogram, which may cost around $50 extra out of pocket. If possible,  we recommend you accept the option to have the 3D mammogram performed.  It both reduces the number of women who will be called back for extra views which then turn out to be normal, and it is better than the routine mammogram at detecting truly abnormal areas.    COLONOSCOPY:  Colonoscopy to screen for colon cancer is recommended for all women at age 50.  We know, you hate the idea of the prep.  We agree, BUT, having colon cancer and not knowing it is worse!!  Colon cancer so often starts as a polyp that can be seen and removed at colonscopy, which can quite literally save your life!  And if your first colonoscopy is normal and you have no family history of colon cancer, most women don't have to have it again for 10 years.  Once every ten years, you can do something that may end up saving your life, right?  We will be happy to help you get it scheduled when you are ready.    Be sure to check your insurance coverage so you understand how much it will cost.  It may be covered as a preventative service at no cost, but you should check your particular policy.      Lichen Sclerosus Lichen sclerosus is a skin problem. It can happen on any part of the body, but it commonly involves the anal or genital areas. Lichen sclerosus is not an infection or a fungus. Girls and women are more commonly affected than boys and men. CAUSES The cause is not known. It could be the result of an overactive immune system or a lack of certain hormones.  Lichen sclerosus is not passed from one person to another (not contagious). SYMPTOMS Your skin may have:  Thin, wrinkled, white areas.  Thickened white areas.  Red and swollen patches.  Tears or cracks.  Bruising.  Blood blisters.  Severe itching. You may also have pain, itching, or burning with urination. Constipation is also common in people with lichen sclerosus. DIAGNOSIS Your caregiver will do a physical exam. Sometimes, a tissue sample (biopsy) may be sent for testing. TREATMENT Treatment may involve putting a thin layer of medicated cream (topical steroid) over the areas with lichen sclerosus. Use the cream only as directed by your caregiver.  HOME CARE INSTRUCTIONS  Only take over-the-counter or prescription medicines as directed by your caregiver.  Keep the vaginal area as clean and dry as possible. SEEK MEDICAL CARE IF: You develop increasing pain, swelling, or redness. Document Released: 06/05/2010 Document Revised: 04/07/2011 Document Reviewed: 06/05/2010 [facility] Patient Information 2015 [place], [place]. This information is not intended to replace advice given to you by your health care provider. Make sure you discuss any questions you have with your health care provider.

## 2014-06-23 NOTE — Progress Notes (Signed)
   Subjective:    Patient ID: Megan Parrish, female    DOB: [birth year], 59 y.o.   MRN: [id number]  HPI The patient is here for wellness. No new complaints. Please see A/P for status and treatment of chronic medical problems. Weight is increased from last year.   PMH, [facility], social history reviewed and updated with the patient.   Review of Systems  Constitutional: Positive for activity change. Negative for fever, appetite change, fatigue and unexpected weight change.  HENT: Negative.   Eyes: Negative.   Respiratory: Negative for cough, chest tightness, shortness of breath and wheezing.   Cardiovascular: Negative for chest pain, palpitations and leg swelling.  Gastrointestinal: Negative for nausea, abdominal pain, diarrhea, constipation and abdominal distention.  Musculoskeletal: Negative.   Skin: Negative.   Neurological: Negative.   Psychiatric/Behavioral: Negative.       Objective:   Physical Exam  Constitutional: She is oriented to person, place, and time. She appears well-developed and well-nourished.  Overweight  HENT:  Head: Normocephalic and atraumatic.  Eyes: EOM are normal.  Neck: Normal range of motion.  Cardiovascular: Normal rate and regular rhythm.   Carotids without bruit bilaterally  Pulmonary/Chest: Effort normal and breath sounds normal. No respiratory distress. She has no wheezes. She has no rales.  Abdominal: Soft. Bowel sounds are normal. She exhibits no distension. There is no tenderness. There is no rebound.  Musculoskeletal: She exhibits no edema.  Neurological: She is alert and oriented to person, place, and time. Coordination normal.  Skin: Skin is warm and dry.  Psychiatric: She has a normal mood and affect.   Filed Vitals:   06/23/14 0828  BP: 114/64  Pulse: 81  Temp: 98.2 F (36.8 C)  TempSrc: Oral  Resp: 16  Height: 5\' 9"  (1.753 m)  Weight: 245 lb (111.131 kg)  SpO2: 96%      Assessment & Plan:

## 2014-06-23 NOTE — Assessment & Plan Note (Signed)
Target LDL <130 and last 185. Talked to her about medical therapy and she does not wish to pursue. Talked to her about her increased risk of cardiac disease (especially with family history) and she will work on changing her diet (lots of fast foods now). She is exercising more.

## 2014-06-23 NOTE — Assessment & Plan Note (Signed)
Doing well on coumadin and still goes to coumadin clinic. Given her multiple recurrences is on lifelong therapy with coumadin or NOAC.

## 2014-06-24 LAB — ESTRADIOL: Estradiol: 15.4 pg/mL

## 2014-06-24 LAB — FOLLICLE STIMULATING HORMONE: FSH: 47.1 m[IU]/mL

## 2014-06-25 NOTE — Progress Notes (Signed)
Encounter reviewed by Dr. Josefa Half.  I recommend proceeding with a pelvic ultrasound and sonohysterogram.   Any future EMB and vulvar biopsy can be coordinated with a plan for discontinuation of her coumadin first.

## 2014-06-27 ENCOUNTER — Other Ambulatory Visit: Payer: Self-pay | Admitting: General Practice

## 2014-06-27 ENCOUNTER — Telehealth: Payer: Self-pay | Admitting: Emergency Medicine

## 2014-06-27 ENCOUNTER — Ambulatory Visit (INDEPENDENT_AMBULATORY_CARE_PROVIDER_SITE_OTHER): Payer: Managed Care, Other (non HMO) | Admitting: General Practice

## 2014-06-27 DIAGNOSIS — N921 Excessive and frequent menstruation with irregular cycle: Secondary | ICD-10-CM

## 2014-06-27 DIAGNOSIS — I82409 Acute embolism and thrombosis of unspecified deep veins of unspecified lower extremity: Secondary | ICD-10-CM | POA: Diagnosis not present

## 2014-06-27 LAB — POCT INR: INR: 2.5

## 2014-06-27 MED ORDER — WARFARIN SODIUM 5 MG PO TABS: ORAL_TABLET | ORAL | Status: DC

## 2014-06-27 NOTE — Progress Notes (Signed)
Pre visit review using our clinic review tool, if applicable. No additional management support is needed unless otherwise documented below in the visit note. 

## 2014-06-27 NOTE — Telephone Encounter (Signed)
-----   Message from Kem Boroughs, [place] sent at 06/27/2014  8:16 AM EDT ----- See note about  Schedule for PUS/ SHGM and EMB.  Will need coumadin adjustment. ----- Message -----    From: Nunzio Cobbs, MD    Sent: 06/27/2014   7:22 AM      To: Kem Boroughs, FNP  I recommend proceeding with a pelvic ultrasound and sonohysterogram.   Any future EMB and vulvar biopsy can be coordinated with a plan for discontinuation of her coumadin first.

## 2014-06-27 NOTE — Telephone Encounter (Signed)
Dr. Quincy Simmonds,  Can you review and advise plan regarding plan.  Schedule patient at this time for Sonohysterogram only?

## 2014-06-27 NOTE — Progress Notes (Signed)
I have reviewed and agree with the plan. 

## 2014-06-28 NOTE — Telephone Encounter (Signed)
My though process is to proceed with pelvic ultrasound and sonohysterogram first. Preferably tomorrow if there is an opening.  I would like to evaluate the areas on her vulva at that time.  If she needs an endometrial biopsy and a vulvar biopsy, I would do these at the same time, and then stop her coumadin in preparation for this.  I do not want to stop the coumadin unnecessarily.

## 2014-06-28 NOTE — Telephone Encounter (Signed)
Order placed, routing to [employer] for  insurance pre certification.

## 2014-06-29 NOTE — Telephone Encounter (Signed)
Spoke with patient. Advised of benefit quote received for PUS/SHGM. Patient will call back to schedule.

## 2014-06-29 NOTE — Telephone Encounter (Signed)
Pre-cert complete

## 2014-07-05 NOTE — Telephone Encounter (Signed)
Patient returned call. She is advised of message with Dr. Quincy Simmonds. Advised of lab results showing Grannis of 47.1 and Estradiol level of 15.4. Dicussed importance of evaluation with Pelvic ultrasound and continued vaginal bleeding. Patient with history of fibroids. Advised Pelvic ultrasound necessary to assess Uterus, ovaries, endometrial lining, and size of fibroids and polyps or other reasons for bleeding. Advised may order additional testing after ultrasound, possible biopsies but would plan those together if necessary per Dr. Quincy Simmonds. Patient is agreeable to ultrasound and is scheduled with Dr. Quincy Simmonds for 07/20/14. She is notified of ultrasound cancellation policy and agreeable.   Routing to provider for final review. Patient agreeable to disposition. Will close encounter.   cc Milford Cage, FNP,  cc Felipa Emory

## 2014-07-05 NOTE — Telephone Encounter (Signed)
Reviewed with Dr. Quincy Simmonds, okay to order Pelvic ultrasound only at this time and discuss with patient concern about need for ultrasound due to bleeding and evaluate for any presence of abnormal cells or precancerous changes of the uterine lining, infection, polyps or fibroids  Order placed for Pelvic ultrasound.   Message left to return call to [place] at [phone].

## 2014-07-20 ENCOUNTER — Encounter: Payer: Self-pay | Admitting: Obstetrics and Gynecology

## 2014-07-20 ENCOUNTER — Ambulatory Visit (INDEPENDENT_AMBULATORY_CARE_PROVIDER_SITE_OTHER): Payer: Managed Care, Other (non HMO) | Admitting: Obstetrics and Gynecology

## 2014-07-20 ENCOUNTER — Ambulatory Visit (INDEPENDENT_AMBULATORY_CARE_PROVIDER_SITE_OTHER): Payer: Managed Care, Other (non HMO)

## 2014-07-20 VITALS — BP 116/78 | Resp 18 | Ht 67.5 in | Wt 245.0 lb

## 2014-07-20 DIAGNOSIS — N7689 Other specified inflammation of vagina and vulva: Secondary | ICD-10-CM

## 2014-07-20 DIAGNOSIS — L309 Dermatitis, unspecified: Secondary | ICD-10-CM

## 2014-07-20 DIAGNOSIS — N951 Menopausal and female climacteric states: Secondary | ICD-10-CM | POA: Diagnosis not present

## 2014-07-20 DIAGNOSIS — D259 Leiomyoma of uterus, unspecified: Secondary | ICD-10-CM | POA: Diagnosis not present

## 2014-07-20 DIAGNOSIS — N921 Excessive and frequent menstruation with irregular cycle: Secondary | ICD-10-CM | POA: Diagnosis not present

## 2014-07-20 NOTE — Patient Instructions (Signed)
Please call for any future vaginal bleeding.   Use your triamcinolone cream on the vulva twice a day for the next month.  Switch to dye free and perfume free pads.

## 2014-07-20 NOTE — Progress Notes (Signed)
Subjective  59 y.o. G0P0  Caucasianfemale here for pelvic ultrasound for evaluation of fibroids and check of vulvar area of potential lichen sclerosus.  No menses since April.  Not even spotting.  [place] on 06/23/14 - 47.1 and E2 15.4. Having hot flashes.   Hx of DVT.  Has factor V Leiden deficiency.  On Coumadin.   Has occasional soreness of the vulva.  Wears a pad constantly due to urinary leakage. Doing Kegel's so leakage is less.   Objective  Pelvic ultrasound images and report reviewed with patient.  Uterus - 4 fibroids - 1.1 - 7.7 cm.  Submucosal fibroid 3.4 cm EMS - 5.71 Ovaries - normal right ovary.  Left difficult to see. Free fluid - no       Pelvic exam - External genitalia - scattered areas of hypopigmentation. No ulceration or fissures. No raised lesions.  Outline of sanitary pad noted on skin.  Assessment  Asymptomatic fibroids.  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  On Coumadin for Factor V Leiden. Menopausal symptoms.   Plan   Discussion of fibroids. No EMB needed. No vulvar biopsy needed.  Will have patient use triamcinolone cream on vulva bid (She already has this Rx.) Switch to hypoallergenic pads. Not estrogen candidate. SSRI or SNRI if needs relief of hot flashes. Follow up in one month.   __25_____ minutes face to face time of which over 50% was spent in counseling.   After visit summary to patient.

## 2014-08-01 ENCOUNTER — Ambulatory Visit (INDEPENDENT_AMBULATORY_CARE_PROVIDER_SITE_OTHER): Payer: Managed Care, Other (non HMO) | Admitting: General Practice

## 2014-08-01 DIAGNOSIS — I82409 Acute embolism and thrombosis of unspecified deep veins of unspecified lower extremity: Secondary | ICD-10-CM | POA: Diagnosis not present

## 2014-08-01 LAB — POCT INR: INR: 2.6

## 2014-08-01 NOTE — Progress Notes (Signed)
I have reviewed and agree with the plan. 

## 2014-08-01 NOTE — Progress Notes (Signed)
Pre visit review using our clinic review tool, if applicable. No additional management support is needed unless otherwise documented below in the visit note. 

## 2014-08-18 ENCOUNTER — Telehealth: Payer: Self-pay | Admitting: Obstetrics and Gynecology

## 2014-08-18 NOTE — Telephone Encounter (Signed)
Patient canceled her appointment 08/23/14 for recheck with Dr.Silva.  Patient did not wish to reschedule.

## 2014-08-18 NOTE — Telephone Encounter (Signed)
OK.  Encounter closed.

## 2014-08-23 ENCOUNTER — Ambulatory Visit: Payer: Managed Care, Other (non HMO) | Admitting: Obstetrics and Gynecology

## 2014-09-12 ENCOUNTER — Ambulatory Visit (INDEPENDENT_AMBULATORY_CARE_PROVIDER_SITE_OTHER): Payer: Managed Care, Other (non HMO) | Admitting: General Practice

## 2014-09-12 DIAGNOSIS — I82409 Acute embolism and thrombosis of unspecified deep veins of unspecified lower extremity: Secondary | ICD-10-CM | POA: Diagnosis not present

## 2014-09-12 LAB — POCT INR: INR: 1.9

## 2014-09-12 NOTE — Progress Notes (Signed)
Pre visit review using our clinic review tool, if applicable. No additional management support is needed unless otherwise documented below in the visit note. 

## 2014-09-12 NOTE — Progress Notes (Signed)
I have reviewed and agree with the plan. 

## 2014-10-31 ENCOUNTER — Ambulatory Visit (INDEPENDENT_AMBULATORY_CARE_PROVIDER_SITE_OTHER): Payer: Managed Care, Other (non HMO) | Admitting: General Practice

## 2014-10-31 DIAGNOSIS — I82409 Acute embolism and thrombosis of unspecified deep veins of unspecified lower extremity: Secondary | ICD-10-CM

## 2014-10-31 LAB — POCT INR: INR: 2

## 2014-10-31 NOTE — Progress Notes (Signed)
Pre visit review using our clinic review tool, if applicable. No additional management support is needed unless otherwise documented below in the visit note. 

## 2014-11-01 NOTE — Progress Notes (Signed)
I have reviewed and agree with the plan. 

## 2014-11-09 ENCOUNTER — Telehealth: Payer: Self-pay | Admitting: Oncology

## 2014-11-09 ENCOUNTER — Telehealth: Payer: Self-pay | Admitting: *Deleted

## 2014-11-09 ENCOUNTER — Other Ambulatory Visit: Payer: Self-pay | Admitting: *Deleted

## 2014-11-09 NOTE — Telephone Encounter (Signed)
Received voice message from patient stating,"I see Dr. Alen Blew for blood clots, and only yearly appointments, is it OK with him if I see him every other year for blood clots?" Return number is [phone].

## 2014-11-09 NOTE — Telephone Encounter (Signed)
PAL - moved 10/27 f/u to 11/29. Spoke with patient she is aware and would like to see FS every other year instead of every year. Patient forwarded to desk nurse.

## 2014-11-09 NOTE — Telephone Encounter (Signed)
That is fine by me.

## 2014-11-10 ENCOUNTER — Telehealth: Payer: Self-pay | Admitting: Oncology

## 2014-11-10 NOTE — Telephone Encounter (Signed)
Moved NOV appt to oCT 2017 per pof..per orders pof pt aware

## 2014-11-23 ENCOUNTER — Ambulatory Visit: Payer: Private Health Insurance - Indemnity | Admitting: Oncology

## 2014-12-12 ENCOUNTER — Ambulatory Visit (INDEPENDENT_AMBULATORY_CARE_PROVIDER_SITE_OTHER): Payer: Managed Care, Other (non HMO) | Admitting: General Practice

## 2014-12-12 DIAGNOSIS — I82409 Acute embolism and thrombosis of unspecified deep veins of unspecified lower extremity: Secondary | ICD-10-CM | POA: Diagnosis not present

## 2014-12-12 LAB — POCT INR: INR: 2.1

## 2014-12-12 NOTE — Progress Notes (Signed)
Pre visit review using our clinic review tool, if applicable. No additional management support is needed unless otherwise documented below in the visit note. 

## 2014-12-12 NOTE — Progress Notes (Signed)
I have reviewed and agree with the plan. 

## 2014-12-26 ENCOUNTER — Ambulatory Visit: Payer: Managed Care, Other (non HMO) | Admitting: Oncology

## 2015-01-23 ENCOUNTER — Ambulatory Visit (INDEPENDENT_AMBULATORY_CARE_PROVIDER_SITE_OTHER): Payer: Managed Care, Other (non HMO) | Admitting: General Practice

## 2015-01-23 DIAGNOSIS — I82409 Acute embolism and thrombosis of unspecified deep veins of unspecified lower extremity: Secondary | ICD-10-CM

## 2015-01-23 LAB — POCT INR: INR: 1.8

## 2015-01-23 NOTE — Progress Notes (Signed)
Have read and agree with the above plan.  Elizabeth A Crawford, MD  

## 2015-01-23 NOTE — Progress Notes (Signed)
Pre visit review using our clinic review tool, if applicable. No additional management support is needed unless otherwise documented below in the visit note. 

## 2015-01-28 DIAGNOSIS — M722 Plantar fascial fibromatosis: Secondary | ICD-10-CM

## 2015-01-28 HISTORY — DX: Plantar fascial fibromatosis: M72.2

## 2015-03-09 ENCOUNTER — Ambulatory Visit (INDEPENDENT_AMBULATORY_CARE_PROVIDER_SITE_OTHER): Payer: Managed Care, Other (non HMO) | Admitting: General Practice

## 2015-03-09 DIAGNOSIS — Z5181 Encounter for therapeutic drug level monitoring: Secondary | ICD-10-CM | POA: Diagnosis not present

## 2015-03-09 DIAGNOSIS — I82409 Acute embolism and thrombosis of unspecified deep veins of unspecified lower extremity: Secondary | ICD-10-CM | POA: Diagnosis not present

## 2015-03-09 LAB — POCT INR: INR: 1.9

## 2015-03-09 NOTE — Progress Notes (Signed)
I have reviewed and agree with the plan. 

## 2015-03-09 NOTE — Progress Notes (Signed)
Pre visit review using our clinic review tool, if applicable. No additional management support is needed unless otherwise documented below in the visit note. 

## 2015-04-20 ENCOUNTER — Ambulatory Visit (INDEPENDENT_AMBULATORY_CARE_PROVIDER_SITE_OTHER): Payer: Managed Care, Other (non HMO) | Admitting: General Practice

## 2015-04-20 DIAGNOSIS — I82409 Acute embolism and thrombosis of unspecified deep veins of unspecified lower extremity: Secondary | ICD-10-CM

## 2015-04-20 LAB — POCT INR: INR: 2.4

## 2015-04-20 NOTE — Progress Notes (Signed)
Pre visit review using our clinic review tool, if applicable. No additional management support is needed unless otherwise documented below in the visit note. 

## 2015-04-21 NOTE — Progress Notes (Signed)
I have reviewed and agree with the plan. 

## 2015-06-01 ENCOUNTER — Ambulatory Visit (INDEPENDENT_AMBULATORY_CARE_PROVIDER_SITE_OTHER): Payer: Managed Care, Other (non HMO) | Admitting: General Practice

## 2015-06-01 DIAGNOSIS — I82409 Acute embolism and thrombosis of unspecified deep veins of unspecified lower extremity: Secondary | ICD-10-CM

## 2015-06-01 LAB — POCT INR: INR: 2.4

## 2015-06-01 NOTE — Progress Notes (Signed)
Pre visit review using our clinic review tool, if applicable. No additional management support is needed unless otherwise documented below in the visit note. 

## 2015-06-28 LAB — COLOGUARD

## 2015-07-03 ENCOUNTER — Ambulatory Visit (INDEPENDENT_AMBULATORY_CARE_PROVIDER_SITE_OTHER): Payer: Managed Care, Other (non HMO) | Admitting: Nurse Practitioner

## 2015-07-03 ENCOUNTER — Encounter: Payer: Self-pay | Admitting: Nurse Practitioner

## 2015-07-03 VITALS — BP 118/80 | HR 68 | Resp 16 | Ht 67.5 in | Wt 223.8 lb

## 2015-07-03 DIAGNOSIS — Z0000 Encounter for general adult medical examination without abnormal findings: Secondary | ICD-10-CM | POA: Diagnosis not present

## 2015-07-03 DIAGNOSIS — N926 Irregular menstruation, unspecified: Secondary | ICD-10-CM

## 2015-07-03 DIAGNOSIS — I82409 Acute embolism and thrombosis of unspecified deep veins of unspecified lower extremity: Secondary | ICD-10-CM

## 2015-07-03 DIAGNOSIS — Z01419 Encounter for gynecological examination (general) (routine) without abnormal findings: Secondary | ICD-10-CM | POA: Diagnosis not present

## 2015-07-03 DIAGNOSIS — D259 Leiomyoma of uterus, unspecified: Secondary | ICD-10-CM | POA: Diagnosis not present

## 2015-07-03 LAB — COMPREHENSIVE METABOLIC PANEL
ALBUMIN: 4.1 g/dL (ref 3.6–5.1)
ALK PHOS: 79 U/L (ref 33–130)
ALT: 16 U/L (ref 6–29)
AST: 19 U/L (ref 10–35)
BILIRUBIN TOTAL: 0.6 mg/dL (ref 0.2–1.2)
BUN: 17 mg/dL (ref 7–25)
CALCIUM: 9.6 mg/dL (ref 8.6–10.4)
CO2: 27 mmol/L (ref 20–31)
Chloride: 103 mmol/L (ref 98–110)
Creat: 0.79 mg/dL (ref 0.50–1.05)
GLUCOSE: 90 mg/dL (ref 65–99)
POTASSIUM: 4.3 mmol/L (ref 3.5–5.3)
Sodium: 142 mmol/L (ref 135–146)
Total Protein: 6.4 g/dL (ref 6.1–8.1)

## 2015-07-03 NOTE — Patient Instructions (Signed)

## 2015-07-03 NOTE — Progress Notes (Signed)
60 y.o. G0P0 Single  Caucasian Fe here for annual exam.  Menses more irregular at every 2 months.  Lasting 2-3 days, now light to spotting.  In the past year only 1 cycle that was heavier.  PUS done 07/20/14 with uterine fibroids. LSA with occasional flares.  Same partner for 34 yrs.  Wt 244 lb last May.   02/2015 wt was @  252; now at 223 lbs with [employer].  Patient's last menstrual period was 04/28/2015 (approximate).          Sexually active: No.  The current method of family planning is abstinence.    Exercising: Yes.    Walking, Zumba Smoker:  no  Health Maintenance: Pap:  04/11/13-WNL neg HR HPV MMG:  06/23/14-BIRADS1-neg Colonoscopy: Never- declines and does IFOB with PCP TDaP:  2010 Hep C and HIV: done today Labs: done today   reports that she quit smoking about 7 years ago. Her smoking use included Cigarettes. She has a 30 pack-year smoking history. She has never used smokeless tobacco. She reports that she drinks alcohol. She reports that she does not use illicit drugs.  Past Medical History  Diagnosis Date  . 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 Palpitations   . THYROID NODULE 08/2008  . GERD   . ASTHMA   . Anxiety state, unspecified   . ALLERGIC RHINITIS   . Factor V deficiency (HCC)     chronic anticoag   . Fibroid     Past Surgical History  Procedure Laterality Date  . No past surgeries    . Perianal cyst removal  2001    Current Outpatient Prescriptions  Medication Sig Dispense Refill  . albuterol (PROAIR HFA) 108 (90 BASE) MCG/ACT inhaler Inhale 2 puffs into the lungs every 4 (four) hours as needed. 18 g 3  . esomeprazole (NEXIUM) 40 MG capsule Take 40 mg by mouth daily at 12 noon.    . Fluticasone-Salmeterol (ADVAIR) 100-50 MCG/DOSE AEPB Inhale 1 puff into the lungs as needed.    . triamcinolone cream (KENALOG) 0.1 % Apply 1 application topically 2 (two) times daily. 80 g 2  . warfarin (COUMADIN) 5 MG tablet Take  as directed by Anticoagulation clinic 144 tablet 3   No current facility-administered medications for this visit.    Family History  Problem Relation Age of Onset  . Arthritis Other     Parent  . Uterine cancer Other     grandparent  . Dementia Father   . Hypertension Father   . Hypertension Mother   . Thyroid disease Mother     parathyroid cyst removed  . Ovarian cancer Maternal Grandmother     ROS:  Pertinent items are noted in HPI.  Otherwise, a comprehensive ROS was negative.  Exam:   BP 118/80 mmHg  Pulse 68  Resp 16  Ht 5' 7.5" (1.715 m)  Wt 223 lb 12.8 oz (101.515 kg)  BMI 34.51 kg/m2  LMP 04/28/2015 (Approximate) Height: 5' 7.5" (171.5 cm) Ht Readings from Last 3 Encounters:  07/03/15 5' 7.5" (1.715 m)  07/20/14 5' 7.5" (1.715 m)  06/23/14 5\' 9"  (1.753 m)    General appearance: alert, cooperative and appears stated age Head: Normocephalic, without obvious abnormality, atraumatic Neck: no adenopathy, supple, symmetrical, trachea midline and thyroid normal to inspection and palpation Lungs: clear to auscultation bilaterally Breasts: normal appearance, no masses or tenderness Heart: regular rate and rhythm Abdomen: soft, non-tender;  no masses,  no organomegaly Extremities: extremities normal, atraumatic, no cyanosis or edema Skin: Skin color, texture, turgor normal. No rashes or lesions Lymph nodes: Cervical, supraclavicular, and axillary nodes normal. No abnormal inguinal nodes palpated Neurologic: Grossly normal   Pelvic: External genitalia:  no lesions              Urethra:  normal appearing urethra with no masses, tenderness or lesions              Bartholin's and Skene's: normal                 Vagina: normal appearing vagina with normal color and discharge, no lesions              Cervix: anteverted              Pap taken: No. Bimanual Exam:  Uterus:  enlarged, 6 weeks size              Adnexa: no mass, fullness, tenderness                Rectovaginal: Confirms               Anus:  normal sphincter tone, no lesions  Chaperone present: no  A:  Well Woman with normal exam Irregular menses with menorrhagia History of uterine fibroids Perimenopausal female. Weight gain.  Factor V Leiden mutation.  History of multiple DVT's. On chronic coumadin therapy.     P:   Reviewed health and wellness pertinent to exam  Pap smear as above  Mammogram is due now and will schedule  Will follow with labs  If no menses in 4 months to call back  Counseled on breast self exam, mammography screening, adequate intake of calcium and vitamin D, diet and exercise, Kegel's exercises return annually or prn  An After Visit Summary was printed and given to the patient.

## 2015-07-04 ENCOUNTER — Telehealth: Payer: Self-pay | Admitting: *Deleted

## 2015-07-04 ENCOUNTER — Other Ambulatory Visit: Payer: Self-pay | Admitting: *Deleted

## 2015-07-04 DIAGNOSIS — E559 Vitamin D deficiency, unspecified: Secondary | ICD-10-CM

## 2015-07-04 LAB — TSH: TSH: 1.01 m[IU]/L

## 2015-07-04 LAB — VITAMIN D 25 HYDROXY (VIT D DEFICIENCY, FRACTURES): Vit D, 25-Hydroxy: 18 ng/mL — ABNORMAL LOW (ref 30–100)

## 2015-07-04 LAB — HEPATITIS C ANTIBODY: HCV Ab: NEGATIVE

## 2015-07-04 LAB — HIV ANTIBODY (ROUTINE TESTING W REFLEX): HIV: NONREACTIVE

## 2015-07-04 MED ORDER — VITAMIN D (ERGOCALCIFEROL) 1.25 MG (50000 UNIT) PO CAPS: 50000.0000 [IU] | ORAL_CAPSULE | ORAL | Status: DC

## 2015-07-04 NOTE — Telephone Encounter (Signed)
   Notes Recorded by Elroy Channel, CMA on 07/04/2015 at 3:05 PM Pt notified. Verbalized understanding. Lab appt scheduled 10/22/15 @8 :15am. Rx sent to pharmacy.

## 2015-07-04 NOTE — Progress Notes (Signed)
Encounter reviewed Jill Jertson, MD   

## 2015-07-04 NOTE — Telephone Encounter (Signed)
Left voice mail to call back 

## 2015-07-04 NOTE — Telephone Encounter (Signed)
-----   Message from Kem Boroughs, [place] sent at 07/04/2015  8:34 AM EDT ----- Please let pt know that HIV and Hep C is negative as expected.  The Vit D is quite low at 18 (goal for her is about 50).  She needs to be on RX Vit D weekly X 3 months per protocol.  The thyroid and CMP including glucose was normal.

## 2015-07-10 ENCOUNTER — Encounter: Payer: Self-pay | Admitting: Internal Medicine

## 2015-07-13 ENCOUNTER — Ambulatory Visit (INDEPENDENT_AMBULATORY_CARE_PROVIDER_SITE_OTHER): Payer: Managed Care, Other (non HMO) | Admitting: General Practice

## 2015-07-13 DIAGNOSIS — I82409 Acute embolism and thrombosis of unspecified deep veins of unspecified lower extremity: Secondary | ICD-10-CM

## 2015-07-13 LAB — POCT INR: INR: 2.3

## 2015-07-13 NOTE — Progress Notes (Signed)
I have reviewed and agree with the plan. 

## 2015-07-13 NOTE — Progress Notes (Signed)
Pre visit review using our clinic review tool, if applicable. No additional management support is needed unless otherwise documented below in the visit note. 

## 2015-08-23 ENCOUNTER — Other Ambulatory Visit: Payer: Self-pay | Admitting: Internal Medicine

## 2015-08-23 ENCOUNTER — Other Ambulatory Visit: Payer: Self-pay | Admitting: General Practice

## 2015-08-23 MED ORDER — WARFARIN SODIUM 5 MG PO TABS: ORAL_TABLET | ORAL | 0 refills | Status: DC

## 2015-08-24 ENCOUNTER — Ambulatory Visit (INDEPENDENT_AMBULATORY_CARE_PROVIDER_SITE_OTHER): Payer: Managed Care, Other (non HMO) | Admitting: General Practice

## 2015-08-24 ENCOUNTER — Telehealth: Payer: Self-pay | Admitting: Emergency Medicine

## 2015-08-24 DIAGNOSIS — I82409 Acute embolism and thrombosis of unspecified deep veins of unspecified lower extremity: Secondary | ICD-10-CM | POA: Diagnosis not present

## 2015-08-24 LAB — POCT INR: INR: 2.3

## 2015-08-24 NOTE — Telephone Encounter (Signed)
Pt needs a refill on warfarin (COUMADIN) 5 MG tablet. Pharmacy is [facility] order drug. She has an appointment 09/18/15 at 2:30 pm. Please follow up thanks.

## 2015-08-24 NOTE — Progress Notes (Signed)
I have reviewed and agree with the plan. 

## 2015-09-18 ENCOUNTER — Ambulatory Visit (INDEPENDENT_AMBULATORY_CARE_PROVIDER_SITE_OTHER): Payer: Managed Care, Other (non HMO) | Admitting: Internal Medicine

## 2015-09-18 ENCOUNTER — Encounter: Payer: Self-pay | Admitting: Internal Medicine

## 2015-09-18 DIAGNOSIS — Z0000 Encounter for general adult medical examination without abnormal findings: Secondary | ICD-10-CM

## 2015-09-18 DIAGNOSIS — E669 Obesity, unspecified: Secondary | ICD-10-CM | POA: Diagnosis not present

## 2015-09-18 DIAGNOSIS — E785 Hyperlipidemia, unspecified: Secondary | ICD-10-CM

## 2015-09-18 DIAGNOSIS — J4520 Mild intermittent asthma, uncomplicated: Secondary | ICD-10-CM

## 2015-09-18 MED ORDER — ALBUTEROL SULFATE HFA 108 (90 BASE) MCG/ACT IN AERS: 2.0000 | INHALATION_SPRAY | RESPIRATORY_TRACT | 3 refills | Status: DC | PRN

## 2015-09-18 NOTE — Progress Notes (Signed)
   Subjective:    Patient ID: Megan Parrish, female    DOB: 1955/02/15, 60 y.o.   MRN: [id number]  HPI The patient is a 60 YO female coming in for wellness. No new concerns or problems. Weight is down about 20 pounds since last year. Got labs at her job for the cholesterol (LDL 160 down from 190 last year).   PMH, [facility], social history reviewed and updated.   Review of Systems  Constitutional: Negative for activity change, appetite change, fatigue, fever and unexpected weight change.  HENT: Negative.   Eyes: Negative.   Respiratory: Negative for cough, chest tightness, shortness of breath and wheezing.   Cardiovascular: Negative for chest pain, palpitations and leg swelling.  Gastrointestinal: Negative for abdominal distention, abdominal pain, constipation, diarrhea and nausea.  Musculoskeletal: Negative.   Skin: Negative.   Neurological: Negative.   Psychiatric/Behavioral: Negative.       Objective:   Physical Exam  Constitutional: She is oriented to person, place, and time. She appears well-developed and well-nourished.  Overweight  HENT:  Head: Normocephalic and atraumatic.  Eyes: EOM are normal.  Neck: Normal range of motion.  Cardiovascular: Normal rate and regular rhythm.   Carotids without bruit bilaterally  Pulmonary/Chest: Effort normal and breath sounds normal. No respiratory distress. She has no wheezes. She has no rales.  Abdominal: Soft. Bowel sounds are normal. She exhibits no distension. There is no tenderness. There is no rebound.  Musculoskeletal: She exhibits no edema.  Neurological: She is alert and oriented to person, place, and time. Coordination normal.  Skin: Skin is warm and dry.  Psychiatric: She has a normal mood and affect.   Vitals:   09/18/15 1436  BP: 110/78  Pulse: 76  Resp: 14  Temp: 98.3 F (36.8 C)  TempSrc: Oral  SpO2: 98%  Weight: 228 lb (103.4 kg)  Height: 5\' 9"  (1.753 m)      Assessment & Plan:

## 2015-09-18 NOTE — Patient Instructions (Signed)
We have refilled the albuterol and you can call us if you need the advair to be refilled.   Keep up the good work with exercise and the diet to work on the weight for the cholesterol.   Health Maintenance, Female Adopting a healthy lifestyle and getting preventive care can go a long way to promote health and wellness. Talk with your health care provider about what schedule of regular examinations is right for you. This is a good chance for you to check in with your provider about disease prevention and staying healthy. In between checkups, there are plenty of things you can do on your own. Experts have done a lot of research about which lifestyle changes and preventive measures are most likely to keep you healthy. Ask your health care provider for more information. WEIGHT AND DIET  Eat a healthy diet  Be sure to include plenty of vegetables, fruits, low-fat dairy products, and lean protein.  Do not eat a lot of foods high in solid fats, added sugars, or salt.  Get regular exercise. This is one of the most important things you can do for your health.  Most adults should exercise for at least 150 minutes each week. The exercise should increase your heart rate and make you sweat (moderate-intensity exercise).  Most adults should also do strengthening exercises at least twice a week. This is in addition to the moderate-intensity exercise.  Maintain a healthy weight  Body mass index (BMI) is a measurement that can be used to identify possible weight problems. It estimates body fat based on height and weight. Your health care provider can help determine your BMI and help you achieve or maintain a healthy weight.  For females 47 years of age and older:   A BMI below 18.5 is considered underweight.  A BMI of 18.5 to 24.9 is normal.  A BMI of 25 to 29.9 is considered overweight.  A BMI of 30 and above is considered obese.  Watch levels of cholesterol and blood lipids  You should start  having your blood tested for lipids and cholesterol at 59 years of age, then have this test every 5 years.  You may need to have your cholesterol levels checked more often if:  Your lipid or cholesterol levels are high.  You are older than 59 years of age.  You are at high risk for heart disease.  CANCER SCREENING   Lung Cancer  Lung cancer screening is recommended for adults 80-57 years old who are at high risk for lung cancer because of a history of smoking.  A yearly low-dose CT scan of the lungs is recommended for people who:  Currently smoke.  Have quit within the past 15 years.  Have at least a 30-pack-year history of smoking. A pack year is smoking an average of one pack of cigarettes a day for 1 year.  Yearly screening should continue until it has been 15 years since you quit.  Yearly screening should stop if you develop a health problem that would prevent you from having lung cancer treatment.  Breast Cancer  Practice breast self-awareness. This means understanding how your breasts normally appear and feel.  It also means doing regular breast self-exams. Let your health care provider know about any changes, no matter how small.  If you are in your 20s or 30s, you should have a clinical breast exam (CBE) by a health care provider every 1-3 years as part of a regular health exam.  If you  are 73 or older, have a CBE every year. Also consider having a breast X-ray (mammogram) every year.  If you have a family history of breast cancer, talk to your health care provider about genetic screening.  If you are at high risk for breast cancer, talk to your health care provider about having an MRI and a mammogram every year.  Breast cancer gene (BRCA) assessment is recommended for women who have family members with BRCA-related cancers. BRCA-related cancers include:  Breast.  Ovarian.  Tubal.  Peritoneal cancers.  Results of the assessment will determine the need for  genetic counseling and BRCA1 and BRCA2 testing. Cervical Cancer Your health care provider may recommend that you be screened regularly for cancer of the pelvic organs (ovaries, uterus, and vagina). This screening involves a pelvic examination, including checking for microscopic changes to the surface of your cervix (Pap test). You may be encouraged to have this screening done every 3 years, beginning at age 42.  For women ages 3-65, health care providers may recommend pelvic exams and Pap testing every 3 years, or they may recommend the Pap and pelvic exam, combined with testing for human papilloma virus (HPV), every 5 years. Some types of HPV increase your risk of cervical cancer. Testing for HPV may also be done on women of any age with unclear Pap test results.  Other health care providers may not recommend any screening for nonpregnant women who are considered low risk for pelvic cancer and who do not have symptoms. Ask your health care provider if a screening pelvic exam is right for you.  If you have had past treatment for cervical cancer or a condition that could lead to cancer, you need Pap tests and screening for cancer for at least 20 years after your treatment. If Pap tests have been discontinued, your risk factors (such as having a new sexual partner) need to be reassessed to determine if screening should resume. Some women have medical problems that increase the chance of getting cervical cancer. In these cases, your health care provider may recommend more frequent screening and Pap tests. Colorectal Cancer  This type of cancer can be detected and often prevented.  Routine colorectal cancer screening usually begins at 59 years of age and continues through 59 years of age.  Your health care provider may recommend screening at an earlier age if you have risk factors for colon cancer.  Your health care provider may also recommend using home test kits to check for hidden blood in the  stool.  A small camera at the end of a tube can be used to examine your colon directly (sigmoidoscopy or colonoscopy). This is done to check for the earliest forms of colorectal cancer.  Routine screening usually begins at age 9.  Direct examination of the colon should be repeated every 5-10 years through 59 years of age. However, you may need to be screened more often if early forms of precancerous polyps or small growths are found. Skin Cancer  Check your skin from head to toe regularly.  Tell your health care provider about any new moles or changes in moles, especially if there is a change in a mole's shape or color.  Also tell your health care provider if you have a mole that is larger than the size of a pencil eraser.  Always use sunscreen. Apply sunscreen liberally and repeatedly throughout the day.  Protect yourself by wearing long sleeves, pants, a wide-brimmed hat, and sunglasses whenever you are outside.  HEART DISEASE, DIABETES, AND HIGH BLOOD PRESSURE   High blood pressure causes heart disease and increases the risk of stroke. High blood pressure is more likely to develop in:  People who have blood pressure in the high end of the normal range (130-139/85-89 mm Hg).  People who are overweight or obese.  People who are African American.  If you are 18-39 years of age, have your blood pressure checked every 3-5 years. If you are 40 years of age or older, have your blood pressure checked every year. You should have your blood pressure measured twice--once when you are at a hospital or clinic, and once when you are not at a hospital or clinic. Record the average of the two measurements. To check your blood pressure when you are not at a hospital or clinic, you can use:  An automated blood pressure machine at a pharmacy.  A home blood pressure monitor.  If you are between 55 years and 79 years old, ask your health care provider if you should take aspirin to prevent  strokes.  Have regular diabetes screenings. This involves taking a blood sample to check your fasting blood sugar level.  If you are at a normal weight and have a low risk for diabetes, have this test once every three years after 59 years of age.  If you are overweight and have a high risk for diabetes, consider being tested at a younger age or more often. PREVENTING INFECTION  Hepatitis B  If you have a higher risk for hepatitis B, you should be screened for this virus. You are considered at high risk for hepatitis B if:  You were born in a country where hepatitis B is common. Ask your health care provider which countries are considered high risk.  Your parents were born in a high-risk country, and you have not been immunized against hepatitis B (hepatitis B vaccine).  You have HIV or AIDS.  You use needles to inject street drugs.  You live with someone who has hepatitis B.  You have had sex with someone who has hepatitis B.  You get hemodialysis treatment.  You take certain medicines for conditions, including cancer, organ transplantation, and autoimmune conditions. Hepatitis C  Blood testing is recommended for:  Everyone born from 1945 through 1965.  Anyone with known risk factors for hepatitis C. Sexually transmitted infections (STIs)  You should be screened for sexually transmitted infections (STIs) including gonorrhea and chlamydia if:  You are sexually active and are younger than 59 years of age.  You are older than 59 years of age and your health care provider tells you that you are at risk for this type of infection.  Your sexual activity has changed since you were last screened and you are at an increased risk for chlamydia or gonorrhea. Ask your health care provider if you are at risk.  If you do not have HIV, but are at risk, it may be recommended that you take a prescription medicine daily to prevent HIV infection. This is called pre-exposure prophylaxis  (PrEP). You are considered at risk if:  You are sexually active and do not regularly use condoms or know the HIV status of your partner(s).  You take drugs by injection.  You are sexually active with a partner who has HIV. Talk with your health care provider about whether you are at high risk of being infected with HIV. If you choose to begin PrEP, you should first be tested for HIV. You   should then be tested every 3 months for as long as you are taking PrEP.  PREGNANCY   If you are premenopausal and you may become pregnant, ask your health care provider about preconception counseling.  If you may become pregnant, take 400 to 800 micrograms (mcg) of folic acid every day.  If you want to prevent pregnancy, talk to your health care provider about birth control (contraception). OSTEOPOROSIS AND MENOPAUSE   Osteoporosis is a disease in which the bones lose minerals and strength with aging. This can result in serious bone fractures. Your risk for osteoporosis can be identified using a bone density scan.  If you are 65 years of age or older, or if you are at risk for osteoporosis and fractures, ask your health care provider if you should be screened.  Ask your health care provider whether you should take a calcium or vitamin D supplement to lower your risk for osteoporosis.  Menopause may have certain physical symptoms and risks.  Hormone replacement therapy may reduce some of these symptoms and risks. Talk to your health care provider about whether hormone replacement therapy is right for you.  HOME CARE INSTRUCTIONS   Schedule regular health, dental, and eye exams.  Stay current with your immunizations.   Do not use any tobacco products including cigarettes, chewing tobacco, or electronic cigarettes.  If you are pregnant, do not drink alcohol.  If you are breastfeeding, limit how much and how often you drink alcohol.  Limit alcohol intake to no more than 1 drink per day for  nonpregnant women. One drink equals 12 ounces of beer, 5 ounces of wine, or 1 ounces of hard liquor.  Do not use street drugs.  Do not share needles.  Ask your health care provider for help if you need support or information about quitting drugs.  Tell your health care provider if you often feel depressed.  Tell your health care provider if you have ever been abused or do not feel safe at home.   This information is not intended to replace advice given to you by your health care provider. Make sure you discuss any questions you have with your health care provider.   Document Released: 07/29/2010 Document Revised: 02/03/2014 Document Reviewed: 12/15/2012 Elsevier Interactive Patient Education 2016 [employer]  

## 2015-09-18 NOTE — Assessment & Plan Note (Signed)
Uses albuterol once per month average. Not taking advair except during season change. Refill albuterol and no refill needed on advair.

## 2015-09-18 NOTE — Progress Notes (Signed)
Pre visit review using our clinic review tool, if applicable. No additional management support is needed unless otherwise documented below in the visit note. 

## 2015-09-19 NOTE — Assessment & Plan Note (Signed)
Declines treatment. LDL 162 down from 190 last year. She would like to continue with more weight loss to help. We talked again about her high risk from her family history.

## 2015-09-19 NOTE — Assessment & Plan Note (Signed)
Declines colonoscopy as well as overdue immunizations. Labs reviewed with her during the visit and she declines treatment for her high cholesterol. Is exercising and working on dieting. Down 20 pounds from last year and plans to continue. Counseled about sun safety and dangers of distracted driving. She did not do cologuard as it was not covered by her insurance. Given screening recommendations.

## 2015-09-19 NOTE — Assessment & Plan Note (Signed)
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 Non-smoker.

## 2015-09-19 NOTE — Assessment & Plan Note (Signed)
Working on weight loss and down 20 pounds, would like to be in 100s by next visit in 1 year.

## 2015-09-28 ENCOUNTER — Telehealth: Payer: Self-pay | Admitting: Emergency Medicine

## 2015-09-28 ENCOUNTER — Other Ambulatory Visit: Payer: Self-pay | Admitting: Geriatric Medicine

## 2015-09-28 MED ORDER — VITAMIN D (ERGOCALCIFEROL) 1.25 MG (50000 UNIT) PO CAPS: 50000.0000 [IU] | ORAL_CAPSULE | ORAL | 0 refills | Status: DC

## 2015-09-28 MED ORDER — TRIAMCINOLONE ACETONIDE 0.1 % EX CREA: 1.0000 "application " | TOPICAL_CREAM | Freq: Two times a day (BID) | CUTANEOUS | 3 refills | Status: DC

## 2015-09-28 NOTE — Telephone Encounter (Signed)
Sent to pharmacies.

## 2015-09-28 NOTE — Telephone Encounter (Signed)
Pt called and stated when she got home and checked she was almost out of her Vitamin D, Ergocalciferol, (DRISDOL) 50000 units CAPS capsule. Can she get a refill called into [facility] [place] Please. Also she would like to know if you can send the triamcinolone cream (KENALOG) 0.1 % into [facility] order pharmacy. Thanks.

## 2015-10-05 ENCOUNTER — Ambulatory Visit (INDEPENDENT_AMBULATORY_CARE_PROVIDER_SITE_OTHER): Payer: Managed Care, Other (non HMO) | Admitting: General Practice

## 2015-10-05 DIAGNOSIS — I82409 Acute embolism and thrombosis of unspecified deep veins of unspecified lower extremity: Secondary | ICD-10-CM

## 2015-10-05 DIAGNOSIS — I82509 Chronic embolism and thrombosis of unspecified deep veins of unspecified lower extremity: Secondary | ICD-10-CM | POA: Diagnosis not present

## 2015-10-05 LAB — POCT INR: INR: 1.9

## 2015-10-07 NOTE — Progress Notes (Signed)
I have reviewed and agree with the plan. 

## 2015-10-16 ENCOUNTER — Telehealth: Payer: Self-pay | Admitting: Oncology

## 2015-10-16 NOTE — Telephone Encounter (Signed)
10/27 Appointment rescheduled to 11/01 per patient request. Patient will be out of town during the week of 10/27.

## 2015-10-22 ENCOUNTER — Other Ambulatory Visit: Payer: Managed Care, Other (non HMO)

## 2015-10-22 ENCOUNTER — Other Ambulatory Visit (INDEPENDENT_AMBULATORY_CARE_PROVIDER_SITE_OTHER): Payer: Managed Care, Other (non HMO)

## 2015-10-22 DIAGNOSIS — E559 Vitamin D deficiency, unspecified: Secondary | ICD-10-CM

## 2015-10-22 DIAGNOSIS — Z0000 Encounter for general adult medical examination without abnormal findings: Secondary | ICD-10-CM

## 2015-10-23 LAB — VITAMIN D 25 HYDROXY (VIT D DEFICIENCY, FRACTURES): Vit D, 25-Hydroxy: 28 ng/mL — ABNORMAL LOW (ref 30–100)

## 2015-11-16 ENCOUNTER — Ambulatory Visit (INDEPENDENT_AMBULATORY_CARE_PROVIDER_SITE_OTHER): Payer: Managed Care, Other (non HMO) | Admitting: General Practice

## 2015-11-16 DIAGNOSIS — Z5181 Encounter for therapeutic drug level monitoring: Secondary | ICD-10-CM | POA: Diagnosis not present

## 2015-11-16 DIAGNOSIS — I82509 Chronic embolism and thrombosis of unspecified deep veins of unspecified lower extremity: Secondary | ICD-10-CM | POA: Diagnosis not present

## 2015-11-16 LAB — POCT INR: INR: 2

## 2015-11-16 NOTE — Progress Notes (Signed)
I have reviewed and agree with the plan. 

## 2015-11-16 NOTE — Patient Instructions (Signed)
Pre visit review using our clinic review tool, if applicable. No additional management support is needed unless otherwise documented below in the visit note. 

## 2015-11-22 ENCOUNTER — Other Ambulatory Visit: Payer: Self-pay | Admitting: General Practice

## 2015-11-22 ENCOUNTER — Telehealth: Payer: Self-pay | Admitting: *Deleted

## 2015-11-22 MED ORDER — WARFARIN SODIUM 5 MG PO TABS: ORAL_TABLET | ORAL | 1 refills | Status: DC

## 2015-11-22 NOTE — Telephone Encounter (Signed)
Left msg on triage pt is needing her coumadin sen to [facility]. Forwarding msg to cindy in coumadin clinic...Johny Chess

## 2015-11-22 NOTE — Telephone Encounter (Signed)
Megan Parrish approved med rx sent to [facility]...Megan Parrish

## 2015-11-23 ENCOUNTER — Ambulatory Visit: Payer: Managed Care, Other (non HMO) | Admitting: Oncology

## 2015-11-28 ENCOUNTER — Telehealth: Payer: Self-pay | Admitting: Oncology

## 2015-11-28 ENCOUNTER — Ambulatory Visit (HOSPITAL_BASED_OUTPATIENT_CLINIC_OR_DEPARTMENT_OTHER): Payer: Managed Care, Other (non HMO) | Admitting: Oncology

## 2015-11-28 VITALS — BP 119/83 | HR 86 | Temp 98.0°F | Resp 17 | Ht 69.0 in | Wt 231.9 lb

## 2015-11-28 DIAGNOSIS — I82509 Chronic embolism and thrombosis of unspecified deep veins of unspecified lower extremity: Principal | ICD-10-CM

## 2015-11-28 DIAGNOSIS — IMO0001 Reserved for inherently not codable concepts without codable children: Secondary | ICD-10-CM

## 2015-11-28 DIAGNOSIS — D6851 Activated protein C resistance: Secondary | ICD-10-CM

## 2015-11-28 DIAGNOSIS — Z86718 Personal history of other venous thrombosis and embolism: Secondary | ICD-10-CM | POA: Diagnosis not present

## 2015-11-28 DIAGNOSIS — Z7901 Long term (current) use of anticoagulants: Secondary | ICD-10-CM

## 2015-11-28 NOTE — Telephone Encounter (Signed)
Appointment scheduled per 11/1 LOS. Patient given AVS report and calendars with future scheduled appointments.

## 2015-11-28 NOTE — Progress Notes (Signed)
Hematology and Oncology Follow Up Visit  Megan Parrish [id number] 10/27/1955 60 y.o. 11/28/2015 3:56 PM  CC: Mateo Flow A. Asa Lente, MD    Principle Diagnosis: This is a 60 year old female with history of deep vein thrombosis. 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 The last one was diagnosed in January 2011.   Current therapy: She is anticoagulated with therapeutic doses of Coumadin since 01/2009.  Interim History:  Megan Parrish presents today for a followup visit. Since the last visit, she continues to do well and remains in excellent health. She has not had any recent thrombosis or bleeding episodes and remains very active.She has been on Coumadin and has been doing well. She did not report any  epistaxis, hematuria, hemoptysis or hematemesis..  She has not had any toxicities or hospitalization or any illnesses. INR has been therapeutic and have been checked every 6 weeks. She exercises regularly without any decline.   She does not report any headaches blurry vision or syncope. She does not report any chest pain, shortness of breath or palpitation. She does not report any nausea, vomiting, abdominal pain or change in her bowels. She does not report any genitourinary complaints. She does not report any skeletal complaints. Rest of review of systems unremarkable.   Medications: I have reviewed the patient's current medications.  Current Outpatient Prescriptions  Medication Sig Dispense Refill  . albuterol (PROAIR HFA) 108 (90 Base) MCG/ACT inhaler Inhale 2 puffs into the lungs every 4 (four) hours as needed. 18 g 3  . esomeprazole (NEXIUM) 40 MG capsule Take 40 mg by mouth daily at 12 noon.    . Fluticasone-Salmeterol (ADVAIR) 100-50 MCG/DOSE AEPB Inhale 1 puff into the lungs as needed.    . triamcinolone cream (KENALOG) 0.1 % Apply 1 application topically 2 (two) times daily. 80 g 3  . Vitamin D, Ergocalciferol, (DRISDOL) 50000 units CAPS capsule  Take 1 capsule (50,000 Units total) by mouth every 7 (seven) days. 12 capsule 0  . warfarin (COUMADIN) 5 MG tablet Take as directed by Anticoagulation clinic 144 tablet 1   No current facility-administered medications for this visit.     Allergies:  Allergies  Allergen Reactions  . Codeine Nausea And Vomiting    Past Medical History, Surgical history, Social history, and Family History were reviewed and updated.    Physical Exam: Blood pressure 119/83, pulse 86, temperature 98 F (36.7 C), temperature source Oral, resp. rate 17, height 5\' 9"  (1.753 m), weight 231 lb 14.4 oz (105.2 kg), SpO2 97 %. ECOG: 0 General appearance: Well-appearing woman without distress. Head: Normocephalic, without obvious abnormality Neck: no adenopathy Lymph nodes: Cervical, supraclavicular, and axillary nodes normal. Heart:regular rate and rhythm, S1, S2 normal, no murmur, click, rub or gallop Lung:chest clear, no wheezing, rales, normal symmetric air entry Abdomin: soft, non-tender, without masses or organomegaly no oral ulcers or lesions. EXT:no erythema, induration, or nodules   Lab Results: Lab Results  Component Value Date   WBC 5.5 08/13/2010   HGB 13.9 08/13/2010   HCT 41.8 08/13/2010   MCV 91.1 08/13/2010   PLT 169.0 08/13/2010     Chemistry      Component Value Date/Time   NA 142 07/03/2015 1539   K 4.3 07/03/2015 1539   CL 103 07/03/2015 1539   CO2 27 07/03/2015 1539   BUN 17 07/03/2015 1539   CREATININE 0.79 07/03/2015 1539   GLU 96 11/24/2012      Component Value  Date/Time   CALCIUM 9.6 07/03/2015 1539   ALKPHOS 79 07/03/2015 1539   AST 19 07/03/2015 1539   ALT 16 07/03/2015 1539   BILITOT 0.6 07/03/2015 1539       Impression and Plan:   60 year old female with:  1.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 The last one was right lower extremity diagnosed in January 2011.  She does have a factor V Leiden which is heterozygous.  Currently  anticoagulated with Coumadin.   Risks and benefits of continuing anticoagulation was reviewed and she is agreeable to continue on warfarin.We can consider using other agents (such as Pradaxa, Xarelto, etc) only if she fails coumadin.   2. Age-appropriate cancer screening: She is up-to-date at this time.  3. Follow-up: Will be in 12 months sooner if needed to.    [id number], MD 11/1/20173:56 PM

## 2015-12-15 ENCOUNTER — Other Ambulatory Visit: Payer: Self-pay | Admitting: Internal Medicine

## 2015-12-28 ENCOUNTER — Ambulatory Visit (INDEPENDENT_AMBULATORY_CARE_PROVIDER_SITE_OTHER): Payer: Managed Care, Other (non HMO) | Admitting: General Practice

## 2015-12-28 DIAGNOSIS — I82409 Acute embolism and thrombosis of unspecified deep veins of unspecified lower extremity: Secondary | ICD-10-CM

## 2015-12-28 LAB — POCT INR: INR: 1.6

## 2015-12-28 NOTE — Progress Notes (Signed)
I have reviewed and agree with the plan. 

## 2015-12-28 NOTE — Patient Instructions (Signed)
Pre visit review using our clinic review tool, if applicable. No additional management support is needed unless otherwise documented below in the visit note. 

## 2016-01-22 ENCOUNTER — Other Ambulatory Visit: Payer: Self-pay | Admitting: Internal Medicine

## 2016-02-01 ENCOUNTER — Ambulatory Visit (INDEPENDENT_AMBULATORY_CARE_PROVIDER_SITE_OTHER): Payer: Managed Care, Other (non HMO) | Admitting: General Practice

## 2016-02-01 DIAGNOSIS — Z5181 Encounter for therapeutic drug level monitoring: Secondary | ICD-10-CM | POA: Diagnosis not present

## 2016-02-01 LAB — POCT INR: INR: 2.5

## 2016-02-01 NOTE — Progress Notes (Signed)
I have reviewed and agree with the plan. 

## 2016-02-01 NOTE — Patient Instructions (Signed)
Pre visit review using our clinic review tool, if applicable. No additional management support is needed unless otherwise documented below in the visit note. 

## 2016-03-14 ENCOUNTER — Ambulatory Visit (INDEPENDENT_AMBULATORY_CARE_PROVIDER_SITE_OTHER): Payer: Managed Care, Other (non HMO) | Admitting: General Practice

## 2016-03-14 DIAGNOSIS — Z5181 Encounter for therapeutic drug level monitoring: Secondary | ICD-10-CM

## 2016-03-14 LAB — POCT INR: INR: 2

## 2016-03-14 NOTE — Progress Notes (Signed)
I have reviewed and agree with the plan. 

## 2016-03-14 NOTE — Patient Instructions (Signed)
Pre visit review using our clinic review tool, if applicable. No additional management support is needed unless otherwise documented below in the visit note. 

## 2016-03-26 ENCOUNTER — Ambulatory Visit (INDEPENDENT_AMBULATORY_CARE_PROVIDER_SITE_OTHER): Payer: Managed Care, Other (non HMO) | Admitting: Family

## 2016-03-26 ENCOUNTER — Ambulatory Visit (INDEPENDENT_AMBULATORY_CARE_PROVIDER_SITE_OTHER): Payer: Managed Care, Other (non HMO)

## 2016-03-26 ENCOUNTER — Telehealth: Payer: Self-pay | Admitting: Internal Medicine

## 2016-03-26 DIAGNOSIS — M79672 Pain in left foot: Secondary | ICD-10-CM | POA: Diagnosis not present

## 2016-03-26 DIAGNOSIS — M722 Plantar fascial fibromatosis: Secondary | ICD-10-CM

## 2016-03-26 DIAGNOSIS — M25562 Pain in left knee: Secondary | ICD-10-CM

## 2016-03-26 DIAGNOSIS — G8929 Other chronic pain: Secondary | ICD-10-CM

## 2016-03-26 DIAGNOSIS — M25561 Pain in right knee: Secondary | ICD-10-CM | POA: Diagnosis not present

## 2016-03-26 NOTE — Telephone Encounter (Signed)
Patient states she has planter fasciitis and wants to know if she can take aleve for two weeks while on coumadin.

## 2016-03-26 NOTE — Progress Notes (Signed)
Office Visit Note   Patient: Megan Parrish           Date of Birth: 11-04-1955           MRN: [id number] Visit Date: 03/26/2016              Requested by: Hoyt Koch, MD [place], [place] [id number] PCP: Hoyt Koch, MD  No chief complaint on file.   HPI: Left heel pain for several months. States that it is worse first am and then does get a little better as the day goes on. A month or so after that the pt states that she began having bilateral knee posterior pain. Complains of swelling and states that she has had to decrease her activities because of this. There is decreased ROM pt states that she is unable to fully extend her leg and especially notices this at night. She does say that her legs are more swollen than normal she does have a history of DVT x 2 in the right and x 1 in the left and is on blood thinner of this. Pamella Pert, RMA   The patient is a 61 year old woman who is seen today for initial evaluation of left heel pain which is been ongoing for several months. Complains that the pain is worse with start up. Intense pain first thing in the morning. As well as after rest. Has been working on heel cord stretching with minimal relief. Does schedule Coumadin clinic and states they did not allow her to take ibuprofen. She did try taking it for one week 1 tablet 1 time per day and this provided moderate relief of pain. Has orthotics that she wears for 2 hours in the morning these are old she wonders if they're worn out. Also complaining of bilateral knee pain and swelling. This waxes and wanes. The left is worse than the right. Complains of posterior pain pain with flexion pain at church when she must stand on an incline.    Assessment & Plan: Visit Diagnoses:  1. Pain of left heel   2. Chronic pain of right knee   3. Chronic pain of left knee   4. Plantar fasciitis     Plan: Offered Depo-Medrol injection left heel. Patient declined. She  states she will get with her cumulative nurse and take ibuprofen or Aleve for 2 weeks. Reinforced the importance of heel cord stretching. She will get new orthotics or better shoe wear with arch support to wear this around a lot. Recommended Aspercreme for her left knee.  Follow-Up Instructions: Return in about 4 weeks (around 04/23/2016).   Physical Exam  Constitutional: Appears well-developed.  Head: Normocephalic.  Eyes: EOM are normal.  Neck: Normal range of motion.  Cardiovascular: Normal rate.   Pulmonary/Chest: Effort normal.  Neurological: Is alert.  Skin: Skin is warm.  Psychiatric: Has a normal mood and affect.  Right Knee Exam   Tenderness  The patient is experiencing no tenderness.     Range of Motion  The patient has normal right knee ROM.  Muscle Strength   The patient has normal right knee strength.  Other  Other tests: no effusion present   Left Knee Exam   Tenderness  The patient is experiencing no tenderness.     Range of Motion  The patient has normal left knee ROM.  Muscle Strength   The patient has normal left knee strength.  Other  Effusion: no effusion present  Comments:  Pain with flexion.    Due to body habitus unable to appreciate level of swelling either knees.  Left foot: foot is plantigrade. Point tender to origin of plantar fascia on the left. No pain with lateral compression of the calcaneus.  Imaging: Xr Knee 1-2 Views Left  Result Date: 03/26/2016 Medial joint space narrowing. No acute findings.   Xr Foot 2 Views Left  Result Date: 03/26/2016 Calcaneal spurring seen. No fracture or acute finding.   Xr Knee 1-2 Views Right  Result Date: 03/26/2016 Medial joint space narrowing. No acute abnormality.   Orders:  Orders Placed This Encounter  Procedures  . XR Foot 2 Views Left  . XR Knee 1-2 Views Left  . XR Knee 1-2 Views Right   No orders of the defined types were placed in this encounter.     Procedures: No procedures performed  Clinical Data: No additional findings.  Subjective: Review of Systems  Objective: Vital Signs: There were no vitals taken for this visit.  Specialty Comments:  No specialty comments available.  PMFS History: Patient Active Problem List   Diagnosis Date Noted  . Routine general medical examination at a health care facility 06/23/2014  . Obese   . Hyperlipidemia 08/22/2010  . DVT, recurrent, lower extremity, chronic ([facility]) 03/11/2010  . THYROID NODULE 02/12/2009  . Asthma 02/09/2009  . GERD 02/09/2009   Past Medical History:  Diagnosis Date  . ALLERGIC RHINITIS   . Anxiety state, unspecified   . ASTHMA   . 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 Factor V deficiency (HCC)    chronic anticoag   . Fibroid   . GERD   . Palpitations   . THYROID NODULE 08/2008    Family History  Problem Relation Age of Onset  . Dementia Father   . Hypertension Father   . Hypertension Mother   . Thyroid disease Mother     parathyroid cyst removed  . Arthritis Other     Parent  . Uterine cancer Other     grandparent  . Ovarian cancer Maternal Grandmother     Past Surgical History:  Procedure Laterality Date  . NO PAST SURGERIES    . perianal cyst removal  2001   Social History   Occupational History  . Not on file.   Social History Main Topics  . Smoking status: Former Smoker    Packs/day: 1.00    Years: 30.00    Types: Cigarettes    Quit date: 04/13/2008  . Smokeless tobacco: Never Used     Comment: single, lives alone 1 dog and 3 cats. 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 Alcohol use 0.0 oz/week     Comment: 1 drink per month  . Drug use: No  . Sexual activity: No

## 2016-03-27 NOTE — Telephone Encounter (Signed)
I would not recommend this. She can try tylenol.

## 2016-03-27 NOTE — Telephone Encounter (Signed)
Patient contacted and stated awareness 

## 2016-03-27 NOTE — Telephone Encounter (Signed)
States that tylenol does not help, asked if she can take aleve once a day or take ibprophen.

## 2016-03-27 NOTE — Telephone Encounter (Signed)
No, this is not recommended as previously stated.

## 2016-04-25 ENCOUNTER — Ambulatory Visit: Payer: Managed Care, Other (non HMO)

## 2016-05-02 ENCOUNTER — Ambulatory Visit (INDEPENDENT_AMBULATORY_CARE_PROVIDER_SITE_OTHER): Payer: 59 | Admitting: General Practice

## 2016-05-02 DIAGNOSIS — Z5181 Encounter for therapeutic drug level monitoring: Secondary | ICD-10-CM

## 2016-05-02 DIAGNOSIS — I82509 Chronic embolism and thrombosis of unspecified deep veins of unspecified lower extremity: Secondary | ICD-10-CM

## 2016-05-02 NOTE — Progress Notes (Signed)
I have reviewed and agree with the plan. 

## 2016-05-02 NOTE — Patient Instructions (Signed)
Pre visit review using our clinic review tool, if applicable. No additional management support is needed unless otherwise documented below in the visit note. 

## 2016-05-05 ENCOUNTER — Other Ambulatory Visit: Payer: Self-pay | Admitting: Internal Medicine

## 2016-05-28 NOTE — Progress Notes (Signed)
[facility] [address], [place] [address] Phone: [phone] Subjective:    I'm seeing this patient by the request  of:    CC: Leg, knee and feet issues  MCN:[id number]  Megan Parrish is a 61 y.o. female coming in with complaint of lower leg pain bilaterally. 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 Patient has been having unfortunately bilateral heel pain for quite some time. Was left greater than right but now right greater left. Seems to be starting to radiate up her calves. Patient discusses it as a dull, throbbing aching pain. Seems to be worse after sitting for long amount of time and does get a little bit better with activity. Denies any recent injury. Patient does walk her dog regularly but does not work out regularly. Patient was doing Zumba classes but now is no longer doing that regularly. Patient states that this pain can cause her to have unfortunately bilateral knee pain as well.     Past Medical History:  Diagnosis Date  . ALLERGIC RHINITIS   . Anxiety state, unspecified   . ASTHMA   . 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 Factor V deficiency (HCC)    chronic anticoag   . Fibroid   . GERD   . Palpitations   . THYROID NODULE 08/2008   Past Surgical History:  Procedure Laterality Date  . NO PAST SURGERIES    . perianal cyst removal  2001   Social History   Social History  . Marital status: Single    Spouse name: N/A  . Number of children: N/A  . Years of education: N/A   Social History Main Topics  . Smoking status: Former Smoker    Packs/day: 1.00    Years: 30.00    Types: Cigarettes    Quit date: 04/13/2008  . Smokeless tobacco: Never Used     Comment: single, lives alone 1 dog and 3 cats. 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 Alcohol use 0.0 oz/week     Comment: 1 drink per month  . Drug use: No   . Sexual activity: No   Other Topics Concern  . None   Social History Narrative  . None   Allergies  Allergen Reactions  . Codeine Nausea And Vomiting   Family History  Problem Relation Age of Onset  . Dementia Father   . Hypertension Father   . Hypertension Mother   . Thyroid disease Mother     parathyroid cyst removed  . Arthritis Other     Parent  . Uterine cancer Other     grandparent  . Ovarian cancer Maternal Grandmother     Past medical history, social, surgical and family history all reviewed in electronic medical record.  No pertanent information unless stated regarding to the chief complaint.   Review of Systems:Review of systems updated and as accurate as of 05/29/16  No headache, visual changes, nausea, vomiting, diarrhea, constipation, dizziness, abdominal pain, skin rash, fevers, chills, night sweats, weight loss, swollen lymph nodes, body aches, joint swelling, muscle aches, chest pain, shortness of breath, mood changes.   Objective  Blood pressure 104/78, pulse 98, resp. rate 16, height 5\' 7"  (1.702 m), weight 238 lb 6 oz (108.1 kg), SpO2 98 %. Systems examined below as of 05/29/16   General: No apparent distress alert and oriented x3  mood and affect normal, dressed appropriately.  HEENT: Pupils equal, extraocular movements intact  Respiratory: Patient's speak in full sentences and does not appear short of breath  Cardiovascular: No lower extremity edema, non tender, no erythema  Skin: Warm dry intact with no signs of infection or rash on extremities or on axial skeleton.  Abdomen: Soft nontender  Neuro: Cranial nerves II through XII are intact, neurovascularly intact in all extremities with 2+ DTRs and 2+ pulses.  Lymph: No lymphadenopathy of posterior or anterior cervical chain or axillae bilaterally.  Gait Mild antalgic gait  MSK:  Non tender with full range of motion and good stability and symmetric strength and tone of shoulders, elbows, wrist, hip,  knee and ankles bilaterally.  Foot exam shows the patient does have a fairly wide forefoot. Patient does have breakdown of the longitudinal and transverse arch right greater than left. Mild overpronation of the medial aspect of the right foot. Tender to palpation of the medial calcaneal region. Full range of motion of the ankles.  Limited musculoskeletal ultrasound was performed and interpreted by Lyndal Pulley  Limited ultrasound of the foot is a show enlargement of the plantar fascial bilaterally area and patient measurement is 1.66 cm compared to 1.2 which would be regular in this individual. Impression: Plantar fasciitis  Procedure note 51102; 15 minutes spent for Therapeutic exercises as stated in above notes.  This included exercises focusing on stretching, strengthening, with significant focus on eccentric aspects. Exercises for the foot include:  Stretches to help lengthen the lower leg and plantar fascia areas Theraband exercises for the lower leg and ankle to help strengthen the surrounding area- dorsiflexion, plantarflexion, inversion, eversion Massage rolling on the plantar surface of the foot with a frozen bottle, tennis ball or golf ball theraband also given.   Towel or marble pick-ups to strengthen the plantar surface of the foot Weight bearing exercises to increase balance and overall stability    Proper technique shown and discussed handout in great detail with ATC.  All questions were discussed and answered.      Impression and Recommendations:     This case required medical decision making of moderate complexity.      Note: This dictation was prepared with Dragon dictation along with smaller phrase technology. Any transcriptional errors that result from this process are unintentional.

## 2016-05-29 ENCOUNTER — Ambulatory Visit: Payer: Self-pay

## 2016-05-29 ENCOUNTER — Encounter: Payer: Self-pay | Admitting: Family Medicine

## 2016-05-29 ENCOUNTER — Ambulatory Visit (INDEPENDENT_AMBULATORY_CARE_PROVIDER_SITE_OTHER): Payer: 59 | Admitting: Family Medicine

## 2016-05-29 VITALS — BP 104/78 | HR 98 | Resp 16 | Ht 67.0 in | Wt 238.4 lb

## 2016-05-29 DIAGNOSIS — M79605 Pain in left leg: Secondary | ICD-10-CM | POA: Diagnosis not present

## 2016-05-29 DIAGNOSIS — M79604 Pain in right leg: Secondary | ICD-10-CM | POA: Diagnosis not present

## 2016-05-29 DIAGNOSIS — M79672 Pain in left foot: Secondary | ICD-10-CM | POA: Diagnosis not present

## 2016-05-29 DIAGNOSIS — M722 Plantar fascial fibromatosis: Secondary | ICD-10-CM | POA: Diagnosis not present

## 2016-05-29 DIAGNOSIS — M79671 Pain in right foot: Secondary | ICD-10-CM | POA: Diagnosis not present

## 2016-05-29 MED ORDER — DICLOFENAC SODIUM 2 % TD SOLN: 2.0000 "application " | Freq: Two times a day (BID) | TRANSDERMAL | 3 refills | Status: DC

## 2016-05-29 NOTE — Patient Instructions (Signed)
Good to see you  Ice bath 10 minutes at night Exercises 3 times a week.  Avoid being barefoot [employer] orthotics "total support" online would be great  Good shoes with rigid bottom.  Megan Parrish, [place] or New balance greater then 700 Stay active but biking and elliptical would be better Zumba only 1 time a week.  Continue the vitamin D See me again in 4 weeks.

## 2016-05-29 NOTE — Assessment & Plan Note (Signed)
Patient does have bilateral plantar fasciitis. Discussed with patient at great length. We discussed icing regimen and home exercises. We discussed which activities to do a which was potentially avoid. We discussed proper shoes. Work with Product/process development scientist to learn home exercises. We discussed which activities to avoid. Follow-up again in 4 weeks

## 2016-05-29 NOTE — Addendum Note (Signed)
Addended by: Lyndal Pulley on: 05/29/2016 03:18 PM   Modules accepted: Orders

## 2016-06-01 ENCOUNTER — Other Ambulatory Visit: Payer: Self-pay | Admitting: Internal Medicine

## 2016-06-13 ENCOUNTER — Ambulatory Visit (INDEPENDENT_AMBULATORY_CARE_PROVIDER_SITE_OTHER): Payer: 59 | Admitting: General Practice

## 2016-06-13 DIAGNOSIS — Z5181 Encounter for therapeutic drug level monitoring: Secondary | ICD-10-CM

## 2016-06-13 LAB — POCT INR: INR: 2.5

## 2016-06-13 NOTE — Progress Notes (Signed)
I have reviewed and agree with the plan. 

## 2016-06-13 NOTE — Patient Instructions (Addendum)
Pre visit review using our clinic review tool, if applicable. No additional management support is needed unless otherwise documented below in the visit note.  Hold coumadin up to 5 days before oral surgery, please ask surgeon what they would prefer.  After surgery please take an extra 1/2 table for 2 days and then resume taking current dosage.

## 2016-06-26 ENCOUNTER — Ambulatory Visit (INDEPENDENT_AMBULATORY_CARE_PROVIDER_SITE_OTHER): Payer: 59 | Admitting: Family Medicine

## 2016-06-26 ENCOUNTER — Encounter: Payer: Self-pay | Admitting: Family Medicine

## 2016-06-26 DIAGNOSIS — M722 Plantar fascial fibromatosis: Secondary | ICD-10-CM

## 2016-06-26 NOTE — Patient Instructions (Addendum)
Good to see you  Megan Parrish is your friend.  I am impressed  Keep it up  Don't change a thing Biking would be great cardio When walking dog try on grass See me again before your trip if knees or feet still hurting.

## 2016-06-26 NOTE — Assessment & Plan Note (Signed)
Patient is improved at this time. Encourage patient to continue conservative therapy. Follow-up again in 6 weeks.

## 2016-06-26 NOTE — Progress Notes (Signed)
[name] [address] [place], [place] [address] Phone: [phone] Subjective:    I'm seeing this patient by the request  of:    CC: Leg, knee and feet issues f/u  [id number]  Megan Parrish is a 61 y.o. female coming in with complaint of lower leg pain bilaterally. 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  Patient was seen by me and was found to have more of a plantar fasciitis. Patient's was started with home exercises, over-the-counter orthotics, icing regimen. Patient was also given topical anti-inflammatories and once weekly vitamin D. Patient states Doing much better at this time. No significant new problems. Patient states that she is 50% better. Is wearing the shoes, cut the over-the-counter orthotics, as well as taking the medications. Very happy with the results of far.     Past Medical History:  Diagnosis Date  . ALLERGIC RHINITIS   . Anxiety state, unspecified   . ASTHMA   . 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 Factor V deficiency (HCC)    chronic anticoag   . Fibroid   . GERD   . Palpitations   . THYROID NODULE 08/2008   Past Surgical History:  Procedure Laterality Date  . NO PAST SURGERIES    . perianal cyst removal  2001   Social History   Social History  . Marital status: Single    Spouse name: N/A  . Number of children: N/A  . Years of education: N/A   Social History Main Topics  . Smoking status: Former Smoker    Packs/day: 1.00    Years: 30.00    Types: Cigarettes    Quit date: 04/13/2008  . Smokeless tobacco: Never Used     Comment: single, lives alone 1 dog and 3 cats. 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 Alcohol use 0.0 oz/week     Comment: 1 drink per month  . Drug use: No  . Sexual activity: No   Other Topics Concern  . None   Social History Narrative  . None   Allergies    Allergen Reactions  . Codeine Nausea And Vomiting   Family History  Problem Relation Age of Onset  . Dementia Father   . Hypertension Father   . Hypertension Mother   . Thyroid disease Mother        parathyroid cyst removed  . Arthritis Other        Parent  . Uterine cancer Other        grandparent  . Ovarian cancer Maternal Grandmother     Past medical history, social, surgical and family history all reviewed in electronic medical record.  No pertanent information unless stated regarding to the chief complaint.   Review of Systems: No headache, visual changes, nausea, vomiting, diarrhea, constipation, dizziness, abdominal pain, skin rash, fevers, chills, night sweats, weight loss, swollen lymph nodes, body aches, joint swelling, muscle aches, chest pain, shortness of breath, mood changes.    Objective  Blood pressure 92/70, pulse 82, height 5\' 6"  (1.676 m), weight 240 lb (108.9 kg).   Systems examined below as of 06/26/16 General: NAD A&O x3 mood, affect normal  HEENT: Pupils equal, extraocular movements intact no nystagmus Respiratory: not short of breath at rest or with speaking Cardiovascular: No lower extremity edema, non tender Skin: Warm dry intact with no signs of infection  or rash on extremities or on axial skeleton. Abdomen: Soft nontender, no masses Neuro: Cranial nerves  intact, neurovascularly intact in all extremities with 2+ DTRs and 2+ pulses. Lymph: No lymphadenopathy appreciated today  Gait Mild antalgic gait  MSK:  Non tender with full range of motion and good stability and symmetric strength and tone of shoulders, elbows, wrist, hip, knee and ankles bilaterally. Mild August for review in the knees bilaterally Foot exam shows the patient does have a fairly wide forefoot. Patient does have breakdown of the longitudinal and transverse arch right greater than left. Mild overpronation of the medial aspect of the right foot. Nontender over the medial calcaneal  region. Full range of motion of the ankles.       Impression and Recommendations:     This case required medical decision making of moderate complexity.      Note: This dictation was prepared with Dragon dictation along with smaller phrase technology. Any transcriptional errors that result from this process are unintentional.

## 2016-07-08 ENCOUNTER — Other Ambulatory Visit (HOSPITAL_COMMUNITY)
Admission: RE | Admit: 2016-07-08 | Discharge: 2016-07-08 | Disposition: A | Discharge status: Home / Self Care | Payer: 59 | Source: Ambulatory Visit | Attending: Nurse Practitioner | Admitting: Nurse Practitioner

## 2016-07-08 ENCOUNTER — Ambulatory Visit (INDEPENDENT_AMBULATORY_CARE_PROVIDER_SITE_OTHER): Payer: 59 | Admitting: Nurse Practitioner

## 2016-07-08 ENCOUNTER — Encounter: Payer: Self-pay | Admitting: Nurse Practitioner

## 2016-07-08 VITALS — BP 116/80 | HR 80 | Ht 66.0 in | Wt 238.0 lb

## 2016-07-08 DIAGNOSIS — N951 Menopausal and female climacteric states: Secondary | ICD-10-CM | POA: Diagnosis not present

## 2016-07-08 DIAGNOSIS — Z0000 Encounter for general adult medical examination without abnormal findings: Secondary | ICD-10-CM | POA: Insufficient documentation

## 2016-07-08 DIAGNOSIS — E559 Vitamin D deficiency, unspecified: Secondary | ICD-10-CM | POA: Diagnosis not present

## 2016-07-08 DIAGNOSIS — Z01419 Encounter for gynecological examination (general) (routine) without abnormal findings: Secondary | ICD-10-CM | POA: Diagnosis not present

## 2016-07-08 MED ORDER — VITAMIN D (ERGOCALCIFEROL) 1.25 MG (50000 UNIT) PO CAPS: ORAL_CAPSULE | ORAL | 4 refills | Status: DC

## 2016-07-08 MED ORDER — TRIAMCINOLONE ACETONIDE 0.1 % EX CREA: 1.0000 "application " | TOPICAL_CREAM | Freq: Two times a day (BID) | CUTANEOUS | 3 refills | Status: DC

## 2016-07-08 NOTE — Progress Notes (Signed)
Patient ID: Megan Parrish, female   DOB: Sep 29, 1955, 61 y.o.   MRN: [id number]  61 y.o. G0P0000 Single  Caucasian Fe here for annual exam.  No vaginal bleeding or spotting since last April. Some warm spells but tolerable.  She remains on Coumadin for DVT.  Now having problems with plantar fascitis.  Patient's last menstrual period was 04/28/2015 (approximate).          Sexually active: No.  The current method of family planning is abstinence.    Exercising: Yes.    walking with dog Smoker:  no  Health Maintenance: Pap: 04/11/13, Negative with neg HR HPV  2012, Negative, unsure if had HR HPV testing History of Abnormal Pap: no MMG: 06/23/14, 3D-no, Density Category C, Bi-Rads 1:  Negative Self Breast exams: sometimes Colonoscopy: Never, IFOB testing with PCP BMD: Never  TDaP: 10/16/08 Shingles: Not indicated due to age Pneumonia: Not indicated due to age Hep C and HIV: 07/03/15 Labs: Health Screen through work, we follow Vitamin D   reports that she quit smoking about 8 years ago. Her smoking use included Cigarettes. She has a 30.00 pack-year smoking history. She has never used smokeless tobacco. She reports that she drinks alcohol. She reports that she does not use drugs.  Past Medical History:  Diagnosis Date  . ALLERGIC RHINITIS   . Anxiety state, unspecified   . ASTHMA   . 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 Factor V deficiency (HCC)    chronic anticoag   . Fibroid   . GERD   . Palpitations   . Plantar fasciitis 2017  . THYROID NODULE 08/2008    Past Surgical History:  Procedure Laterality Date  . NO PAST SURGERIES    . perianal cyst removal  2001    Current Outpatient Prescriptions  Medication Sig Dispense Refill  . albuterol (PROAIR HFA) 108 (90 Base) MCG/ACT inhaler Inhale 2 puffs into the lungs every 4 (four) hours as needed. 18 g 3  . Diclofenac Sodium (PENNSAID) 2 % SOLN Place 2 application onto the skin 2 (two) times  daily. 112 g 3  . esomeprazole (NEXIUM) 40 MG capsule Take 40 mg by mouth daily at 12 noon.    Marland Kitchen JANTOVEN 5 MG tablet TAKE AS DIRECTED BY        ANTICOAGULATION CLINIC 144 tablet 1  . triamcinolone cream (KENALOG) 0.1 % Apply 1 application topically 2 (two) times daily. 80 g 3  . Vitamin D, Ergocalciferol, (DRISDOL) 50000 units CAPS capsule TAKE 1 CAPSULE EVERY 7 DAYS 12 capsule 0  . Fluticasone-Salmeterol (ADVAIR) 100-50 MCG/DOSE AEPB Inhale 1 puff into the lungs as needed.     No current facility-administered medications for this visit.     Family History  Problem Relation Age of Onset  . Dementia Father   . Hypertension Father   . Hypertension Mother   . Thyroid disease Mother        parathyroid cyst removed  . Arthritis Other        Parent  . Uterine cancer Other        grandparent  . Ovarian cancer Maternal Grandmother     ROS:  Pertinent items are noted in HPI.  Otherwise, a comprehensive ROS was negative.  Exam:   BP 116/80 (BP Location: Right Arm, Patient Position: Sitting, Cuff Size: Large)   Pulse 80   Ht 5\' 6"  (1.676 m)   Wt 238 lb (108  kg)   LMP 04/28/2015 (Approximate)   BMI 38.41 kg/m  Height: 5\' 6"  (167.6 cm) Ht Readings from Last 3 Encounters:  07/08/16 5\' 6"  (1.676 m)  06/26/16 5\' 6"  (1.676 m)  05/29/16 5\' 7"  (1.702 m)    General appearance: alert, cooperative and appears stated age Head: Normocephalic, without obvious abnormality, atraumatic Neck: no adenopathy, supple, symmetrical, trachea midline and thyroid normal to inspection and palpation Lungs: clear to auscultation bilaterally Breasts: normal appearance, no masses or tenderness Heart: regular rate and rhythm Abdomen: soft, non-tender; no masses,  no organomegaly Extremities: extremities normal, atraumatic, no cyanosis or edema Skin: Skin color, texture, turgor normal. No rashes or lesions Lymph nodes: Cervical, supraclavicular, and axillary nodes normal. No abnormal inguinal nodes  palpated Neurologic: Grossly normal   Pelvic: External genitalia:   Lesions c/w LSA without flare              Urethra:  normal appearing urethra with no masses, tenderness or lesions              Bartholin's and Skene's: normal                 Vagina: normal appearing vagina with normal color and discharge, no lesions              Cervix: anteverted              Pap taken: Yes.   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

## 2016-07-08 NOTE — Patient Instructions (Signed)

## 2016-07-09 ENCOUNTER — Telehealth: Payer: Self-pay | Admitting: Nurse Practitioner

## 2016-07-09 LAB — VITAMIN D 25 HYDROXY (VIT D DEFICIENCY, FRACTURES): Vit D, 25-Hydroxy: 28.6 ng/mL — ABNORMAL LOW (ref 30.0–100.0)

## 2016-07-09 LAB — FOLLICLE STIMULATING HORMONE: FSH: 40.9 m[IU]/mL

## 2016-07-09 NOTE — Telephone Encounter (Signed)
Pt is left a message on home and cell phone to call back about lab test results:  Asked her to speak with triage nurse.  The [facility] does show her in menopausal range at 40.9.  I have discussed this with Dr. Quincy Simmonds and we do not have to do a Provera challenge.  But if she gets any bleeding at all she is to call.  The Vit D was still low at 28.6 compared to 8 months ago at 45.  She must be compliant to taking RX Vit D every 7 days.

## 2016-07-10 LAB — CYTOLOGY - PAP
Diagnosis: NEGATIVE
HPV: NOT DETECTED

## 2016-07-10 NOTE — Telephone Encounter (Signed)
Spoke with patient. Advised of message as seen below from [place], [place]. Patient is agreeable and verbalizes understanding. States she has been taking Vitamin D2 50,000 IU weekly on Sundays every week.  Routing to [employer] CNM for review and advise of patient's Vitamin D rx as Vitamin D level is still low.

## 2016-07-10 NOTE — Progress Notes (Signed)
Pt also needs to be counseled about Cologuard for colonoscopy screening.  Reviewed personally.  Felipa Emory, MD.

## 2016-07-10 NOTE — Telephone Encounter (Signed)
Patient returning call about her labs

## 2016-07-10 NOTE — Telephone Encounter (Signed)
Patient returning your call.

## 2016-07-10 NOTE — Telephone Encounter (Signed)
Attempted to reach patient at [phone], phone rang and rang with no answer or voicemail. Left message at [phone], okay per ROI to return call to the office.

## 2016-07-24 ENCOUNTER — Encounter: Payer: Self-pay | Admitting: Family Medicine

## 2016-07-24 ENCOUNTER — Ambulatory Visit (INDEPENDENT_AMBULATORY_CARE_PROVIDER_SITE_OTHER): Payer: 59 | Admitting: Family Medicine

## 2016-07-24 VITALS — BP 110/80 | HR 72 | Ht 66.0 in | Wt 239.0 lb

## 2016-07-24 DIAGNOSIS — M722 Plantar fascial fibromatosis: Secondary | ICD-10-CM | POA: Diagnosis not present

## 2016-07-24 MED ORDER — NITROGLYCERIN 0.2 MG/HR TD PT24: MEDICATED_PATCH | TRANSDERMAL | 1 refills | Status: DC

## 2016-07-24 NOTE — Assessment & Plan Note (Signed)
Mild improvement but still limiting.  Discussed icing started nitro. Warned the potential side effect. We discussed icing regimen. Patient come back and see me again in 4 weeks start formal physical therapy.

## 2016-07-24 NOTE — Patient Instructions (Addendum)
Good to see you  Alvera Singh is your friend.  Keep using the shoes and the orthotics.  Exercises as many days a week as you can  Physical therapy will be calling you.   Nitroglycerin Protocol   Apply 1/4 nitroglycerin patch to affected area daily.  Change position of patch within the affected area every 24 hours.  You may experience a headache during the first 1-2 weeks of using the patch, these should subside.  If you experience headaches after beginning nitroglycerin patch treatment, you may take your preferred over the counter pain reliever.  Another side effect of the nitroglycerin patch is skin irritation or rash related to patch adhesive.  Please notify our office if you develop more severe headaches or rash, and stop the patch.  Tendon healing with nitroglycerin patch may require 12 to 24 weeks depending on the extent of injury.  Men should not use if taking Viagra, Cialis, or Levitra.   Do not use if you have migraines or rosacea.  See me again in 4 weeks.

## 2016-07-24 NOTE — Progress Notes (Signed)
[name] [address], [place] [address] Phone: [phone] Subjective:    I'm seeing this patient by the request  of:    CC: Leg, knee and feet issues f/u  [id number]  Megan Parrish is a 61 y.o. female coming in with complaint of lower leg pain bilaterally. 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  Patient was seen by me and was found to have more of a plantar fasciitis. Patient's was started with home exercises, over-the-counter orthotics, icing regimen. Patient was also given topical anti-inflammatories and once weekly vitamin D. Patient last exam was doing 50% better. Patient states She is doing another about 10% better. Still having discomfort. Patient is going out of town is somewhat concerning. Patient is trying to be active but he finding it difficult. Patient denies any swelling.    Past Medical History:  Diagnosis Date  . ALLERGIC RHINITIS   . Anxiety state, unspecified   . ASTHMA   . 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 Factor V deficiency (HCC)    chronic anticoag   . Fibroid   . GERD   . Palpitations   . Plantar fasciitis 2017  . THYROID NODULE 08/2008   Past Surgical History:  Procedure Laterality Date  . NO PAST SURGERIES    . perianal cyst removal  2001   Social History   Social History  . Marital status: Single    Spouse name: N/A  . Number of children: N/A  . Years of education: N/A   Social History Main Topics  . Smoking status: Former Smoker    Packs/day: 1.00    Years: 30.00    Types: Cigarettes    Quit date: 04/13/2008  . Smokeless tobacco: Never Used     Comment: single, lives alone 1 dog and 3 cats. 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 Alcohol use 0.0 oz/week     Comment: 1 drink per month  . Drug use: No  . Sexual activity: No   Other Topics Concern  . None   Social  History Narrative  . None   Allergies  Allergen Reactions  . Codeine Nausea And Vomiting   Family History  Problem Relation Age of Onset  . Dementia Father   . Hypertension Father   . Hypertension Mother   . Thyroid disease Mother        parathyroid cyst removed  . Arthritis Other        Parent  . Uterine cancer Other        grandparent  . Ovarian cancer Maternal Grandmother     Past medical history, social, surgical and family history all reviewed in electronic medical record.  No pertanent information unless stated regarding to the chief complaint.   Review of Systems: No headache, visual changes, nausea, vomiting, diarrhea, constipation, dizziness, abdominal pain, skin rash, fevers, chills, night sweats, weight loss, swollen lymph nodes, body aches, joint swelling, muscle aches, chest pain, shortness of breath, mood changes.   Objective  Blood pressure 110/80, pulse 72, height 5\' 6"  (1.676 m), weight 239 lb (108.4 kg).   Systems examined below as of 07/24/16 General: NAD A&O x3 mood, affect normal  HEENT: Pupils equal, extraocular movements intact no nystagmus Respiratory: not short of breath at rest or with speaking Cardiovascular: No lower extremity edema, non tender Skin: Warm dry intact  with no signs of infection or rash on extremities or on axial skeleton. Abdomen: Soft nontender, no masses Neuro: Cranial nerves  intact, neurovascularly intact in all extremities with 2+ DTRs and 2+ pulses. Lymph: No lymphadenopathy appreciated today  Gait normal with good balance and coordination.  MSK: Non tender with full range of motion and good stability and symmetric strength and tone of shoulders, elbows, wrist,  knee hips and ankles bilaterally.    Foot exam shows the patient does have a fairly wide forefoot. Patient does have breakdown of the longitudinal and transverse arch right greater than left. Mild overpronation of the medial aspect of the right foot. Still significant  tenderness on the calcaneal region on the plantar aspect of the foot but not as much on the medial calcaneal area.     Impression and Recommendations:     This case required medical decision making of moderate complexity.      Note: This dictation was prepared with Dragon dictation along with smaller phrase technology. Any transcriptional errors that result from this process are unintentional.

## 2016-07-25 ENCOUNTER — Ambulatory Visit (INDEPENDENT_AMBULATORY_CARE_PROVIDER_SITE_OTHER): Payer: 59 | Admitting: General Practice

## 2016-07-25 DIAGNOSIS — Z5181 Encounter for therapeutic drug level monitoring: Secondary | ICD-10-CM | POA: Diagnosis not present

## 2016-07-25 LAB — POCT INR: INR: 2.5

## 2016-07-25 NOTE — Patient Instructions (Signed)
Pre visit review using our clinic review tool, if applicable. No additional management support is needed unless otherwise documented below in the visit note. 

## 2016-07-25 NOTE — Progress Notes (Signed)
I have reviewed and agree with the plan. 

## 2016-08-12 ENCOUNTER — Telehealth: Payer: Self-pay | Admitting: Physical Therapy

## 2016-08-12 NOTE — Telephone Encounter (Signed)
Spoke with patient on 07/28/16, going out of town [place] again on 08/07/16, left message, no return call

## 2016-08-21 ENCOUNTER — Ambulatory Visit: Payer: 59 | Admitting: Family Medicine

## 2016-08-24 ENCOUNTER — Other Ambulatory Visit: Payer: Self-pay | Admitting: Internal Medicine

## 2016-08-29 ENCOUNTER — Telehealth: Payer: Self-pay | Admitting: Obstetrics and Gynecology

## 2016-08-29 NOTE — Telephone Encounter (Signed)
Left message for patient to call to reschedule Megan Parrish appointment. °

## 2016-09-04 NOTE — Patient Instructions (Signed)
Pre visit review using our clinic review tool, if applicable. No additional management support is needed unless otherwise documented below in the visit note. 

## 2016-09-05 ENCOUNTER — Ambulatory Visit (INDEPENDENT_AMBULATORY_CARE_PROVIDER_SITE_OTHER): Payer: 59 | Admitting: General Practice

## 2016-09-05 DIAGNOSIS — Z5181 Encounter for therapeutic drug level monitoring: Secondary | ICD-10-CM | POA: Diagnosis not present

## 2016-09-05 LAB — POCT INR: INR: 3.5

## 2016-10-01 ENCOUNTER — Ambulatory Visit (INDEPENDENT_AMBULATORY_CARE_PROVIDER_SITE_OTHER): Payer: 59 | Admitting: General Practice

## 2016-10-01 DIAGNOSIS — Z5181 Encounter for therapeutic drug level monitoring: Secondary | ICD-10-CM | POA: Diagnosis not present

## 2016-10-01 LAB — POCT INR: INR: 2.6

## 2016-10-01 NOTE — Patient Instructions (Signed)
Pre visit review using our clinic review tool, if applicable. No additional management support is needed unless otherwise documented below in the visit note. 

## 2016-10-13 ENCOUNTER — Other Ambulatory Visit (INDEPENDENT_AMBULATORY_CARE_PROVIDER_SITE_OTHER): Payer: 59

## 2016-10-13 ENCOUNTER — Other Ambulatory Visit: Payer: Self-pay

## 2016-10-13 DIAGNOSIS — E559 Vitamin D deficiency, unspecified: Secondary | ICD-10-CM

## 2016-10-14 LAB — VITAMIN D 25 HYDROXY (VIT D DEFICIENCY, FRACTURES): VIT D 25 HYDROXY: 31.9 ng/mL (ref 30.0–100.0)

## 2016-10-29 ENCOUNTER — Other Ambulatory Visit: Payer: Self-pay | Admitting: Internal Medicine

## 2016-11-07 ENCOUNTER — Ambulatory Visit (INDEPENDENT_AMBULATORY_CARE_PROVIDER_SITE_OTHER): Payer: 59 | Admitting: General Practice

## 2016-11-07 DIAGNOSIS — Z7901 Long term (current) use of anticoagulants: Secondary | ICD-10-CM | POA: Insufficient documentation

## 2016-11-07 LAB — POCT INR: INR: 2.5

## 2016-11-07 NOTE — Patient Instructions (Signed)
Pre visit review using our clinic review tool, if applicable. No additional management support is needed unless otherwise documented below in the visit note. 

## 2016-11-25 ENCOUNTER — Other Ambulatory Visit: Payer: Self-pay | Admitting: Internal Medicine

## 2016-11-25 ENCOUNTER — Telehealth: Payer: Self-pay | Admitting: Oncology

## 2016-11-25 NOTE — Telephone Encounter (Signed)
Patient called in to cancel she will call back to reschedule

## 2016-11-27 ENCOUNTER — Ambulatory Visit: Payer: Managed Care, Other (non HMO) | Admitting: Oncology

## 2016-12-09 ENCOUNTER — Ambulatory Visit (INDEPENDENT_AMBULATORY_CARE_PROVIDER_SITE_OTHER): Payer: 59 | Admitting: General Practice

## 2016-12-09 DIAGNOSIS — Z7901 Long term (current) use of anticoagulants: Secondary | ICD-10-CM

## 2016-12-09 LAB — POCT INR: INR: 2.6

## 2016-12-09 NOTE — Progress Notes (Signed)
Medical treatment/procedure(s) were performed by non-physician practitioner and as supervising physician I was immediately available for consultation/collaboration. I agree with above. Vash Quezada A Nathaneil Feagans, MD  

## 2016-12-09 NOTE — Patient Instructions (Addendum)
.  Pre visit review using our clinic review tool, if applicable. No additional management support is needed unless otherwise documented below in the visit note.  Continue to take 10 mg all days except 5 mg on Sunday/Tuesday/Thursday.  Re-check in 8 weeks per patient request.

## 2016-12-12 ENCOUNTER — Ambulatory Visit: Payer: 59

## 2016-12-15 ENCOUNTER — Encounter: Payer: Self-pay | Admitting: Family Medicine

## 2016-12-15 ENCOUNTER — Ambulatory Visit (INDEPENDENT_AMBULATORY_CARE_PROVIDER_SITE_OTHER): Payer: 59 | Admitting: Family Medicine

## 2016-12-15 VITALS — BP 116/78 | HR 96 | Ht 67.0 in | Wt 241.0 lb

## 2016-12-15 DIAGNOSIS — M25569 Pain in unspecified knee: Secondary | ICD-10-CM

## 2016-12-15 DIAGNOSIS — M170 Bilateral primary osteoarthritis of knee: Secondary | ICD-10-CM | POA: Diagnosis not present

## 2016-12-15 NOTE — Assessment & Plan Note (Signed)
Bilateral injections given today.  We discussed icing regimen and home exercise.  We discussed which activities to do and which ones to avoid.  Discussed with patient about the vitamin D.  Patient is going to do the topical anti-inflammatories.  Responded well to the injection today.  Patient could be a candidate for Visco supplementation if needed.  Follow-up in 4 weeks

## 2016-12-15 NOTE — Patient Instructions (Signed)
Good to see you  Ice 20 minutes 2 times daily. Usually after activity and before bed. Exercises 3 times a week.  pennsaid pinkie amount topically 2 times daily as needed.  Keep wearing the good shoes.  See me again in 4 weeks if no improvement we may need to consider bracing or orthovisc.

## 2016-12-15 NOTE — Progress Notes (Signed)
[name] [place],  [address] Phone: [phone] Subjective:    CC: b/l knee pain  [id number]  Megan Parrish is a 61 y.o. female coming in for left knee pain. She has also had pain in the right. She attributes this to her foot pain that changed her gait. She has constant pain which intensifies with deep knee bending or standing on her feet for a prolonged period of time. She states that her entire knee aches with possible pinpoint pain on the medial aspect.  Patient states that this can be accompanied with some instability.  Denies going up and down stairs has become quite difficult.  Patient states even at night there is significant soreness.  And possibly losing some range of motion of the left knee.     Past Medical History:  Diagnosis Date  . ALLERGIC RHINITIS   . Anxiety state, unspecified   . ASTHMA   . 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 Factor V deficiency (HCC)    chronic anticoag   . Fibroid   . GERD   . Palpitations   . Plantar fasciitis 2017  . THYROID NODULE 08/2008   Past Surgical History:  Procedure Laterality Date  . NO PAST SURGERIES    . perianal cyst removal  2001   Social History   Socioeconomic History  . Marital status: Single    Spouse name: Not on file  . Number of children: Not on file  . Years of education: Not on file  . Highest education level: Not on file  Social Needs  . Financial resource strain: Not on file  . Food insecurity - worry: Not on file  . Food insecurity - inability: Not on file  . Transportation needs - medical: Not on file  . Transportation needs - non-medical: Not on file  Occupational History  . Not on file  Tobacco Use  . Smoking status: Former Smoker    Packs/day: 1.00    Years: 30.00    Pack years: 30.00    Types: Cigarettes    Last attempt to quit: 04/13/2008    Years since quitting: 8.6  . Smokeless tobacco:  Never Used  . Tobacco comment: single, lives alone 1 dog and 3 cats. 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 Alcohol use: Yes    Alcohol/week: 0.0 oz    Comment: 1 drink per month  . Drug use: No  . Sexual activity: No    Partners: Male    Birth control/protection: Abstinence  Other Topics Concern  . Not on file  Social History Narrative  . Not on file   Allergies  Allergen Reactions  . Codeine Nausea And Vomiting   Family History  Problem Relation Age of Onset  . Dementia Father   . Hypertension Father   . Hypertension Mother   . Thyroid disease Mother        parathyroid cyst removed  . Arthritis Other        Parent  . Uterine cancer Other        grandparent  . Ovarian cancer Maternal Grandmother      Past medical history, social, surgical and family history all reviewed in electronic medical record.  No pertanent information unless stated regarding to the chief complaint.   Review of Systems:Review of systems updated and as accurate as of  12/15/16  No headache, visual changes, nausea, vomiting, diarrhea, constipation, dizziness, abdominal pain, skin rash, fevers, chills, night sweats, weight loss, swollen lymph nodes, body aches, joint swelling, chest pain, shortness of breath, mood changes.  Positive muscle aches  Objective  There were no vitals taken for this visit. Systems examined below as of 12/15/16   General: No apparent distress alert and oriented x3 mood and affect normal, dressed appropriately.  HEENT: Pupils equal, extraocular movements intact  Respiratory: Patient's speak in full sentences and does not appear short of breath  Cardiovascular: No lower extremity edema, non tender, no erythema  Skin: Warm dry intact with no signs of infection or rash on extremities or on axial skeleton.  Abdomen: Soft nontender  Neuro: Cranial nerves II through XII are intact, neurovascularly intact in all extremities with 2+ DTRs  and 2+ pulses.  Lymph: No lymphadenopathy of posterior or anterior cervical chain or axillae bilaterally.  Gait antalgic gait MSK:  Non tender with full range of motion and good stability and symmetric strength and tone of shoulders, elbows, wrist, hip, and ankles bilaterally.  Mild arthritic changes in multiple joints  Knee: Bilateral valgus deformity noted. Large thigh to calf ratio.  Tender to palpation over medial and PF joint line.  ROM full in flexion and extension and lower leg rotation. instability with valgus force.  painful patellar compression. Patellar glide with moderate crepitus. Patellar and quadriceps tendons unremarkable. Hamstring and quadriceps strength is normal.  After informed written and verbal consent, patient was seated on exam table. Right knee was prepped with alcohol swab and utilizing anterolateral approach, patient's right knee space was injected with 4:1  marcaine 0.5%: Kenalog 40mg /dL. Patient tolerated the procedure well without immediate complications.  After informed written and verbal consent, patient was seated on exam table. Left knee was prepped with alcohol swab and utilizing anterolateral approach, patient's left knee space was injected with 4:1  marcaine 0.5%: Kenalog 40mg /dL. Patient tolerated the procedure well without immediate complications.    Impression and Recommendations:     This case required medical decision making of moderate complexity.      Note: This dictation was prepared with Dragon dictation along with smaller phrase technology. Any transcriptional errors that result from this process are unintentional.

## 2017-01-13 ENCOUNTER — Ambulatory Visit (INDEPENDENT_AMBULATORY_CARE_PROVIDER_SITE_OTHER): Payer: 59 | Admitting: Family Medicine

## 2017-01-13 ENCOUNTER — Encounter: Payer: Self-pay | Admitting: Family Medicine

## 2017-01-13 DIAGNOSIS — M170 Bilateral primary osteoarthritis of knee: Secondary | ICD-10-CM | POA: Diagnosis not present

## 2017-01-13 NOTE — Assessment & Plan Note (Signed)
Stable overall though.  Patient was having some increasing instability of the left knee.  I do feel that would secondary to patient's large thigh to calf ratio custom knee brace is necessary and will be ordered.  I would like this with a patellar stabilization.  Think that this will be very helpful to keep patient from any surgical intervention.  We discussed icing regimen and home exercises.  We discussed which activities to do which wants to avoid.  Patient will increase activity slowly over the course the next several days.  Follow-up with me again 6 weeks.  Worsening symptoms consider injections.

## 2017-01-13 NOTE — Patient Instructions (Signed)
Good to see you  We will get you going on the brace pennsaid pinkie amount topically 2 times daily as needed.  Ice 20 minutes 2 times daily. Usually after activity and before bed. Keep it up and we could do PT  I think you would benefit from viscosupplementation. I would ask you to check with your insurance on coverage. 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

## 2017-01-13 NOTE — Progress Notes (Signed)
[name] [place],  [address] Phone: [phone] Subjective:    I'm seeing this patient by the request  of:    CC: Bilateral knee pain  [id number]  Megan Parrish is a 61 y.o. female coming in with complaint of bilateral knee pain.  Patient has severe osteoarthritic changes.  Patient had injections 1 month ago.  Patient was to do conservative therapy.  Patient has done physical therapy and all other conservative therapy previously.  Patient states left knee still gives some discomfort especially going up and down stairs with some mild irritability.  Patient states that the right knee seems to be feeling significantly better.       Past Medical History:  Diagnosis Date  . ALLERGIC RHINITIS   . Anxiety state, unspecified   . ASTHMA   . 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 Factor V deficiency (HCC)    chronic anticoag   . Fibroid   . GERD   . Palpitations   . Plantar fasciitis 2017  . THYROID NODULE 08/2008   Past Surgical History:  Procedure Laterality Date  . NO PAST SURGERIES    . perianal cyst removal  2001   Social History   Socioeconomic History  . Marital status: Single    Spouse name: Not on file  . Number of children: Not on file  . Years of education: Not on file  . Highest education level: Not on file  Social Needs  . Financial resource strain: Not on file  . Food insecurity - worry: Not on file  . Food insecurity - inability: Not on file  . Transportation needs - medical: Not on file  . Transportation needs - non-medical: Not on file  Occupational History  . Not on file  Tobacco Use  . Smoking status: Former Smoker    Packs/day: 1.00    Years: 30.00    Pack years: 30.00    Types: Cigarettes    Last attempt to quit: 04/13/2008    Years since quitting: 8.7  . Smokeless tobacco: Never Used  . Tobacco comment: single, lives alone 1 dog and 3 cats. 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 Alcohol use: Yes    Alcohol/week: 0.0 oz    Comment: 1 drink per month  . Drug use: No  . Sexual activity: No    Partners: Male    Birth control/protection: Abstinence  Other Topics Concern  . Not on file  Social History Narrative  . Not on file   Allergies  Allergen Reactions  . Codeine Nausea And Vomiting   Family History  Problem Relation Age of Onset  . Dementia Father   . Hypertension Father   . Hypertension Mother   . Thyroid disease Mother        parathyroid cyst removed  . Arthritis Other        Parent  . Uterine cancer Other        grandparent  . Ovarian cancer Maternal Grandmother      Past medical history, social, surgical and family history all reviewed in electronic medical record.  No pertanent information unless stated regarding to the chief complaint.   Review of Systems:Review of systems updated and as accurate as of 01/13/17  No headache, visual changes, nausea, vomiting, diarrhea, constipation, dizziness, abdominal pain, skin rash, fevers, chills, night sweats,  weight loss, swollen lymph nodes, body aches, joint swelling, muscle aches, chest pain, shortness of breath, mood changes.   Objective  There were no vitals taken for this visit. Systems examined below as of 01/13/17   General: No apparent distress alert and oriented x3 mood and affect normal, dressed appropriately.  HEENT: Pupils equal, extraocular movements intact  Respiratory: Patient's speak in full sentences and does not appear short of breath  Cardiovascular: No lower extremity edema, non tender, no erythema  Skin: Warm dry intact with no signs of infection or rash on extremities or on axial skeleton.  Abdomen: Soft nontender  Neuro: Cranial nerves II through XII are intact, neurovascularly intact in all extremities with 2+ DTRs and 2+ pulses.  Lymph: No lymphadenopathy of posterior or anterior cervical chain or  axillae bilaterally.  Gait mild antalgic gait MSK:  Non tender with full range of motion and good stability and symmetric strength and tone of shoulders, elbows, wrist, hip,and ankles bilaterally.  Knee: Bilateral valgus deformity noted. Large thigh to calf ratio.  Tender to palpation over medial and PF joint line.  Left greater than right ROM full in flexion and extension and lower leg rotation. instability with valgus force.  painful patellar compression.  Left greater than right Patellar glide with moderate crepitus.  Left greater than right Patellar and quadriceps tendons unremarkable. Hamstring and quadriceps strength is normal.     Impression and Recommendations:     This case required medical decision making of moderate complexity.      Note: This dictation was prepared with Dragon dictation along with smaller phrase technology. Any transcriptional errors that result from this process are unintentional.

## 2017-01-16 ENCOUNTER — Other Ambulatory Visit: Payer: 59

## 2017-01-16 ENCOUNTER — Other Ambulatory Visit: Payer: Self-pay

## 2017-01-16 DIAGNOSIS — E559 Vitamin D deficiency, unspecified: Secondary | ICD-10-CM

## 2017-01-17 LAB — VITAMIN D 25 HYDROXY (VIT D DEFICIENCY, FRACTURES): VIT D 25 HYDROXY: 29.1 ng/mL — AB (ref 30.0–100.0)

## 2017-01-21 ENCOUNTER — Telehealth: Payer: Self-pay

## 2017-01-21 NOTE — Telephone Encounter (Signed)
-----   Message from Regina Eck, CNM sent at 01/21/2017  7:46 AM EST ----- Notify patient that Vitamin D is still borderline at 29.1. Is she taking this daily? If not needs to be consistent to bring to normal level. If taking daily would increase to 2000 IU D3 daily for 8 weeks and then return to 1000 IU D3 daily. This should maintain normal level and recheck with aex.

## 2017-01-21 NOTE — Telephone Encounter (Signed)
Patient notified of results. See lab 

## 2017-01-21 NOTE — Telephone Encounter (Signed)
lmtcb

## 2017-01-23 ENCOUNTER — Ambulatory Visit (INDEPENDENT_AMBULATORY_CARE_PROVIDER_SITE_OTHER): Payer: 59 | Admitting: General Practice

## 2017-01-23 DIAGNOSIS — Z7901 Long term (current) use of anticoagulants: Secondary | ICD-10-CM

## 2017-01-23 LAB — POCT INR: INR: 1.8

## 2017-01-23 NOTE — Patient Instructions (Addendum)
Pre visit review using our clinic review tool, if applicable. No additional management support is needed unless otherwise documented below in the visit note.  Take 2 1/2 tablets today and then continue to take 10 mg all days except 5 mg on Sunday/Tuesday/Thursday.  Re-check in 4 weeks.

## 2017-01-30 ENCOUNTER — Ambulatory Visit: Payer: 59

## 2017-02-20 ENCOUNTER — Ambulatory Visit (INDEPENDENT_AMBULATORY_CARE_PROVIDER_SITE_OTHER): Payer: 59 | Admitting: General Practice

## 2017-02-20 ENCOUNTER — Ambulatory Visit (INDEPENDENT_AMBULATORY_CARE_PROVIDER_SITE_OTHER): Payer: 59 | Admitting: Internal Medicine

## 2017-02-20 ENCOUNTER — Other Ambulatory Visit (INDEPENDENT_AMBULATORY_CARE_PROVIDER_SITE_OTHER): Payer: 59

## 2017-02-20 ENCOUNTER — Encounter: Payer: Self-pay | Admitting: Internal Medicine

## 2017-02-20 ENCOUNTER — Ambulatory Visit: Payer: 59

## 2017-02-20 VITALS — BP 110/70 | HR 98 | Temp 98.0°F | Ht 69.0 in | Wt 248.0 lb

## 2017-02-20 DIAGNOSIS — Z1211 Encounter for screening for malignant neoplasm of colon: Secondary | ICD-10-CM

## 2017-02-20 DIAGNOSIS — IMO0001 Reserved for inherently not codable concepts without codable children: Secondary | ICD-10-CM

## 2017-02-20 DIAGNOSIS — I82509 Chronic embolism and thrombosis of unspecified deep veins of unspecified lower extremity: Secondary | ICD-10-CM | POA: Diagnosis not present

## 2017-02-20 DIAGNOSIS — Z7901 Long term (current) use of anticoagulants: Secondary | ICD-10-CM | POA: Diagnosis not present

## 2017-02-20 DIAGNOSIS — Z0000 Encounter for general adult medical examination without abnormal findings: Secondary | ICD-10-CM | POA: Diagnosis not present

## 2017-02-20 DIAGNOSIS — K219 Gastro-esophageal reflux disease without esophagitis: Secondary | ICD-10-CM

## 2017-02-20 DIAGNOSIS — J4520 Mild intermittent asthma, uncomplicated: Secondary | ICD-10-CM

## 2017-02-20 DIAGNOSIS — E785 Hyperlipidemia, unspecified: Secondary | ICD-10-CM

## 2017-02-20 LAB — COMPREHENSIVE METABOLIC PANEL
ALT: 20 U/L (ref 0–35)
AST: 17 U/L (ref 0–37)
Albumin: 4.3 g/dL (ref 3.5–5.2)
Alkaline Phosphatase: 84 U/L (ref 39–117)
BILIRUBIN TOTAL: 0.5 mg/dL (ref 0.2–1.2)
BUN: 14 mg/dL (ref 6–23)
CO2: 31 mEq/L (ref 19–32)
CREATININE: 0.88 mg/dL (ref 0.40–1.20)
Calcium: 9.4 mg/dL (ref 8.4–10.5)
Chloride: 101 mEq/L (ref 96–112)
GFR: 69.55 mL/min (ref >60.00)
GLUCOSE: 115 mg/dL — AB (ref 70–99)
POTASSIUM: 4.2 meq/L (ref 3.5–5.1)
SODIUM: 140 meq/L (ref 135–145)
TOTAL PROTEIN: 7.1 g/dL (ref 6.0–8.3)

## 2017-02-20 LAB — CBC
HEMATOCRIT: 43.8 % (ref 36.0–46.0)
Hemoglobin: 14.7 g/dL (ref 12.0–15.0)
MCHC: 33.4 g/dL (ref 30.0–36.0)
MCV: 90.8 fl (ref 78.0–100.0)
Platelets: 239 10*3/uL (ref 150.0–400.0)
RBC: 4.83 Mil/uL (ref 3.87–5.11)
RDW: 13.5 % (ref 11.5–15.5)
WBC: 9 10*3/uL (ref 4.0–10.5)

## 2017-02-20 LAB — LIPID PANEL
Cholesterol: 255 mg/dL — ABNORMAL HIGH (ref 0–200)
HDL: 56.3 mg/dL (ref >39.00)
NonHDL: 198.88
Total CHOL/HDL Ratio: 5
Triglycerides: 264 mg/dL — ABNORMAL HIGH (ref 0.0–149.0)
VLDL: 52.8 mg/dL — ABNORMAL HIGH (ref 0.0–40.0)

## 2017-02-20 LAB — LDL CHOLESTEROL, DIRECT: LDL DIRECT: 162 mg/dL

## 2017-02-20 LAB — POCT INR: INR: 2.2

## 2017-02-20 LAB — HEMOGLOBIN A1C: HEMOGLOBIN A1C: 6.1 % (ref 4.6–6.5)

## 2017-02-20 NOTE — Assessment & Plan Note (Signed)
Using advair prn and albuterol prn and no flare today.

## 2017-02-20 NOTE — Progress Notes (Signed)
   Subjective:    Patient ID: Megan Parrish, female    DOB: 09-09-1955, 62 y.o.   MRN: [id number]  HPI The patient is a 62 YO female coming in for physical.  PMH, [place], social history reviewed and updated  Review of Systems  Constitutional: Negative.   HENT: Negative.   Eyes: Negative.   Respiratory: Negative for cough, chest tightness and shortness of breath.   Cardiovascular: Negative for chest pain, palpitations and leg swelling.  Gastrointestinal: Negative for abdominal distention, abdominal pain, constipation, diarrhea, nausea and vomiting.  Musculoskeletal: Negative.   Skin: Negative.   Neurological: Negative.   Psychiatric/Behavioral: Negative.       Objective:   Physical Exam  Constitutional: She is oriented to person, place, and time. She appears well-developed and well-nourished.  HENT:  Head: Normocephalic and atraumatic.  Eyes: EOM are normal.  Neck: Normal range of motion.  Cardiovascular: Normal rate and regular rhythm.  Pulmonary/Chest: Effort normal and breath sounds normal. No respiratory distress. She has no wheezes. She has no rales.  Abdominal: Soft. Bowel sounds are normal. She exhibits no distension. There is no tenderness. There is no rebound.  Musculoskeletal: She exhibits no edema.  Neurological: She is alert and oriented to person, place, and time. Coordination normal.  Skin: Skin is warm and dry.  Psychiatric: She has a normal mood and affect.   Vitals:   02/20/17 1421  BP: 110/70  Pulse: 98  Temp: 98 F (36.7 C)  TempSrc: Oral  SpO2: 99%  Weight: 248 lb (112.5 kg)  Height: 5\' 9"  (1.753 m)      Assessment & Plan:

## 2017-02-20 NOTE — Assessment & Plan Note (Signed)
Taking nexium daily and gets symptoms with missing doses. Continue.

## 2017-02-20 NOTE — Patient Instructions (Signed)

## 2017-02-20 NOTE — Assessment & Plan Note (Signed)
Ordered cologuard today. Checking labs. Reminded about mammogram. Flu and tetanus up to date. Counseled about shingrix and she declines today. Counseled about sun safety and mole surveillance. Given screening recommendations.

## 2017-02-20 NOTE — Patient Instructions (Addendum)
Pre visit review using our clinic review tool, if applicable. No additional management support is needed unless otherwise documented below in the visit note.  Continue to take 10 mg all days except 5 mg on Sunday/Tuesday/Thursday.  Re-check in 6 weeks.

## 2017-02-20 NOTE — Assessment & Plan Note (Signed)
Checking lipid panel and adjust as needed.  

## 2017-02-20 NOTE — Assessment & Plan Note (Signed)
Continue on coumadin daily and follows with coumadin clinic for dosing.

## 2017-02-20 NOTE — Progress Notes (Signed)
Medical treatment/procedure(s) were performed by non-physician practitioner and as supervising physician I was immediately available for consultation/collaboration. I agree with above. Elizabeth A Crawford, MD  

## 2017-02-24 NOTE — Progress Notes (Signed)
[name] [address], [place] [address] Phone: [phone] Subjective:     CC: Bilateral knee pain follow-up  URK:[id number]  Megan Parrish is a 62 y.o. female coming in with complaint of bilateral knee pain.  Found to have degenerative arthritis of the knees bilaterally.  Failed all conservative therapy and was given Visco supplementation last injection January 13, 2017.  Was getting custom brace.  Patient states doing better overall.  Patient is we are not having any significant pain.  Patient has made good progress.  Working on a regular basis.  States that the pain is 70% better.       Past Medical History:  Diagnosis Date  . ALLERGIC RHINITIS   . Anxiety state, unspecified   . ASTHMA   . 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 Factor V deficiency (HCC)    chronic anticoag   . Fibroid   . GERD   . Palpitations   . Plantar fasciitis 2017  . THYROID NODULE 08/2008   Past Surgical History:  Procedure Laterality Date  . NO PAST SURGERIES    . perianal cyst removal  2001   Social History   Socioeconomic History  . Marital status: Single    Spouse name: None  . Number of children: None  . Years of education: None  . Highest education level: None  Social Needs  . Financial resource strain: None  . Food insecurity - worry: None  . Food insecurity - inability: None  . Transportation needs - medical: None  . Transportation needs - non-medical: None  Occupational History  . None  Tobacco Use  . Smoking status: Former Smoker    Packs/day: 1.00    Years: 30.00    Pack years: 30.00    Types: Cigarettes    Last attempt to quit: 04/13/2008    Years since quitting: 8.8  . Smokeless tobacco: Never Used  . Tobacco comment: single, lives alone 1 dog and 3 cats. 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 Alcohol use: Yes    Alcohol/week: 0.0 oz   Comment: 1 drink per month  . Drug use: No  . Sexual activity: No    Partners: Male    Birth control/protection: Abstinence  Other Topics Concern  . None  Social History Narrative  . None   Allergies  Allergen Reactions  . Codeine Nausea And Vomiting   Family History  Problem Relation Age of Onset  . Dementia Father   . Hypertension Father   . Hypertension Mother   . Thyroid disease Mother        parathyroid cyst removed  . Endocrine tumor Sister   . Leukemia Sister   . Arthritis Other        Parent  . Uterine cancer Other        grandparent  . Ovarian cancer Maternal Grandmother      Past medical history, social, surgical and family history all reviewed in electronic medical record.  No pertanent information unless stated regarding to the chief complaint.   Review of Systems:Review of systems updated and as accurate as of 02/25/17  No headache, visual changes, nausea, vomiting, diarrhea, constipation, dizziness, abdominal pain, skin rash, fevers, chills, night sweats, weight loss, swollen lymph nodes, body aches, joint swelling, muscle aches, chest pain, shortness of breath, mood changes.   Objective  Blood  pressure (!) 146/70, pulse 89, height 5\' 8"  (1.727 m), weight 248 lb (112.5 kg), SpO2 97 %. Systems examined below as of 02/25/17   General: No apparent distress alert and oriented x3 mood and affect normal, dressed appropriately.  HEENT: Pupils equal, extraocular movements intact  Respiratory: Patient's speak in full sentences and does not appear short of breath  Cardiovascular: No lower extremity edema, non tender, no erythema  Skin: Warm dry intact with no signs of infection or rash on extremities or on axial skeleton.  Abdomen: Soft nontender  Neuro: Cranial nerves II through XII are intact, neurovascularly intact in all extremities with 2+ DTRs and 2+ pulses.  Lymph: No lymphadenopathy of posterior or anterior cervical chain or axillae bilaterally.  Gait  normal with good balance and coordination.  MSK:  Non tender with full range of motion and good stability and symmetric strength and tone of shoulders, elbows, wrist, hip and ankles bilaterally.  Knee: Bilateral valgus deformity noted.  Abnormal thigh to calf ratio.  Tender to palpation over medial and PF joint line.  ROM full in flexion and extension and lower leg rotation. instability with valgus force.  Mild painful patellar compression. Patellar glide with mild crepitus. Patellar and quadriceps tendons unremarkable. Hamstring and quadriceps strength is normal.    Impression and Recommendations:     This case required medical decision making of moderate complexity.      Note: This dictation was prepared with Dragon dictation along with smaller phrase technology. Any transcriptional errors that result from this process are unintentional.

## 2017-02-25 ENCOUNTER — Encounter: Payer: Self-pay | Admitting: Family Medicine

## 2017-02-25 ENCOUNTER — Ambulatory Visit (INDEPENDENT_AMBULATORY_CARE_PROVIDER_SITE_OTHER): Payer: 59 | Admitting: Family Medicine

## 2017-02-25 DIAGNOSIS — M170 Bilateral primary osteoarthritis of knee: Secondary | ICD-10-CM | POA: Diagnosis not present

## 2017-02-25 NOTE — Patient Instructions (Signed)
Good to see you  Ice 20 minutes 2 times daily. Usually after activity and before bed. Ice after Zumba! Exercises 3 times a week. Continue the brace with a lot of activity  See me again when you need me [phone]

## 2017-02-25 NOTE — Assessment & Plan Note (Signed)
Doing well at this time.  Do not feel that another injection is necessary.  We discussed icing regimen and home exercises.  Patient will follow up with me as needed

## 2017-02-26 ENCOUNTER — Encounter: Payer: 59 | Admitting: Internal Medicine

## 2017-03-02 LAB — COLOGUARD: Cologuard: NEGATIVE

## 2017-03-10 NOTE — Progress Notes (Signed)
Abstracted and sent to scan  

## 2017-04-03 ENCOUNTER — Ambulatory Visit (INDEPENDENT_AMBULATORY_CARE_PROVIDER_SITE_OTHER): Payer: 59 | Admitting: General Practice

## 2017-04-03 DIAGNOSIS — Z7901 Long term (current) use of anticoagulants: Secondary | ICD-10-CM | POA: Diagnosis not present

## 2017-04-03 LAB — POCT INR: INR: 2.5

## 2017-04-03 NOTE — Patient Instructions (Addendum)
Pre visit review using our clinic review tool, if applicable. No additional management support is needed unless otherwise documented below in the visit note.  Continue to take 10 mg all days except 5 mg on Sunday/Tuesday/Thursday.  Re-check in 6 weeks.

## 2017-04-03 NOTE — Progress Notes (Signed)
Medical treatment/procedure(s) were performed by non-physician practitioner and as supervising physician I was immediately available for consultation/collaboration. I agree with above. Maxamilian Amadon A Laiah Pouncey, MD  

## 2017-04-07 ENCOUNTER — Telehealth: Payer: Self-pay | Admitting: *Deleted

## 2017-04-07 NOTE — Telephone Encounter (Signed)
Patient informed of negative results and stated understanding  

## 2017-04-07 NOTE — Telephone Encounter (Signed)
Received CRM below.. Will forward to MD assistant to contact pt concerning color guard.Megan Parrish  Copied from [place] [id number]. Topic: Quick Communication - Lab Results >> Apr 07, 2017  1:24 PM Carissa, Musick wrote: Pt is needing to talk with someone about lab results of color guard  Best number [phone] ok to leave a message if necessary

## 2017-04-15 NOTE — Progress Notes (Signed)
[facility] [address] [place], [place] [address] Phone: [phone] Subjective:    I'm seeing this patient by the request  of:    CC: Bilateral knee pain  BMW:[id number]  Megan Parrish is a 62 y.o. female coming in with complaint of bilateral knee pain.  Severe arthritis of the knees.  Patient was last given injection was December 15, 2016.  Patient was continuing on conservative therapy.  Patient states that her pain increased a couple of weeks ago. Her right knee was catching but that has dissipated.       Past Medical History:  Diagnosis Date  . ALLERGIC RHINITIS   . Anxiety state, unspecified   . ASTHMA   . 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 Factor V deficiency (HCC)    chronic anticoag   . Fibroid   . GERD   . Palpitations   . Plantar fasciitis 2017  . THYROID NODULE 08/2008   Past Surgical History:  Procedure Laterality Date  . NO PAST SURGERIES    . perianal cyst removal  2001   Social History   Socioeconomic History  . Marital status: Single    Spouse name: Not on file  . Number of children: Not on file  . Years of education: Not on file  . Highest education level: Not on file  Occupational History  . Not on file  Social Needs  . Financial resource strain: Not on file  . Food insecurity:    Worry: Not on file    Inability: Not on file  . Transportation needs:    Medical: Not on file    Non-medical: Not on file  Tobacco Use  . Smoking status: Former Smoker    Packs/day: 1.00    Years: 30.00    Pack years: 30.00    Types: Cigarettes    Last attempt to quit: 04/13/2008    Years since quitting: 9.0  . Smokeless tobacco: Never Used  . Tobacco comment: single, lives alone 1 dog and 3 cats. 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 Alcohol use: Yes    Alcohol/week: 0.0 oz    Comment: 1 drink per month  . Drug use: No  . Sexual  activity: Never    Partners: Male    Birth control/protection: Abstinence  Lifestyle  . Physical activity:    Days per week: Not on file    Minutes per session: Not on file  . Stress: Not on file  Relationships  . Social connections:    Talks on phone: Not on file    Gets together: Not on file    Attends religious service: Not on file    Active member of club or organization: Not on file    Attends meetings of clubs or organizations: Not on file    Relationship status: Not on file  Other Topics Concern  . Not on file  Social History Narrative  . Not on file   Allergies  Allergen Reactions  . Codeine Nausea And Vomiting   Family History  Problem Relation Age of Onset  . Dementia Father   . Hypertension Father   . Hypertension Mother   . Thyroid disease Mother        parathyroid cyst removed  . Endocrine tumor Sister   . Leukemia Sister   . Arthritis Other  Parent  . Uterine cancer Other        grandparent  . Ovarian cancer Maternal Grandmother      Past medical history, social, surgical and family history all reviewed in electronic medical record.  No pertanent information unless stated regarding to the chief complaint.   Review of Systems:Review of systems updated and as accurate as of 04/16/17  No headache, visual changes, nausea, vomiting, diarrhea, constipation, dizziness, abdominal pain, skin rash, fevers, chills, night sweats, weight loss, swollen lymph nodes,  chest pain, shortness of breath, mood changes.  Positive muscle aches and joint swelling  Objective  Blood pressure 110/74, pulse 80, height 5\' 8"  (1.727 m), weight 249 lb (112.9 kg), SpO2 97 %. Systems examined below as of 04/16/17   General: No apparent distress alert and oriented x3 mood and affect normal, dressed appropriately.  HEENT: Pupils equal, extraocular movements intact  Respiratory: Patient's speak in full sentences and does not appear short of breath  Cardiovascular: No lower  extremity edema, non tender, no erythema  Skin: Warm dry intact with no signs of infection or rash on extremities or on axial skeleton.  Abdomen: Soft nontender  Neuro: Cranial nerves II through XII are intact, neurovascularly intact in all extremities with 2+ DTRs and 2+ pulses.  Lymph: No lymphadenopathy of posterior or anterior cervical chain or axillae bilaterally.  Gait antalgic MSK: Mild tender with full range of motion and good stability and symmetric strength and tone of shoulders, elbows, wrist, hip and ankles bilaterally.  Knee: Bilateral valgus deformity noted. Large thigh to calf ratio.  Tender to palpation over medial and PF joint line.  ROM full in flexion and extension and lower leg rotation. instability with valgus force.  painful patellar compression. Patellar glide with moderate crepitus. Patellar and quadriceps tendons unremarkable. Hamstring and quadriceps strength is normal.  After informed written and verbal consent, patient was seated on exam table. Right knee was prepped with alcohol swab and utilizing anterolateral approach, patient's right knee space was injected with 4:1  marcaine 0.5%: Kenalog 40mg /dL. Patient tolerated the procedure well without immediate complications.  After informed written and verbal consent, patient was seated on exam table. Left knee was prepped with alcohol swab and utilizing anterolateral approach, patient's left knee space was injected with 4:1  marcaine 0.5%: Kenalog 40mg /dL. Patient tolerated the procedure well without immediate complications.    Impression and Recommendations:     This case required medical decision making of moderate complexity.      Note: This dictation was prepared with Dragon dictation along with smaller phrase technology. Any transcriptional errors that result from this process are unintentional.

## 2017-04-16 ENCOUNTER — Ambulatory Visit (INDEPENDENT_AMBULATORY_CARE_PROVIDER_SITE_OTHER): Payer: 59 | Admitting: Family Medicine

## 2017-04-16 ENCOUNTER — Encounter: Payer: Self-pay | Admitting: Family Medicine

## 2017-04-16 DIAGNOSIS — M170 Bilateral primary osteoarthritis of knee: Secondary | ICD-10-CM

## 2017-04-16 NOTE — Patient Instructions (Signed)
Good to see you  Send message in 1 month and if needed we have those other injections Otherwise see me when you need me

## 2017-04-16 NOTE — Assessment & Plan Note (Signed)
Bilateral injection given today.  Discussed icing regimen and home exercises.  Discussed which activities to do which wants to avoid.  Discussed topical anti-inflammatories.  Follow-up again 4 weeks of worsening pain and consider Visco supplementation.  Otherwise follow-up in 3 months

## 2017-05-22 ENCOUNTER — Other Ambulatory Visit: Payer: Self-pay | Admitting: Internal Medicine

## 2017-05-22 ENCOUNTER — Ambulatory Visit (INDEPENDENT_AMBULATORY_CARE_PROVIDER_SITE_OTHER): Payer: 59 | Admitting: General Practice

## 2017-05-22 DIAGNOSIS — Z7901 Long term (current) use of anticoagulants: Secondary | ICD-10-CM

## 2017-05-22 LAB — POCT INR: INR: 2.1

## 2017-05-22 NOTE — Progress Notes (Signed)
Medical treatment/procedure(s) were performed by non-physician practitioner and as supervising physician I was immediately available for consultation/collaboration. I agree with above. Emmarie Sannes A Kalijah Zeiss, MD  

## 2017-05-22 NOTE — Patient Instructions (Addendum)
Pre visit review using our clinic review tool, if applicable. No additional management support is needed unless otherwise documented below in the visit note.  Continue to take 10 mg all days except 5 mg on Sunday/Tuesday/Thursday.  Re-check in 8 weeks per patient request.

## 2017-05-25 ENCOUNTER — Other Ambulatory Visit: Payer: Self-pay | Admitting: General Practice

## 2017-05-25 MED ORDER — WARFARIN SODIUM 5 MG PO TABS: ORAL_TABLET | ORAL | 1 refills | Status: DC

## 2017-07-17 ENCOUNTER — Ambulatory Visit (INDEPENDENT_AMBULATORY_CARE_PROVIDER_SITE_OTHER): Payer: 59 | Admitting: General Practice

## 2017-07-17 DIAGNOSIS — Z7901 Long term (current) use of anticoagulants: Secondary | ICD-10-CM

## 2017-07-17 LAB — POCT INR: INR: 1.9 — AB (ref 2.0–3.0)

## 2017-07-17 NOTE — Patient Instructions (Addendum)
Pre visit review using our clinic review tool, if applicable. No additional management support is needed unless otherwise documented below in the visit note.  Take 3 tablets today and then continue to take 10 mg all days except 5 mg on Sunday/Tuesday/Thursday.  Re-check in 8 weeks at patient request.

## 2017-07-17 NOTE — Progress Notes (Signed)
Medical treatment/procedure(s) were performed by non-physician practitioner and as supervising physician I was immediately available for consultation/collaboration. I agree with above. Elizabeth A Crawford, MD  

## 2017-07-20 ENCOUNTER — Ambulatory Visit: Payer: 59 | Admitting: Nurse Practitioner

## 2017-09-04 ENCOUNTER — Ambulatory Visit (INDEPENDENT_AMBULATORY_CARE_PROVIDER_SITE_OTHER): Payer: 59 | Admitting: General Practice

## 2017-09-04 DIAGNOSIS — Z7901 Long term (current) use of anticoagulants: Secondary | ICD-10-CM

## 2017-09-04 LAB — POCT INR: INR: 2.5 (ref 2.0–3.0)

## 2017-09-04 NOTE — Progress Notes (Signed)
Medical treatment/procedure(s) were performed by non-physician practitioner and as supervising physician I was immediately available for consultation/collaboration. I agree with above. Armen Waring A Angeliyah Kirkey, MD  

## 2017-09-04 NOTE — Patient Instructions (Addendum)
Pre visit review using our clinic review tool, if applicable. No additional management support is needed unless otherwise documented below in the visit note.  Continue to take 10 mg all days except 5 mg on Sunday/Tuesday/Thursday.  Re-check in 8 weeks at patient request.   

## 2017-10-30 ENCOUNTER — Ambulatory Visit (INDEPENDENT_AMBULATORY_CARE_PROVIDER_SITE_OTHER): Payer: 59 | Admitting: General Practice

## 2017-10-30 DIAGNOSIS — Z7901 Long term (current) use of anticoagulants: Secondary | ICD-10-CM | POA: Diagnosis not present

## 2017-10-30 LAB — POCT INR: INR: 2.4 (ref 2.0–3.0)

## 2017-10-30 NOTE — Patient Instructions (Addendum)
Pre visit review using our clinic review tool, if applicable. No additional management support is needed unless otherwise documented below in the visit note.  Continue to take 10 mg all days except 5 mg on Sunday/Tuesday/Thursday.  Re-check in 8 weeks at patient request.   

## 2017-10-30 NOTE — Progress Notes (Signed)
Medical treatment/procedure(s) were performed by non-physician practitioner and as supervising physician I was immediately available for consultation/collaboration. I agree with above. Elizabeth A Crawford, MD  

## 2017-11-19 DIAGNOSIS — M25562 Pain in left knee: Secondary | ICD-10-CM | POA: Insufficient documentation

## 2017-11-19 DIAGNOSIS — M25561 Pain in right knee: Secondary | ICD-10-CM | POA: Insufficient documentation

## 2017-12-01 ENCOUNTER — Other Ambulatory Visit: Payer: Self-pay | Admitting: Internal Medicine

## 2017-12-18 ENCOUNTER — Ambulatory Visit (INDEPENDENT_AMBULATORY_CARE_PROVIDER_SITE_OTHER): Payer: 59 | Admitting: General Practice

## 2017-12-18 DIAGNOSIS — Z7901 Long term (current) use of anticoagulants: Secondary | ICD-10-CM

## 2017-12-18 LAB — POCT INR: INR: 2.5 (ref 2.0–3.0)

## 2017-12-18 NOTE — Patient Instructions (Signed)
Pre visit review using our clinic review tool, if applicable. No additional management support is needed unless otherwise documented below in the visit note.  Continue to take 10 mg all days except 5 mg on Sunday/Tuesday/Thursday.  Re-check in 8 weeks at patient request.   

## 2017-12-18 NOTE — Progress Notes (Signed)
Medical treatment/procedure(s) were performed by non-physician practitioner and as supervising physician I was immediately available for consultation/collaboration. I agree with above. Elizabeth A Crawford, MD  

## 2018-01-14 DIAGNOSIS — M179 Osteoarthritis of knee, unspecified: Secondary | ICD-10-CM | POA: Insufficient documentation

## 2018-02-12 ENCOUNTER — Ambulatory Visit: Payer: 59

## 2018-02-26 ENCOUNTER — Ambulatory Visit (INDEPENDENT_AMBULATORY_CARE_PROVIDER_SITE_OTHER): Payer: 59 | Admitting: General Practice

## 2018-02-26 DIAGNOSIS — Z7901 Long term (current) use of anticoagulants: Secondary | ICD-10-CM | POA: Diagnosis not present

## 2018-02-26 LAB — POCT INR: INR: 2.8 (ref 2.0–3.0)

## 2018-02-26 NOTE — Progress Notes (Signed)
Medical treatment/procedure(s) were performed by non-physician practitioner and as supervising physician I was immediately available for consultation/collaboration. I agree with above. Elizabeth A Crawford, MD  

## 2018-02-26 NOTE — Patient Instructions (Addendum)
Pre visit review using our clinic review tool, if applicable. No additional management support is needed unless otherwise documented below in the visit note.  Continue to take 10 mg all days except 5 mg on Sunday/Tuesday/Thursday.  Re-check in 8 weeks at patient request.   

## 2018-04-23 ENCOUNTER — Ambulatory Visit: Payer: 59

## 2018-04-30 ENCOUNTER — Ambulatory Visit (INDEPENDENT_AMBULATORY_CARE_PROVIDER_SITE_OTHER): Payer: 59 | Admitting: General Practice

## 2018-04-30 DIAGNOSIS — Z7901 Long term (current) use of anticoagulants: Secondary | ICD-10-CM

## 2018-04-30 LAB — POCT INR: INR: 3.2 — AB (ref 2.0–3.0)

## 2018-04-30 NOTE — Progress Notes (Signed)
Medical treatment/procedure(s) were performed by non-physician practitioner and as supervising physician I was immediately available for consultation/collaboration. I agree with above. Malary Aylesworth A Nari Vannatter, MD  

## 2018-04-30 NOTE — Patient Instructions (Addendum)
Pre visit review using our clinic review tool, if applicable. No additional management support is needed unless otherwise documented below in the visit note.  Skip coumadin tomorrow (4/4) and then continue to take 10 mg all days except 5 mg on Sunday/Tuesday/Thursday.  Re-check in 8 weeks at patient request.

## 2018-05-27 ENCOUNTER — Other Ambulatory Visit: Payer: Self-pay | Admitting: Internal Medicine

## 2018-06-18 ENCOUNTER — Ambulatory Visit (INDEPENDENT_AMBULATORY_CARE_PROVIDER_SITE_OTHER): Payer: 59 | Admitting: General Practice

## 2018-06-18 ENCOUNTER — Other Ambulatory Visit: Payer: Self-pay

## 2018-06-18 DIAGNOSIS — Z7901 Long term (current) use of anticoagulants: Secondary | ICD-10-CM

## 2018-06-18 LAB — POCT INR: INR: 2.6 (ref 2.0–3.0)

## 2018-06-18 NOTE — Progress Notes (Signed)
Medical treatment/procedure(s) were performed by non-physician practitioner and as supervising physician I was immediately available for consultation/collaboration. I agree with above. Sheranda Seabrooks A Bess Saltzman, MD  

## 2018-06-18 NOTE — Patient Instructions (Signed)
Pre visit review using our clinic review tool, if applicable. No additional management support is needed unless otherwise documented below in the visit note. 

## 2018-06-25 ENCOUNTER — Ambulatory Visit: Payer: 59

## 2018-07-12 ENCOUNTER — Other Ambulatory Visit: Payer: Self-pay

## 2018-07-12 ENCOUNTER — Ambulatory Visit (INDEPENDENT_AMBULATORY_CARE_PROVIDER_SITE_OTHER): Payer: 59 | Admitting: Internal Medicine

## 2018-07-12 ENCOUNTER — Other Ambulatory Visit (INDEPENDENT_AMBULATORY_CARE_PROVIDER_SITE_OTHER): Payer: 59

## 2018-07-12 ENCOUNTER — Encounter: Payer: Self-pay | Admitting: Internal Medicine

## 2018-07-12 VITALS — BP 130/80 | HR 73 | Temp 98.2°F | Ht 68.0 in | Wt 240.0 lb

## 2018-07-12 DIAGNOSIS — Z0000 Encounter for general adult medical examination without abnormal findings: Secondary | ICD-10-CM

## 2018-07-12 DIAGNOSIS — J4520 Mild intermittent asthma, uncomplicated: Secondary | ICD-10-CM | POA: Diagnosis not present

## 2018-07-12 DIAGNOSIS — K219 Gastro-esophageal reflux disease without esophagitis: Secondary | ICD-10-CM

## 2018-07-12 DIAGNOSIS — I82509 Chronic embolism and thrombosis of unspecified deep veins of unspecified lower extremity: Secondary | ICD-10-CM | POA: Diagnosis not present

## 2018-07-12 DIAGNOSIS — M170 Bilateral primary osteoarthritis of knee: Secondary | ICD-10-CM | POA: Diagnosis not present

## 2018-07-12 DIAGNOSIS — IMO0001 Reserved for inherently not codable concepts without codable children: Secondary | ICD-10-CM

## 2018-07-12 LAB — CBC
HCT: 42.4 % (ref 36.0–46.0)
Hemoglobin: 14.1 g/dL (ref 12.0–15.0)
MCHC: 33.4 g/dL (ref 30.0–36.0)
MCV: 90.3 fl (ref 78.0–100.0)
Platelets: 215 10*3/uL (ref 150.0–400.0)
RBC: 4.69 Mil/uL (ref 3.87–5.11)
RDW: 14 % (ref 11.5–15.5)
WBC: 8.3 10*3/uL (ref 4.0–10.5)

## 2018-07-12 LAB — HEMOGLOBIN A1C: Hgb A1c MFr Bld: 6.2 % (ref 4.6–6.5)

## 2018-07-12 MED ORDER — TRIAMCINOLONE ACETONIDE 0.1 % EX CREA: 1.0000 "application " | TOPICAL_CREAM | Freq: Two times a day (BID) | CUTANEOUS | 3 refills | Status: DC

## 2018-07-12 NOTE — Progress Notes (Signed)
   Subjective:   Patient ID: Megan Parrish, female    DOB: Jul 25, 1955, 63 y.o.   MRN: [id number]  HPI The patient is a 63 YO female coming in for physical.   PMH, [place], social history reviewed and updated  Review of Systems  Constitutional: Negative.   HENT: Negative.   Eyes: Negative.   Respiratory: Negative for cough, chest tightness and shortness of breath.   Cardiovascular: Negative for chest pain, palpitations and leg swelling.  Gastrointestinal: Negative for abdominal distention, abdominal pain, constipation, diarrhea, nausea and vomiting.  Musculoskeletal: Negative.   Skin: Negative.   Neurological: Negative.   Psychiatric/Behavioral: Negative.     Objective:  Physical Exam Constitutional:      Appearance: She is well-developed. She is obese.  HENT:     Head: Normocephalic and atraumatic.  Neck:     Musculoskeletal: Normal range of motion.  Cardiovascular:     Rate and Rhythm: Normal rate and regular rhythm.  Pulmonary:     Effort: Pulmonary effort is normal. No respiratory distress.     Breath sounds: Normal breath sounds. No wheezing or rales.  Abdominal:     General: Bowel sounds are normal. There is no distension.     Palpations: Abdomen is soft.     Tenderness: There is no abdominal tenderness. There is no rebound.  Skin:    General: Skin is warm and dry.  Neurological:     Mental Status: She is alert and oriented to person, place, and time.     Coordination: Coordination normal.     Vitals:   07/12/18 1603  BP: 130/80  Pulse: 73  Temp: 98.2 F (36.8 C)  TempSrc: Oral  SpO2: 99%  Weight: 240 lb (108.9 kg)  Height: 5\' 8"  (1.727 m)    Assessment & Plan:

## 2018-07-12 NOTE — Patient Instructions (Addendum)
Think very hard about getting the flu shot this year.   Think about getting the shingles vaccine.   Health Maintenance, Female Adopting a healthy lifestyle and getting preventive care can go a long way to promote health and wellness. Talk with your health care provider about what schedule of regular examinations is right for you. This is a good chance for you to check in with your provider about disease prevention and staying healthy. In between checkups, there are plenty of things you can do on your own. Experts have done a lot of research about which lifestyle changes and preventive measures are most likely to keep you healthy. Ask your health care provider for more information. Weight and diet Eat a healthy diet  Be sure to include plenty of vegetables, fruits, low-fat dairy products, and lean protein.  Do not eat a lot of foods high in solid fats, added sugars, or salt.  Get regular exercise. This is one of the most important things you can do for your health. ? Most adults should exercise for at least 150 minutes each week. The exercise should increase your heart rate and make you sweat (moderate-intensity exercise). ? Most adults should also do strengthening exercises at least twice a week. This is in addition to the moderate-intensity exercise. Maintain a healthy weight  Body mass index (BMI) is a measurement that can be used to identify possible weight problems. It estimates body fat based on height and weight. Your health care provider can help determine your BMI and help you achieve or maintain a healthy weight.  For females 37 years of age and older: ? A BMI below 18.5 is considered underweight. ? A BMI of 18.5 to 24.9 is normal. ? A BMI of 25 to 29.9 is considered overweight. ? A BMI of 30 and above is considered obese. Watch levels of cholesterol and blood lipids  You should start having your blood tested for lipids and cholesterol at 62 years of age, then have this test  every 5 years.  You may need to have your cholesterol levels checked more often if: ? Your lipid or cholesterol levels are high. ? You are older than 62 years of age. ? You are at high risk for heart disease. Cancer screening Lung Cancer  Lung cancer screening is recommended for adults 69-51 years old who are at high risk for lung cancer because of a history of smoking.  A yearly low-dose CT scan of the lungs is recommended for people who: ? Currently smoke. ? Have quit within the past 15 years. ? Have at least a 30-pack-year history of smoking. A pack year is smoking an average of one pack of cigarettes a day for 1 year.  Yearly screening should continue until it has been 15 years since you quit.  Yearly screening should stop if you develop a health problem that would prevent you from having lung cancer treatment. Breast Cancer  Practice breast self-awareness. This means understanding how your breasts normally appear and feel.  It also means doing regular breast self-exams. Let your health care provider know about any changes, no matter how small.  If you are in your 20s or 30s, you should have a clinical breast exam (CBE) by a health care provider every 1-3 years as part of a regular health exam.  If you are 67 or older, have a CBE every year. Also consider having a breast X-ray (mammogram) every year.  If you have a family history of breast cancer,  talk to your health care provider about genetic screening.  If you are at high risk for breast cancer, talk to your health care provider about having an MRI and a mammogram every year.  Breast cancer gene (BRCA) assessment is recommended for women who have family members with BRCA-related cancers. BRCA-related cancers include: ? Breast. ? Ovarian. ? Tubal. ? Peritoneal cancers.  Results of the assessment will determine the need for genetic counseling and BRCA1 and BRCA2 testing. Cervical Cancer Your health care provider may  recommend that you be screened regularly for cancer of the pelvic organs (ovaries, uterus, and vagina). This screening involves a pelvic examination, including checking for microscopic changes to the surface of your cervix (Pap test). You may be encouraged to have this screening done every 3 years, beginning at age 64.  For women ages 62-65, health care providers may recommend pelvic exams and Pap testing every 3 years, or they may recommend the Pap and pelvic exam, combined with testing for human papilloma virus (HPV), every 5 years. Some types of HPV increase your risk of cervical cancer. Testing for HPV may also be done on women of any age with unclear Pap test results.  Other health care providers may not recommend any screening for nonpregnant women who are considered low risk for pelvic cancer and who do not have symptoms. Ask your health care provider if a screening pelvic exam is right for you.  If you have had past treatment for cervical cancer or a condition that could lead to cancer, you need Pap tests and screening for cancer for at least 20 years after your treatment. If Pap tests have been discontinued, your risk factors (such as having a new sexual partner) need to be reassessed to determine if screening should resume. Some women have medical problems that increase the chance of getting cervical cancer. In these cases, your health care provider may recommend more frequent screening and Pap tests. Colorectal Cancer  This type of cancer can be detected and often prevented.  Routine colorectal cancer screening usually begins at 62 years of age and continues through 62 years of age.  Your health care provider may recommend screening at an earlier age if you have risk factors for colon cancer.  Your health care provider may also recommend using home test kits to check for hidden blood in the stool.  A small camera at the end of a tube can be used to examine your colon directly  (sigmoidoscopy or colonoscopy). This is done to check for the earliest forms of colorectal cancer.  Routine screening usually begins at age 42.  Direct examination of the colon should be repeated every 5-10 years through 62 years of age. However, you may need to be screened more often if early forms of precancerous polyps or small growths are found. Skin Cancer  Check your skin from head to toe regularly.  Tell your health care provider about any new moles or changes in moles, especially if there is a change in a mole's shape or color.  Also tell your health care provider if you have a mole that is larger than the size of a pencil eraser.  Always use sunscreen. Apply sunscreen liberally and repeatedly throughout the day.  Protect yourself by wearing long sleeves, pants, a wide-brimmed hat, and sunglasses whenever you are outside. Heart disease, diabetes, and high blood pressure  High blood pressure causes heart disease and increases the risk of stroke. High blood pressure is more likely to develop  in: ? People who have blood pressure in the high end of the normal range (130-139/85-89 mm Hg). ? People who are overweight or obese. ? People who are African American.  If you are 59-34 years of age, have your blood pressure checked every 3-5 years. If you are 27 years of age or older, have your blood pressure checked every year. You should have your blood pressure measured twice-once when you are at a hospital or clinic, and once when you are not at a hospital or clinic. Record the average of the two measurements. To check your blood pressure when you are not at a hospital or clinic, you can use: ? An automated blood pressure machine at a pharmacy. ? A home blood pressure monitor.  If you are between 77 years and 14 years old, ask your health care provider if you should take aspirin to prevent strokes.  Have regular diabetes screenings. This involves taking a blood sample to check your  fasting blood sugar level. ? If you are at a normal weight and have a low risk for diabetes, have this test once every three years after 62 years of age. ? If you are overweight and have a high risk for diabetes, consider being tested at a younger age or more often. Preventing infection Hepatitis B  If you have a higher risk for hepatitis B, you should be screened for this virus. You are considered at high risk for hepatitis B if: ? You were born in a country where hepatitis B is common. Ask your health care provider which countries are considered high risk. ? Your parents were born in a high-risk country, and you have not been immunized against hepatitis B (hepatitis B vaccine). ? You have HIV or AIDS. ? You use needles to inject street drugs. ? You live with someone who has hepatitis B. ? You have had sex with someone who has hepatitis B. ? You get hemodialysis treatment. ? You take certain medicines for conditions, including cancer, organ transplantation, and autoimmune conditions. Hepatitis C  Blood testing is recommended for: ? Everyone born from [birth year] through 1964. ? Anyone with known risk factors for hepatitis C. Sexually transmitted infections (STIs)  You should be screened for sexually transmitted infections (STIs) including gonorrhea and chlamydia if: ? You are sexually active and are younger than 62 years of age. ? You are older than 62 years of age and your health care provider tells you that you are at risk for this type of infection. ? Your sexual activity has changed since you were last screened and you are at an increased risk for chlamydia or gonorrhea. Ask your health care provider if you are at risk.  If you do not have HIV, but are at risk, it may be recommended that you take a prescription medicine daily to prevent HIV infection. This is called pre-exposure prophylaxis (PrEP). You are considered at risk if: ? You are sexually active and do not regularly use condoms or  know the HIV status of your partner(s). ? You take drugs by injection. ? You are sexually active with a partner who has HIV. Talk with your health care provider about whether you are at high risk of being infected with HIV. If you choose to begin PrEP, you should first be tested for HIV. You should then be tested every 3 months for as long as you are taking PrEP. Pregnancy  If you are premenopausal and you may become pregnant, ask your health care  provider about preconception counseling.  If you may become pregnant, take 400 to 800 micrograms (mcg) of folic acid every day.  If you want to prevent pregnancy, talk to your health care provider about birth control (contraception). Osteoporosis and menopause  Osteoporosis is a disease in which the bones lose minerals and strength with aging. This can result in serious bone fractures. Your risk for osteoporosis can be identified using a bone density scan.  If you are 65 years of age or older, or if you are at risk for osteoporosis and fractures, ask your health care provider if you should be screened.  Ask your health care provider whether you should take a calcium or vitamin D supplement to lower your risk for osteoporosis.  Menopause may have certain physical symptoms and risks.  Hormone replacement therapy may reduce some of these symptoms and risks. Talk to your health care provider about whether hormone replacement therapy is right for you. Follow these instructions at home:  Schedule regular health, dental, and eye exams.  Stay current with your immunizations.  Do not use any tobacco products including cigarettes, chewing tobacco, or electronic cigarettes.  If you are pregnant, do not drink alcohol.  If you are breastfeeding, limit how much and how often you drink alcohol.  Limit alcohol intake to no more than 1 drink per day for nonpregnant women. One drink equals 12 ounces of beer, 5 ounces of wine, or 1 ounces of hard  liquor.  Do not use street drugs.  Do not share needles.  Ask your health care provider for help if you need support or information about quitting drugs.  Tell your health care provider if you often feel depressed.  Tell your health care provider if you have ever been abused or do not feel safe at home. This information is not intended to replace advice given to you by your health care provider. Make sure you discuss any questions you have with your health care provider. Document Released: 07/29/2010 Document Revised: 06/21/2015 Document Reviewed: 10/17/2014 Elsevier Interactive Patient Education  2019 [employer].

## 2018-07-13 LAB — LIPID PANEL
Cholesterol: 248 mg/dL — ABNORMAL HIGH (ref 0–200)
HDL: 53.4 mg/dL (ref >39.00)
NonHDL: 194.83
Total CHOL/HDL Ratio: 5
Triglycerides: 222 mg/dL — ABNORMAL HIGH (ref 0.0–149.0)
VLDL: 44.4 mg/dL — ABNORMAL HIGH (ref 0.0–40.0)

## 2018-07-13 LAB — COMPREHENSIVE METABOLIC PANEL
ALT: 19 U/L (ref 0–35)
AST: 18 U/L (ref 0–37)
Albumin: 4.4 g/dL (ref 3.5–5.2)
Alkaline Phosphatase: 88 U/L (ref 39–117)
BUN: 21 mg/dL (ref 6–23)
CO2: 27 mEq/L (ref 19–32)
Calcium: 9.3 mg/dL (ref 8.4–10.5)
Chloride: 104 mEq/L (ref 96–112)
Creatinine, Ser: 0.92 mg/dL (ref 0.40–1.20)
GFR: 61.87 mL/min (ref >60.00)
Glucose, Bld: 101 mg/dL — ABNORMAL HIGH (ref 70–99)
Potassium: 4.3 mEq/L (ref 3.5–5.1)
Sodium: 139 mEq/L (ref 135–145)
Total Bilirubin: 0.4 mg/dL (ref 0.2–1.2)
Total Protein: 7 g/dL (ref 6.0–8.3)

## 2018-07-13 LAB — LDL CHOLESTEROL, DIRECT: Direct LDL: 169 mg/dL

## 2018-07-13 NOTE — Assessment & Plan Note (Signed)
Uses nexium daily which controls symptoms.

## 2018-07-13 NOTE — Assessment & Plan Note (Signed)
Is considering knee replacement in the near future.

## 2018-07-13 NOTE — Assessment & Plan Note (Signed)
Flu shot counseled strongly to get this season. Shingrix counseled. Tetanus up to date. Cologuard up to date. Mammogram ordered, pap smear reminded. Counseled about sun safety and mole surveillance. Counseled about the dangers of distracted driving. Given 10 year screening recommendations.

## 2018-07-13 NOTE — Assessment & Plan Note (Signed)
Needs to continue on chronic lifelong coumadin. Last INR at goal and next check July.

## 2018-07-13 NOTE — Assessment & Plan Note (Signed)
Using advair prn only. Denies using albuterol at all recently. No flare.

## 2018-07-29 ENCOUNTER — Telehealth: Payer: Self-pay | Admitting: Internal Medicine

## 2018-07-29 MED ORDER — ALBUTEROL SULFATE HFA 108 (90 BASE) MCG/ACT IN AERS: 2.0000 | INHALATION_SPRAY | RESPIRATORY_TRACT | 3 refills | Status: DC | PRN

## 2018-07-29 NOTE — Telephone Encounter (Signed)
Medication Refill - Medication: albuterol (PROAIR HFA) 108 (90 Base) MCG/ACT inhaler   Has the patient contacted their pharmacy? Yes.   (Agent: If no, request that the patient contact the pharmacy for the refill.) (Agent: If yes, when and what did the pharmacy advise?)  Preferred Pharmacy (with phone number or street name):  [id number] - [place], [place] - [address] [phone] (Phone) [phone] (Fax)     Agent: Please be advised that RX refills may take up to 3 business days. We ask that you follow-up with your pharmacy.

## 2018-08-06 ENCOUNTER — Other Ambulatory Visit: Payer: Self-pay

## 2018-08-06 ENCOUNTER — Ambulatory Visit (INDEPENDENT_AMBULATORY_CARE_PROVIDER_SITE_OTHER): Payer: 59 | Admitting: General Practice

## 2018-08-06 DIAGNOSIS — IMO0001 Reserved for inherently not codable concepts without codable children: Secondary | ICD-10-CM

## 2018-08-06 DIAGNOSIS — Z7901 Long term (current) use of anticoagulants: Secondary | ICD-10-CM | POA: Diagnosis not present

## 2018-08-06 DIAGNOSIS — I82509 Chronic embolism and thrombosis of unspecified deep veins of unspecified lower extremity: Secondary | ICD-10-CM

## 2018-08-06 LAB — POCT INR: INR: 3 (ref 2.0–3.0)

## 2018-08-06 NOTE — Patient Instructions (Addendum)
Pre visit review using our clinic review tool, if applicable. No additional management support is needed unless otherwise documented below in the visit note.  Continue to take 10 mg all days except 5 mg on Sunday/Tuesday/Thursday.  Re-check in 8 weeks at patient request.

## 2018-08-06 NOTE — Progress Notes (Signed)
Medical treatment/procedure(s) were performed by non-physician practitioner and as supervising physician I was immediately available for consultation/collaboration. I agree with above. Odaliz Mcqueary A Aryka Coonradt, MD  

## 2018-08-27 ENCOUNTER — Ambulatory Visit
Admission: RE | Admit: 2018-08-27 | Discharge: 2018-08-27 | Disposition: A | Discharge status: Home / Self Care | Payer: 59 | Source: Ambulatory Visit | Attending: Internal Medicine | Admitting: Internal Medicine

## 2018-08-27 ENCOUNTER — Other Ambulatory Visit: Payer: Self-pay

## 2018-08-27 DIAGNOSIS — Z0000 Encounter for general adult medical examination without abnormal findings: Secondary | ICD-10-CM

## 2018-08-30 ENCOUNTER — Other Ambulatory Visit: Payer: Self-pay | Admitting: Internal Medicine

## 2018-08-30 DIAGNOSIS — R928 Other abnormal and inconclusive findings on diagnostic imaging of breast: Secondary | ICD-10-CM

## 2018-09-01 ENCOUNTER — Other Ambulatory Visit: Payer: Self-pay

## 2018-09-01 ENCOUNTER — Ambulatory Visit: Payer: 59

## 2018-09-01 ENCOUNTER — Ambulatory Visit
Admission: RE | Admit: 2018-09-01 | Discharge: 2018-09-01 | Disposition: A | Discharge status: Home / Self Care | Payer: 59 | Source: Ambulatory Visit | Attending: Internal Medicine | Admitting: Internal Medicine

## 2018-09-01 DIAGNOSIS — R928 Other abnormal and inconclusive findings on diagnostic imaging of breast: Secondary | ICD-10-CM

## 2018-10-01 ENCOUNTER — Ambulatory Visit: Payer: 59

## 2018-10-08 ENCOUNTER — Other Ambulatory Visit: Payer: Self-pay

## 2018-10-08 ENCOUNTER — Ambulatory Visit (INDEPENDENT_AMBULATORY_CARE_PROVIDER_SITE_OTHER): Payer: 59 | Admitting: General Practice

## 2018-10-08 DIAGNOSIS — Z7901 Long term (current) use of anticoagulants: Secondary | ICD-10-CM | POA: Diagnosis not present

## 2018-10-08 LAB — POCT INR: INR: 2.8 (ref 2.0–3.0)

## 2018-10-08 NOTE — Patient Instructions (Addendum)
Pre visit review using our clinic review tool, if applicable. No additional management support is needed unless otherwise documented below in the visit note.  Continue to take 10 mg all days except 5 mg on Sunday/Tuesday/Thursday.  Re-check in 8 weeks at patient request.   

## 2018-11-09 ENCOUNTER — Other Ambulatory Visit: Payer: Self-pay | Admitting: Internal Medicine

## 2018-11-10 ENCOUNTER — Telehealth: Payer: Self-pay | Admitting: Internal Medicine

## 2018-11-10 NOTE — Telephone Encounter (Signed)
Pt is going to have her wisdom teeth out and she needs to know 1 when to stop coum?  2.  When to restart coum afterwards?  3. When does she need to come back in after procedure to have coum checked?

## 2018-11-11 ENCOUNTER — Other Ambulatory Visit: Payer: Self-pay

## 2018-11-11 MED ORDER — TRIAMCINOLONE ACETONIDE 0.1 % EX CREA: 1.0000 "application " | TOPICAL_CREAM | Freq: Two times a day (BID) | CUTANEOUS | 3 refills | Status: DC

## 2018-11-11 NOTE — Telephone Encounter (Signed)
LMOVM for pt to call Villa Herb, RN with a procedure date.  I can then give instructions for holding and re-starting coumadin.

## 2018-11-30 HISTORY — PX: WISDOM TOOTH EXTRACTION: SHX21

## 2018-12-01 ENCOUNTER — Other Ambulatory Visit: Payer: Self-pay

## 2018-12-01 NOTE — Progress Notes (Signed)
63 y.o. G0P0000 Single  Caucasian Fe here for annual exam. Post menopausal no HRT. Last visit here 01/21/17. Has not had vaginal bleeding since last visit. Still having occasional hot flashes, doing well with. Lichen sclerosis flares still occurring, occasionally, has Rx for management.. Sees Dr. 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 No issues other than knee issues left, planning surgery soon.   No LMP recorded (exact date). Patient is postmenopausal.          Sexually active: No.  The current method of family planning is post menopausal status.    Exercising: Yes.    walking Smoker:  no  Review of Systems  Constitutional: Negative.   HENT: Negative.   Eyes: Negative.   Respiratory: Negative.   Cardiovascular: Negative.   Gastrointestinal: Negative.   Genitourinary: Negative.   Musculoskeletal: Negative.   Skin: Negative.   Neurological: Negative.   Endo/Heme/Allergies: Negative.   Psychiatric/Behavioral: Negative.     Health Maintenance: Pap:  07-08-16 neg HPV HR neg History of Abnormal Pap: no MMG:  07/2018 & 08/2018 bilateral & rt breast category c density birads 2:neg Self Breast exams: no Colonoscopy:  cologuard neg 2019 BMD:   none TDaP:  2010 will do today Shingles: not done Pneumonia: not done Hep C and HIV: both neg 2017 Labs: Vitamin D   reports that she quit smoking about 10 years ago. Her smoking use included cigarettes. She has a 30.00 pack-year smoking history. She has never used smokeless tobacco. She reports previous alcohol use. She reports that she does not use drugs.  Past Medical History:  Diagnosis Date  . ALLERGIC RHINITIS   . Anxiety state, unspecified   . ASTHMA   . 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 Factor V deficiency (HCC)    chronic anticoag   . Fibroid   . GERD   . Palpitations   . Plantar fasciitis 2017  . THYROID NODULE 08/2008    Past Surgical History:   Procedure Laterality Date  . NO PAST SURGERIES    . perianal cyst removal  2001    Current Outpatient Medications  Medication Sig Dispense Refill  . albuterol (PROAIR HFA) 108 (90 Base) MCG/ACT inhaler Inhale 2 puffs into the lungs every 4 (four) hours as needed. 18 g 3  . amoxicillin (AMOXIL) 500 MG capsule Take 1,000 mg by mouth 2 (two) times daily.    Marland Kitchen esomeprazole (NEXIUM) 40 MG capsule Take 40 mg by mouth daily at 12 noon.    . triamcinolone cream (KENALOG) 0.1 % Apply 1 application topically 2 (two) times daily. 80 g 3  . VITAMIN D PO Take by mouth.    . warfarin (JANTOVEN) 5 MG tablet TAKE 2 TABLETS DAILY EXCEPT TAKE 1 TABLET ON SUNDAY TUESDAY AND THURSDAY OR TAKE AS DIRECTED BY ANTICOAGULATION CLINIC 142 tablet 1   No current facility-administered medications for this visit.     Family History  Problem Relation Age of Onset  . Dementia Father   . Hypertension Father   . Hypertension Mother   . Thyroid disease Mother        parathyroid cyst removed  . Endocrine tumor Sister   . Leukemia Sister   . Arthritis Other        Parent  . Uterine cancer Other        grandparent  . Ovarian cancer Maternal Grandmother     ROS:  Pertinent items are noted in HPI.  Otherwise, a comprehensive ROS was negative.  Exam:   BP 120/80   Pulse 70   Temp (!) 97.1 F (36.2 C) (Skin)   Resp 16   Ht 5\' 7"  (1.702 m)   Wt 237 lb (107.5 kg)   LMP  (Exact Date)   BMI 37.12 kg/m  Height: 5\' 7"  (170.2 cm) Ht Readings from Last 3 Encounters:  12/03/18 5\' 7"  (1.702 m)  07/12/18 5\' 8"  (1.727 m)  04/16/17 5\' 8"  (1.727 m)    General appearance: alert, cooperative and appears stated age Head: Normocephalic, without obvious abnormality, atraumatic Neck: no adenopathy, supple, symmetrical, trachea midline and thyroid normal to inspection and palpation Lungs: clear to auscultation bilaterally Breasts: normal appearance, no masses or tenderness, No nipple retraction or dimpling, No nipple  discharge or bleeding, No axillary or supraclavicular adenopathy Heart: regular rate and rhythm Abdomen: soft, non-tender; no masses,  no organomegaly Extremities: extremities normal, atraumatic, no cyanosis or edema Skin: Skin color, texture, turgor normal. No rashes or lesions Lymph nodes: Cervical, supraclavicular, and axillary nodes normal. No abnormal inguinal nodes palpated Neurologic: Grossly normal   Pelvic: External genitalia:  no lesions              Urethra:  normal appearing urethra with no masses, tenderness or lesions              Bartholin's and Skene's: normal                 Vagina: normal appearing vagina with normal color and discharge, no lesions              Cervix: no cervical motion tenderness, no lesions and normal appearance              Pap taken: No. Bimanual Exam:  Uterus:  normal size, contour, position, consistency, mobility, non-tender and anteverted              Adnexa: normal adnexa and no mass, fullness, tenderness               Rectovaginal: Confirms               Anus:  normal sphincter tone, no lesions  Chaperone present: yes  A:  Well Woman with normal exam  Post menopausal no HRT  DVT history in legs on Warfarin with MD management, also Factor V Leiden  Immunization update  Vitamin D deficicency    P:   Reviewed health and wellness pertinent to exam  Aware of need to advise if vaginal bleeding.  Discussed importance of never using estrogen due to DVT risk. She can use coconut oil or OTC moisture if  Needed. Questions addressed.  Continue follow up with MD as indicated.  TDAP requsted.  Lab: Vitamin D  Pap smear: no  counseled on breast self exam, mammography screening, feminine hygiene, menopause, BMD due next  Year, adequate intake of calcium and vitamin D, diet and exercise  return annually or prn  An After Visit Summary was printed and given to the patient.

## 2018-12-03 ENCOUNTER — Ambulatory Visit (INDEPENDENT_AMBULATORY_CARE_PROVIDER_SITE_OTHER): Payer: 59 | Admitting: Certified Nurse Midwife

## 2018-12-03 ENCOUNTER — Other Ambulatory Visit: Payer: Self-pay

## 2018-12-03 ENCOUNTER — Encounter: Payer: Self-pay | Admitting: Certified Nurse Midwife

## 2018-12-03 ENCOUNTER — Ambulatory Visit: Payer: 59

## 2018-12-03 VITALS — BP 120/80 | HR 70 | Temp 97.1°F | Resp 16 | Ht 67.0 in | Wt 237.0 lb

## 2018-12-03 DIAGNOSIS — N951 Menopausal and female climacteric states: Secondary | ICD-10-CM | POA: Diagnosis not present

## 2018-12-03 DIAGNOSIS — Z23 Encounter for immunization: Secondary | ICD-10-CM | POA: Diagnosis not present

## 2018-12-03 DIAGNOSIS — Z01419 Encounter for gynecological examination (general) (routine) without abnormal findings: Secondary | ICD-10-CM

## 2018-12-03 DIAGNOSIS — E559 Vitamin D deficiency, unspecified: Secondary | ICD-10-CM | POA: Diagnosis not present

## 2018-12-03 DIAGNOSIS — Z862 Personal history of diseases of the blood and blood-forming organs and certain disorders involving the immune mechanism: Secondary | ICD-10-CM

## 2018-12-03 DIAGNOSIS — Z86718 Personal history of other venous thrombosis and embolism: Secondary | ICD-10-CM

## 2018-12-04 LAB — VITAMIN D 25 HYDROXY (VIT D DEFICIENCY, FRACTURES): Vit D, 25-Hydroxy: 23 ng/mL — ABNORMAL LOW (ref 30.0–100.0)

## 2018-12-05 ENCOUNTER — Other Ambulatory Visit: Payer: Self-pay | Admitting: Certified Nurse Midwife

## 2018-12-05 DIAGNOSIS — E559 Vitamin D deficiency, unspecified: Secondary | ICD-10-CM

## 2018-12-10 ENCOUNTER — Other Ambulatory Visit: Payer: Self-pay

## 2018-12-10 ENCOUNTER — Ambulatory Visit (INDEPENDENT_AMBULATORY_CARE_PROVIDER_SITE_OTHER): Payer: 59 | Admitting: General Practice

## 2018-12-10 DIAGNOSIS — Z7901 Long term (current) use of anticoagulants: Secondary | ICD-10-CM

## 2018-12-10 LAB — POCT INR: INR: 2.7 (ref 2.0–3.0)

## 2018-12-10 NOTE — Progress Notes (Signed)
Medical screening examination/treatment/procedure(s) were performed by non-physician practitioner and as supervising physician I was immediately available for consultation/collaboration. I agree with above. James John, MD   

## 2018-12-10 NOTE — Patient Instructions (Signed)
Pre visit review using our clinic review tool, if applicable. No additional management support is needed unless otherwise documented below in the visit note.  Continue to take 10 mg all days except 5 mg on Sunday/Tuesday/Thursday.  Re-check after tooth extraction.

## 2018-12-15 ENCOUNTER — Telehealth: Payer: Self-pay | Admitting: General Practice

## 2018-12-15 ENCOUNTER — Other Ambulatory Visit: Payer: Self-pay | Admitting: General Practice

## 2018-12-15 DIAGNOSIS — Z7901 Long term (current) use of anticoagulants: Secondary | ICD-10-CM

## 2018-12-15 MED ORDER — ENOXAPARIN SODIUM 150 MG/ML ~~LOC~~ SOLN: 150.0000 mg | SUBCUTANEOUS | 0 refills | Status: DC

## 2018-12-15 NOTE — Telephone Encounter (Signed)
Instructions for coumadin and Lovenox bridge for dental procedure on 11/23.  Check INR on Friday 11/20.  11/20 - Hold coumadin until after procedure. 11/21 - Lovenox in the AM 11/22 - Lovenox in the AM(Take by 7 am) 11/23 - Procedure (Do not take Lovenox today) 11/24 - Lovenox in the AM and 15 mg of coumadin 11/25 - Lovenox in the AM and 15 mg of coumadin 11/26 - Lovenox in the AM and 15 mg of coumadin 11/27 - Lovenox in the AM and 10 mg of coumadin 11/28 - Stop Lovenox and resume coumadin dosage of 10 mg daily except 5 mg on Sun Tues and Thursday.  Re-check INR on Tuesday, December 1.

## 2018-12-15 NOTE — Telephone Encounter (Signed)
-----   Message from Hoyt Koch, MD sent at 12/15/2018  7:54 AM EST ----- Regarding: RE: Lovenox bridge Would recommend bridging due to multiple DVT in past.  ----- Message ----- From: Warden Fillers, RN Sent: 12/14/2018  11:22 AM EST To: Hoyt Koch, MD Subject: Lovenox bridge                                 Hi Dr. [name],  Patient is having invasive dental work on 11/23.  Oral surgeon wants INR under 2.0.  I will be checking her INR on Friday.   Depending on the value she will need to hold coumadin as many as 3 days.  Would you recommend a Lovenox bridge for her?  Please advise.  Thanks! Villa Herb, RN

## 2018-12-17 ENCOUNTER — Other Ambulatory Visit: Payer: Self-pay

## 2018-12-17 ENCOUNTER — Ambulatory Visit (INDEPENDENT_AMBULATORY_CARE_PROVIDER_SITE_OTHER): Payer: 59 | Admitting: General Practice

## 2018-12-17 DIAGNOSIS — Z7901 Long term (current) use of anticoagulants: Secondary | ICD-10-CM | POA: Diagnosis not present

## 2018-12-17 LAB — POCT INR: INR: 2.8 (ref 2.0–3.0)

## 2018-12-17 NOTE — Progress Notes (Signed)
Medical treatment/procedure(s) were performed by non-physician practitioner and as supervising physician I was immediately available for consultation/collaboration. I agree with above. Mykaela Arena A Barrett Holthaus, MD  

## 2018-12-17 NOTE — Patient Instructions (Addendum)
Pre visit review using our clinic review tool, if applicable. No additional management support is needed unless otherwise documented below in the visit note.  Please follow patient instructions!  Instructions for coumadin and Lovenox bridge for dental procedure on 11/23.  11/20 - Hold coumadin until after procedure. 11/21 - Lovenox in the AM 11/22 - Lovenox in the AM(Take by 7 am) 11/23 - Procedure (Do not take Lovenox today) 11/24 - Lovenox in the AM and 15 mg of coumadin 11/25 - Lovenox in the AM and 15 mg of coumadin 11/26 - Lovenox in the AM and 15 mg of coumadin 11/27 - Lovenox in the AM and 10 mg of coumadin 11/28 - Stop Lovenox and resume coumadin dosage of 10 mg daily except 5 mg on Sun Tues and Thursday.  Re-check INR on Tuesday, December 1.

## 2018-12-28 ENCOUNTER — Ambulatory Visit: Payer: 59

## 2018-12-31 ENCOUNTER — Other Ambulatory Visit: Payer: Self-pay

## 2018-12-31 ENCOUNTER — Ambulatory Visit (INDEPENDENT_AMBULATORY_CARE_PROVIDER_SITE_OTHER): Payer: 59 | Admitting: General Practice

## 2018-12-31 DIAGNOSIS — Z7901 Long term (current) use of anticoagulants: Secondary | ICD-10-CM | POA: Diagnosis not present

## 2018-12-31 LAB — POCT INR: INR: 2.1 (ref 2.0–3.0)

## 2018-12-31 NOTE — Patient Instructions (Addendum)
Pre visit review using our clinic review tool, if applicable. No additional management support is needed unless otherwise documented below in the visit note.  Continue to take 2 tablets daily except 1 tablet on Sun Tues and Thursday.  Re-check after knee procedure.  I will be touch with you over the next couple of weeks with Lovenox instructions.  Knee surgery scheduled for 1/4.

## 2018-12-31 NOTE — Progress Notes (Signed)
Medical screening examination/treatment/procedure(s) were performed by non-physician practitioner and as supervising physician I was immediately available for consultation/collaboration. I agree with above. Ritvik Mczeal, MD   

## 2019-01-03 ENCOUNTER — Telehealth: Payer: Self-pay | Admitting: General Practice

## 2019-01-03 ENCOUNTER — Other Ambulatory Visit: Payer: Self-pay | Admitting: General Practice

## 2019-01-03 DIAGNOSIS — Z7901 Long term (current) use of anticoagulants: Secondary | ICD-10-CM

## 2019-01-03 MED ORDER — ENOXAPARIN SODIUM 150 MG/ML ~~LOC~~ SOLN: 150.0000 mg | SUBCUTANEOUS | 0 refills | Status: DC

## 2019-01-03 NOTE — Telephone Encounter (Signed)
-----   Message from Hoyt Koch, MD sent at 01/03/2019  2:03 PM EST ----- Regarding: RE: Lovenox bridge Yes, please thanks Lysbeth Penner ----- Message ----- From: Warden Fillers, RN Sent: 01/03/2019   1:54 PM EST To: Hoyt Koch, MD Subject: Lovenox bridge                                 Dr. [name],  Patient is having knee replacement on 1/4.  She will need a Lovenox bridge.  Can I have your permission to dose and call this in please?  Please advise.  Thanks, Villa Herb, RN

## 2019-01-03 NOTE — Telephone Encounter (Signed)
Instructions for coumadin and Lovenox pre and post knee procedure on 1/4.  12/30 - Last dose of coumadin until after procedure. 12/31 - Nothing (No coumadin or Lovenox) 1/1 - Lovenox in the AM 1/2 - Lovenox in the AM 1/3 - Lovenox in the AM (Take by 7 am) 1/4 - Procedure (No Lovenox today) 1/5 - Lovenox in the AM and 3 tablets (15 mg) of coumadin 1/6 - Lovenox in the AM and 3 tablets of coumadin 1/7 - Lovenox in the AM and 3 tablets of coumadin 1/8 - Lovenox in the AM and 2 tablets of coumadin  1/9 - Stop Lovenox and resume taking coumadin, 2 tablets daily except 1 tablet on Sun Tues and Thurs.  Stop Lovenox and check INR on 1/12 at the [facility] location.

## 2019-01-05 NOTE — H&P (Signed)
TOTAL KNEE ADMISSION H&P  Patient is being admitted for left total knee arthroplasty.  Subjective:  Chief Complaint:left knee pain.  HPI: Megan Parrish, 63 y.o. female, has a history of pain and functional disability in the left knee due to arthritis and has failed non-surgical conservative treatments for greater than 12 weeks to includecorticosteriod injections, viscosupplementation injections and activity modification.  Onset of symptoms was gradual, starting 3 years ago with gradually worsening course since that time. The patient noted no past surgery on the left knee(s).  Patient currently rates pain in the left knee(s) at 5 out of 10 with activity. Patient has worsening of pain with activity and weight bearing, crepitus, joint swelling and instability.  Patient has evidence of bone-on-bone arthritis in the medial compartment with patellofemoral narrowing by imaging studies. There is no active infection.  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    Current Outpatient Medications  Medication Sig Dispense  Refill Last Dose   albuterol (PROAIR HFA) 108 (90 Base) MCG/ACT inhaler Inhale 2 puffs into the lungs every 4 (four) hours as needed. 18 g 3 Taking   amoxicillin (AMOXIL) 500 MG capsule Take 1,000 mg by mouth 2 (two) times daily.   Taking   enoxaparin (LOVENOX) 150 MG/ML injection Inject 1 mL (150 mg total) into the skin daily for 7 doses. 7 mL 0    esomeprazole (NEXIUM) 40 MG capsule Take 40 mg by mouth daily at 12 noon.   Taking   triamcinolone cream (KENALOG) 0.1 % Apply 1 application topically 2 (two) times daily. 80 g 3 Taking   VITAMIN D PO Take by mouth.   Taking   warfarin (JANTOVEN) 5 MG tablet TAKE 2 TABLETS DAILY EXCEPT TAKE 1 TABLET ON SUNDAY TUESDAY AND THURSDAY OR TAKE AS DIRECTED BY ANTICOAGULATION CLINIC 142 tablet 1 Taking   Allergies  Allergen Reactions   Codeine Nausea And Vomiting   Other Hives and Other (See Comments)    Respiratory distress    Social History   Tobacco Use   Smoking status: Former Smoker    Packs/day: 1.00    Years: 30.00    Pack years: 30.00    Types: Cigarettes    Quit date: 04/13/2008    Years since quitting: 10.7   Smokeless tobacco: Never Used   Tobacco comment: single, lives alone 1 dog and 3 cats. 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  HENT: Negative for congestion, sore throat and tinnitus.   Eyes: Negative for double vision, photophobia and pain.  Respiratory: Negative for cough, shortness of breath and  wheezing.   Cardiovascular: Negative for chest pain, palpitations and orthopnea.  Gastrointestinal: Negative for heartburn, nausea and vomiting.  Genitourinary: Negative for dysuria, frequency and urgency.  Musculoskeletal: Positive for joint pain.  Neurological: Negative for dizziness, weakness and headaches.    Objective:  Physical Exam  Well nourished and well developed.  General: Alert and oriented x3, cooperative and pleasant, no acute distress.  Head: normocephalic, atraumatic, neck supple.  Eyes: EOMI.  Respiratory: breath sounds clear in all fields, no wheezing, rales, or rhonchi. Cardiovascular: Regular rate and rhythm, no murmurs, gallops or rubs.  Abdomen: non-tender to palpation and soft, normoactive bowel sounds. Musculoskeletal:  Left Knee Exam:  No effusion present. No swelling present. The range of motion is: 5 to 125 degrees.  Minor crepitus on range of motion of the knee.  Slight medial joint line tenderness. No lateral joint line tenderness.  The knee is stable.   Calves soft and nontender. Motor function intact in LE. Strength 5/5 LE bilaterally. Neuro: Distal pulses 2+. Sensation to light touch intact in LE.  Vital signs in last 24 hours: Blood pressure: 112/80 mmHg Pulse: 76 bpm  Labs:   Estimated body mass index is 37.12 kg/m as calculated from the following:   Height as of 12/03/18: 5\' 7"  (1.702 m).   Weight as of 12/03/18: 107.5 kg.   Imaging Review Plain radiographs demonstrate severe degenerative joint disease of the left knee(s). The overall alignment isneutral. The bone quality appears to be adequate for age and reported activity level.  Assessment/Plan:  End stage arthritis, left knee   The patient history, physical examination, clinical judgment of the provider and imaging studies are consistent with end stage degenerative joint disease of the left knee(s) and total knee arthroplasty is deemed medically necessary. The treatment options  including medical management, injection therapy arthroscopy and arthroplasty were discussed at length. The risks and benefits of total knee arthroplasty were presented and reviewed. The risks due to aseptic loosening, infection, stiffness, patella tracking problems, thromboembolic complications and other imponderables were discussed. The patient acknowledged the explanation, agreed to proceed with the plan and consent was signed. Patient is being admitted for inpatient treatment for surgery, pain control, PT, OT, prophylactic antibiotics, VTE prophylaxis, progressive ambulation and ADL's and discharge planning. The patient is planning to be discharged home.  Anticipated LOS equal to or greater than 2 midnights due to - Age 79 and older with one or more of the following:  - Obesity  - Expected need for hospital services (PT, OT, Nursing) required for safe  discharge  - Anticipated need for postoperative skilled nursing care or inpatient rehab  - Active co-morbidities: DVT/VTE OR   - Unanticipated findings during/Post Surgery: None  - Patient is a high  risk of re-admission due to: None  Therapy Plans: Outpatient theray at [facility] in [place] Disposition: Home with sister-in-law Planned DVT Prophylaxis: Warfarin with Lovenox bridge (ordered through PCP) DME needed: None PCP: Dr. [name] TXA: Topical (hx of 3 DVTs) Allergies: Codeine (dizziness) Anesthesia Concerns: None BMI: 36.2  - Patient was instructed on what medications to stop prior to surgery. - Follow-up visit in 2 weeks with Dr. Wynelle Link - Begin physical therapy following surgery - Pre-operative lab work as pre-surgical testing - Prescriptions will be provided in hospital at time of discharge  Theresa Duty, PA-C Orthopedic Surgery [facility] [place]

## 2019-01-18 NOTE — Patient Instructions (Addendum)
DUE TO COVID-19 ONLY ONE VISITOR IS ALLOWED TO COME WITH YOU AND STAY IN THE WAITING ROOM ONLY DURING PRE OP AND PROCEDURE DAY OF SURGERY. THE 1 VISITOR MAY VISIT WITH YOU AFTER SURGERY IN YOUR PRIVATE ROOM DURING VISITING HOURS ONLY!  YOU NEED TO HAVE A COVID 19 TEST ON: 01/27/2019 @ 11:30 AM, THIS TEST MUST BE DONE BEFORE SURGERY, COME  [place], [address] [place] , [address].  ([place]) ONCE YOUR COVID TEST IS COMPLETED, PLEASE BEGIN THE QUARANTINE INSTRUCTIONS AS OUTLINED IN YOUR HANDOUT.                Taylore [place]ind[place]    Your procedure is scheduled on: 01/31/2019   Report to [place]  Entrance   Report to short stay at: 5:50 AM     Call this number if you have problems the morning of surgery [phone]    Remember:    [place], NO [place].   NO SOLID FOOD AFTER MIDNIGHT THE NIGHT PRIOR TO SURGERY. NOTHING BY MOUTH EXCEPT CLEAR LIQUIDS UNTIL: 5:20 AM . PLEASE FINISH ENSURE DRINK PER SURGEON ORDER  WHICH NEEDS TO BE COMPLETED AT: 5:20 AM.   CLEAR LIQUID DIET   Foods Allowed                                                                     Foods Excluded  Coffee and tea, regular and decaf                             liquids that you cannot  Plain Jell-O any favor except red or purple                                           see through such as: Fruit ices (not with fruit pulp)                                     milk, soups, orange juice  Iced Popsicles                                    All solid food Carbonated beverages, regular and diet                                    Cranberry, grape and apple juices Sports drinks like Gatorade Lightly seasoned clear broth or consume(fat free) Sugar, honey syrup  Sample Menu Breakfast                                Lunch  Supper Cranberry juice                    Beef broth                             Chicken broth Jell-O                                     Grape juice                           Apple juice Coffee or tea                        Jell-O                                      Popsicle                                                Coffee or tea                        Coffee or tea  _____________________________________________________________________         Take these medicines the morning of surgery with A SIP OF WATER: Esomeprazole. Inhaler.                                  You may not have any metal on your body including hair pins and              piercings  Do not wear jewelry, make-up, lotions, powders or perfumes, deodorant             Do not wear nail polish on your fingernails.  Do not shave  48 hours prior to surgery.              Men may shave face and neck.   Do not bring valuables to the hospital. [place].  Contacts, dentures or bridgework may not be worn into surgery.  Leave suitcase in the car. After surgery it may be brought to your room.     Patients discharged the day of surgery will not be allowed to drive home. IF YOU ARE HAVING SURGERY AND GOING HOME THE SAME DAY, YOU MUST HAVE AN ADULT TO DRIVE YOU HOME AND BE WITH YOU FOR 24 HOURS. YOU MAY GO HOME BY TAXI OR UBER OR ORTHERWISE, BUT AN ADULT MUST ACCOMPANY YOU HOME AND STAY WITH YOU FOR 24 HOURS.  Name and phone number of your driver:  Special Instructions: N/A              Please read over the following fact sheets you were given: _____________________________________________________________________   [facility] - Preparing for Surgery Before surgery, you can play an important role.  Because skin is not sterile, your skin needs to be as free of germs as possible.  You can reduce the number of germs on your skin by washing with CHG (chlorahexidine gluconate) soap before surgery.  CHG is an antiseptic cleaner which kills germs and  bonds with the skin to continue killing germs even after washing. Please DO NOT use if you have an allergy to CHG or antibacterial soaps.  If your skin becomes reddened/irritated stop using the CHG and inform your nurse when you arrive at Short Stay. Do not shave (including legs and underarms) for at least 48 hours prior to the first CHG shower.  You may shave your face/neck. Please follow these instructions carefully:  1.  Shower with CHG Soap the night before surgery and the  morning of Surgery.  2.  If you choose to wash your hair, wash your hair first as usual with your  normal  shampoo.  3.  After you shampoo, rinse your hair and body thoroughly to remove the  shampoo.                           4.  Use CHG as you would any other liquid soap.  You can apply chg directly  to the skin and wash                       Gently with a scrungie or clean washcloth.  5.  Apply the CHG Soap to your body ONLY FROM THE NECK DOWN.   Do not use on face/ open                           Wound or open sores. Avoid contact with eyes, ears mouth and genitals (private parts).                       Wash face,  Genitals (private parts) with your normal soap.             6.  Wash thoroughly, paying special attention to the area where your surgery  will be performed.  7.  Thoroughly rinse your body with warm water from the neck down.  8.  DO NOT shower/wash with your normal soap after using and rinsing off  the CHG Soap.                9.  Pat yourself dry with a clean towel.            10.  Wear clean pajamas.            11.  Place clean sheets on your bed the night of your first shower and do not  sleep with pets. Day of Surgery : Do not apply any lotions/deodorants the morning of surgery.  Please wear clean clothes to the hospital/surgery center.  FAILURE TO FOLLOW THESE INSTRUCTIONS MAY RESULT IN THE CANCELLATION OF YOUR SURGERY PATIENT SIGNATURE_________________________________  NURSE  SIGNATURE__________________________________  ________________________________________________________________________    Adam Phenix  An incentive spirometer is a tool that can help keep your lungs clear and active. This tool measures how well you are filling your lungs with each breath. Taking long deep breaths may help reverse or decrease the chance of developing breathing (pulmonary) problems (especially infection) following:  A long period of time when you are unable to move or be active. BEFORE THE PROCEDURE   If the spirometer includes an indicator to show your best effort, your nurse or respiratory therapist  will set it to a desired goal.  If possible, sit up straight or lean slightly forward. Try not to slouch.  Hold the incentive spirometer in an upright position. INSTRUCTIONS FOR USE  1. Sit on the edge of your bed if possible, or sit up as far as you can in bed or on a chair. 2. Hold the incentive spirometer in an upright position. 3. Breathe out normally. 4. Place the mouthpiece in your mouth and seal your lips tightly around it. 5. Breathe in slowly and as deeply as possible, raising the piston or the ball toward the top of the column. 6. Hold your breath for 3-5 seconds or for as long as possible. Allow the piston or ball to fall to the bottom of the column. 7. Remove the mouthpiece from your mouth and breathe out normally. 8. Rest for a few seconds and repeat Steps 1 through 7 at least 10 times every 1-2 hours when you are awake. Take your time and take a few normal breaths between deep breaths. 9. The spirometer may include an indicator to show your best effort. Use the indicator as a goal to work toward during each repetition. 10. After each set of 10 deep breaths, practice coughing to be sure your lungs are clear. If you have an incision (the cut made at the time of surgery), support your incision when coughing by placing a pillow or rolled up towels firmly  against it. Once you are able to get out of bed, walk around indoors and cough well. You may stop using the incentive spirometer when instructed by your caregiver.  RISKS AND COMPLICATIONS  Take your time so you do not get dizzy or light-headed.  If you are in pain, you may need to take or ask for pain medication before doing incentive spirometry. It is harder to take a deep breath if you are having pain. AFTER USE  Rest and breathe slowly and easily.  It can be helpful to keep track of a log of your progress. Your caregiver can provide you with a simple table to help with this. If you are using the spirometer at home, follow these instructions: [place] IF:   You are having difficultly using the spirometer.  You have trouble using the spirometer as often as instructed.  Your pain medication is not giving enough relief while using the spirometer.  You develop fever of 100.5 F (38.1 C) or higher. SEEK IMMEDIATE MEDICAL CARE IF:   You cough up bloody sputum that had not been present before.  You develop fever of 102 F (38.9 C) or greater.  You develop worsening pain at or near the incision site. MAKE SURE YOU:   Understand these instructions.  Will watch your condition.  Will get help right away if you are not doing well or get worse. Document Released: 05/26/2006 Document Revised: 04/07/2011 Document Reviewed: 07/27/2006 [facility] Patient Information 2014 [place], [place].   ________________________________________________________________________

## 2019-01-19 ENCOUNTER — Encounter (HOSPITAL_COMMUNITY)
Admission: RE | Admit: 2019-01-19 | Discharge: 2019-01-19 | Disposition: A | Discharge status: Home / Self Care | Payer: 59 | Source: Ambulatory Visit | Attending: Orthopedic Surgery | Admitting: Orthopedic Surgery

## 2019-01-19 ENCOUNTER — Other Ambulatory Visit: Payer: Self-pay

## 2019-01-19 ENCOUNTER — Encounter (HOSPITAL_COMMUNITY): Payer: Self-pay

## 2019-01-19 DIAGNOSIS — Z01818 Encounter for other preprocedural examination: Secondary | ICD-10-CM | POA: Diagnosis not present

## 2019-01-19 HISTORY — DX: Unspecified asthma, uncomplicated: J45.909

## 2019-01-19 NOTE — Progress Notes (Signed)
PCP - Pricilla Holm.  Cardiologist -   Chest x-ray -  EKG -  Stress Test -  ECHO -  Cardiac Cath -   Sleep Study -  CPAP -   Fasting Blood Sugar -  Checks Blood Sugar _____ times a day  Blood Thinner Instructions:Warfarin/Lovenox. Pt. Got instructions from her primary MD. Nurse about how to bridge her blood thinners. Aspirin Instructions: Last Dose:  Anesthesia review: Pt. Verbalized she's not having any palpitations or chest discomfort,she said those symptoms improved after they start to treat her GERD.  Patient denies shortness of breath, fever, cough and chest pain at PAT appointment   Patient verbalized understanding of instructions that were given to them at the PAT appointment. Patient was also instructed that they will need to review over the PAT instructions again at home before surgery.

## 2019-01-25 ENCOUNTER — Other Ambulatory Visit: Payer: Self-pay

## 2019-01-25 ENCOUNTER — Encounter (HOSPITAL_COMMUNITY)
Admission: RE | Admit: 2019-01-25 | Discharge: 2019-01-25 | Disposition: A | Discharge status: Home / Self Care | Payer: 59 | Source: Ambulatory Visit | Attending: Orthopedic Surgery | Admitting: Orthopedic Surgery

## 2019-01-25 DIAGNOSIS — Z01818 Encounter for other preprocedural examination: Secondary | ICD-10-CM | POA: Diagnosis not present

## 2019-01-25 LAB — CBC WITH DIFFERENTIAL/PLATELET
Abs Immature Granulocytes: 0.02 10*3/uL (ref 0.00–0.07)
Basophils Absolute: 0 10*3/uL (ref 0.0–0.1)
Basophils Relative: 0 %
Eosinophils Absolute: 0.1 10*3/uL (ref 0.0–0.5)
Eosinophils Relative: 2 %
HCT: 44.5 % (ref 36.0–46.0)
Hemoglobin: 14.6 g/dL (ref 12.0–15.0)
Immature Granulocytes: 0 %
Lymphocytes Relative: 29 %
Lymphs Abs: 2 10*3/uL (ref 0.7–4.0)
MCH: 30.5 pg (ref 26.0–34.0)
MCHC: 32.8 g/dL (ref 30.0–36.0)
MCV: 93.1 fL (ref 80.0–100.0)
Monocytes Absolute: 0.5 10*3/uL (ref 0.1–1.0)
Monocytes Relative: 8 %
Neutro Abs: 4.1 10*3/uL (ref 1.7–7.7)
Neutrophils Relative %: 61 %
Platelets: 225 10*3/uL (ref 150–400)
RBC: 4.78 MIL/uL (ref 3.87–5.11)
RDW: 13 % (ref 11.5–15.5)
WBC: 6.8 10*3/uL (ref 4.0–10.5)
nRBC: 0 % (ref 0.0–0.2)

## 2019-01-25 LAB — COMPREHENSIVE METABOLIC PANEL
ALT: 25 U/L (ref 0–44)
AST: 24 U/L (ref 15–41)
Albumin: 4.2 g/dL (ref 3.5–5.0)
Alkaline Phosphatase: 81 U/L (ref 38–126)
Anion gap: 10 (ref 5–15)
BUN: 15 mg/dL (ref 8–23)
CO2: 26 mmol/L (ref 22–32)
Calcium: 9.2 mg/dL (ref 8.9–10.3)
Chloride: 104 mmol/L (ref 98–111)
Creatinine, Ser: 0.81 mg/dL (ref 0.44–1.00)
GFR calc Af Amer: >60 mL/min (ref >60)
GFR calc non Af Amer: >60 mL/min (ref >60)
Glucose, Bld: 126 mg/dL — ABNORMAL HIGH (ref 70–99)
Potassium: 3.9 mmol/L (ref 3.5–5.1)
Sodium: 140 mmol/L (ref 135–145)
Total Bilirubin: 1 mg/dL (ref 0.3–1.2)
Total Protein: 7.2 g/dL (ref 6.5–8.1)

## 2019-01-25 LAB — APTT: aPTT: 36 seconds (ref 24–36)

## 2019-01-25 LAB — SURGICAL PCR SCREEN
MRSA, PCR: NEGATIVE
Staphylococcus aureus: NEGATIVE

## 2019-01-25 LAB — PROTIME-INR
INR: 1.9 — ABNORMAL HIGH (ref 0.8–1.2)
Prothrombin Time: 21.2 seconds — ABNORMAL HIGH (ref 11.4–15.2)

## 2019-01-25 LAB — ABO/RH: ABO/RH(D): O POS

## 2019-01-27 ENCOUNTER — Other Ambulatory Visit (HOSPITAL_COMMUNITY)
Admission: RE | Admit: 2019-01-27 | Discharge: 2019-01-27 | Disposition: A | Discharge status: Home / Self Care | Payer: 59 | Source: Ambulatory Visit | Attending: Orthopedic Surgery | Admitting: Orthopedic Surgery

## 2019-01-27 DIAGNOSIS — Z20828 Contact with and (suspected) exposure to other viral communicable diseases: Secondary | ICD-10-CM | POA: Insufficient documentation

## 2019-01-27 DIAGNOSIS — Z01812 Encounter for preprocedural laboratory examination: Secondary | ICD-10-CM | POA: Diagnosis not present

## 2019-01-28 LAB — NOVEL CORONAVIRUS, NAA (HOSP ORDER, SEND-OUT TO REF LAB; TAT 18-24 HRS): SARS-CoV-2, NAA: NOT DETECTED

## 2019-01-30 MED ORDER — BUPIVACAINE LIPOSOME 1.3 % IJ SUSP
20.0000 mL | INTRAMUSCULAR | Status: DC
  Filled 2019-01-30: qty 20

## 2019-01-30 MED ORDER — TRANEXAMIC ACID 1000 MG/10ML IV SOLN
2000.0000 mg | INTRAVENOUS | Status: DC
  Filled 2019-01-30 (×2): qty 20

## 2019-01-31 ENCOUNTER — Encounter (HOSPITAL_COMMUNITY)
Admission: RE | Disposition: A | Discharge status: Home / Self Care | Payer: Self-pay | Source: Home / Self Care | Attending: Orthopedic Surgery

## 2019-01-31 ENCOUNTER — Ambulatory Visit (HOSPITAL_COMMUNITY): Payer: No Typology Code available for payment source | Admitting: Physician Assistant

## 2019-01-31 ENCOUNTER — Other Ambulatory Visit: Payer: Self-pay

## 2019-01-31 ENCOUNTER — Encounter (HOSPITAL_COMMUNITY): Payer: Self-pay | Admitting: Orthopedic Surgery

## 2019-01-31 ENCOUNTER — Ambulatory Visit (HOSPITAL_COMMUNITY)
Admission: RE | Admit: 2019-01-31 | Discharge: 2019-01-31 | Disposition: A | Discharge status: Home / Self Care | Payer: No Typology Code available for payment source | Attending: Orthopedic Surgery | Admitting: Orthopedic Surgery

## 2019-01-31 DIAGNOSIS — Z7901 Long term (current) use of anticoagulants: Secondary | ICD-10-CM | POA: Diagnosis not present

## 2019-01-31 DIAGNOSIS — M1710 Unilateral primary osteoarthritis, unspecified knee: Secondary | ICD-10-CM

## 2019-01-31 DIAGNOSIS — J45909 Unspecified asthma, uncomplicated: Secondary | ICD-10-CM | POA: Diagnosis not present

## 2019-01-31 DIAGNOSIS — Z87891 Personal history of nicotine dependence: Secondary | ICD-10-CM | POA: Diagnosis not present

## 2019-01-31 DIAGNOSIS — M179 Osteoarthritis of knee, unspecified: Secondary | ICD-10-CM | POA: Diagnosis present

## 2019-01-31 DIAGNOSIS — K219 Gastro-esophageal reflux disease without esophagitis: Secondary | ICD-10-CM | POA: Diagnosis not present

## 2019-01-31 DIAGNOSIS — M25762 Osteophyte, left knee: Secondary | ICD-10-CM | POA: Diagnosis not present

## 2019-01-31 DIAGNOSIS — M25562 Pain in left knee: Secondary | ICD-10-CM | POA: Diagnosis present

## 2019-01-31 DIAGNOSIS — Z86718 Personal history of other venous thrombosis and embolism: Secondary | ICD-10-CM | POA: Insufficient documentation

## 2019-01-31 DIAGNOSIS — M1712 Unilateral primary osteoarthritis, left knee: Secondary | ICD-10-CM | POA: Diagnosis not present

## 2019-01-31 HISTORY — PX: TOTAL KNEE ARTHROPLASTY: SHX125

## 2019-01-31 LAB — PROTIME-INR
INR: 1 (ref 0.8–1.2)
Prothrombin Time: 12.9 seconds (ref 11.4–15.2)

## 2019-01-31 LAB — TYPE AND SCREEN
ABO/RH(D): O POS
Antibody Screen: NEGATIVE

## 2019-01-31 SURGERY — ARTHROPLASTY, KNEE, TOTAL
Anesthesia: Spinal | Site: Knee | Laterality: Left

## 2019-01-31 MED ORDER — ENOXAPARIN SODIUM 150 MG/ML ~~LOC~~ SOLN: 150.0000 mg | SUBCUTANEOUS | 0 refills | Status: DC

## 2019-01-31 MED ORDER — LACTATED RINGERS IV SOLN: INTRAVENOUS | Status: DC

## 2019-01-31 MED ORDER — METHOCARBAMOL 500 MG PO TABS: 500.0000 mg | ORAL_TABLET | Freq: Four times a day (QID) | ORAL | 0 refills | Status: DC | PRN

## 2019-01-31 MED ORDER — LACTATED RINGERS IV BOLUS
250.0000 mL | Freq: Once | INTRAVENOUS | Status: AC
  Administered 2019-01-31: 250 mL via INTRAVENOUS

## 2019-01-31 MED ORDER — POVIDONE-IODINE 10 % EX SWAB
2.0000 "application " | Freq: Once | CUTANEOUS | Status: AC
  Administered 2019-01-31: 2 via TOPICAL

## 2019-01-31 MED ORDER — PROMETHAZINE HCL 25 MG/ML IJ SOLN: 6.2500 mg | INTRAMUSCULAR | Status: DC | PRN

## 2019-01-31 MED ORDER — MEPIVACAINE HCL (PF) 2 % IJ SOLN
INTRAMUSCULAR | Status: DC | PRN
  Administered 2019-01-31: 3 mL via EPIDURAL

## 2019-01-31 MED ORDER — SODIUM CHLORIDE (PF) 0.9 % IJ SOLN
INTRAMUSCULAR | Status: AC
  Filled 2019-01-31: qty 50

## 2019-01-31 MED ORDER — DEXAMETHASONE SODIUM PHOSPHATE 10 MG/ML IJ SOLN
INTRAMUSCULAR | Status: AC
  Filled 2019-01-31: qty 1

## 2019-01-31 MED ORDER — CEFAZOLIN SODIUM-DEXTROSE 2-4 GM/100ML-% IV SOLN
2.0000 g | INTRAVENOUS | Status: AC
  Administered 2019-01-31: 08:00:00 2 g via INTRAVENOUS
  Filled 2019-01-31: qty 100

## 2019-01-31 MED ORDER — CHLORHEXIDINE GLUCONATE 4 % EX LIQD: 60.0000 mL | Freq: Once | CUTANEOUS | Status: DC

## 2019-01-31 MED ORDER — ONDANSETRON HCL 4 MG PO TABS: 4.0000 mg | ORAL_TABLET | Freq: Three times a day (TID) | ORAL | 0 refills | Status: DC | PRN

## 2019-01-31 MED ORDER — PROPOFOL 500 MG/50ML IV EMUL
INTRAVENOUS | Status: AC
  Filled 2019-01-31: qty 50

## 2019-01-31 MED ORDER — LACTATED RINGERS IV BOLUS
250.0000 mL | Freq: Once | INTRAVENOUS | Status: AC
  Administered 2019-01-31: 13:00:00 250 mL via INTRAVENOUS

## 2019-01-31 MED ORDER — SODIUM CHLORIDE (PF) 0.9 % IJ SOLN
INTRAMUSCULAR | Status: DC | PRN
  Administered 2019-01-31: 60 mL

## 2019-01-31 MED ORDER — SODIUM CHLORIDE 0.9 % IR SOLN
Status: DC | PRN
  Administered 2019-01-31: 1000 mL

## 2019-01-31 MED ORDER — 0.9 % SODIUM CHLORIDE (POUR BTL) OPTIME
TOPICAL | Status: DC | PRN
  Administered 2019-01-31: 1000 mL

## 2019-01-31 MED ORDER — OXYCODONE HCL 5 MG PO TABS
ORAL_TABLET | ORAL | Status: AC
  Administered 2019-01-31: 5 mg via ORAL
  Filled 2019-01-31: qty 1

## 2019-01-31 MED ORDER — METHOCARBAMOL 500 MG IVPB - SIMPLE MED: 500.0000 mg | Freq: Four times a day (QID) | INTRAVENOUS | Status: DC | PRN

## 2019-01-31 MED ORDER — MIDAZOLAM HCL 2 MG/2ML IJ SOLN
INTRAMUSCULAR | Status: AC
  Administered 2019-01-31: 08:00:00 2 mg
  Filled 2019-01-31: qty 2

## 2019-01-31 MED ORDER — PROPOFOL 500 MG/50ML IV EMUL
INTRAVENOUS | Status: DC | PRN
  Administered 2019-01-31 (×2): 20 mg via INTRAVENOUS

## 2019-01-31 MED ORDER — GABAPENTIN 300 MG PO CAPS: 300.0000 mg | ORAL_CAPSULE | Freq: Three times a day (TID) | ORAL | 0 refills | Status: DC

## 2019-01-31 MED ORDER — ONDANSETRON HCL 4 MG/2ML IJ SOLN
INTRAMUSCULAR | Status: DC | PRN
  Administered 2019-01-31: 4 mg via INTRAVENOUS

## 2019-01-31 MED ORDER — BUPIVACAINE LIPOSOME 1.3 % IJ SUSP
INTRAMUSCULAR | Status: DC | PRN
  Administered 2019-01-31: 20 mL

## 2019-01-31 MED ORDER — TRAMADOL HCL 50 MG PO TABS: 50.0000 mg | ORAL_TABLET | Freq: Four times a day (QID) | ORAL | 0 refills | Status: AC | PRN

## 2019-01-31 MED ORDER — CEFAZOLIN SODIUM-DEXTROSE 2-4 GM/100ML-% IV SOLN: 2.0000 g | Freq: Four times a day (QID) | INTRAVENOUS | Status: DC

## 2019-01-31 MED ORDER — BUPIVACAINE HCL (PF) 0.5 % IJ SOLN
INTRAMUSCULAR | Status: DC | PRN
  Administered 2019-01-31: 20 mL via PERINEURAL

## 2019-01-31 MED ORDER — HYDROMORPHONE HCL 1 MG/ML IJ SOLN: 0.2500 mg | INTRAMUSCULAR | Status: DC | PRN

## 2019-01-31 MED ORDER — STERILE WATER FOR IRRIGATION IR SOLN
Status: DC | PRN
  Administered 2019-01-31: 2000 mL

## 2019-01-31 MED ORDER — OXYCODONE HCL 5 MG PO TABS: 5.0000 mg | ORAL_TABLET | Freq: Four times a day (QID) | ORAL | 0 refills | Status: AC | PRN

## 2019-01-31 MED ORDER — PHENYLEPHRINE HCL-NACL 10-0.9 MG/250ML-% IV SOLN
INTRAVENOUS | Status: DC | PRN
  Administered 2019-01-31: 50 ug/min via INTRAVENOUS

## 2019-01-31 MED ORDER — TRANEXAMIC ACID 1000 MG/10ML IV SOLN
INTRAVENOUS | Status: DC | PRN
  Administered 2019-01-31: 2000 mg via TOPICAL

## 2019-01-31 MED ORDER — DEXAMETHASONE SODIUM PHOSPHATE 10 MG/ML IJ SOLN
8.0000 mg | Freq: Once | INTRAMUSCULAR | Status: AC
  Administered 2019-01-31: 8 mg via INTRAVENOUS

## 2019-01-31 MED ORDER — SODIUM CHLORIDE (PF) 0.9 % IJ SOLN
INTRAMUSCULAR | Status: AC
  Filled 2019-01-31: qty 10

## 2019-01-31 MED ORDER — FENTANYL CITRATE (PF) 100 MCG/2ML IJ SOLN
INTRAMUSCULAR | Status: AC
  Administered 2019-01-31: 08:00:00 50 ug
  Filled 2019-01-31: qty 2

## 2019-01-31 MED ORDER — OXYCODONE HCL 5 MG PO TABS: 5.0000 mg | ORAL_TABLET | ORAL | Status: DC | PRN

## 2019-01-31 MED ORDER — METHOCARBAMOL 500 MG PO TABS
500.0000 mg | ORAL_TABLET | Freq: Four times a day (QID) | ORAL | Status: DC | PRN
  Administered 2019-01-31: 500 mg via ORAL

## 2019-01-31 MED ORDER — LACTATED RINGERS IV BOLUS
500.0000 mL | Freq: Once | INTRAVENOUS | Status: AC
  Administered 2019-01-31: 500 mL via INTRAVENOUS

## 2019-01-31 MED ORDER — ONDANSETRON HCL 4 MG/2ML IJ SOLN
INTRAMUSCULAR | Status: AC
  Filled 2019-01-31: qty 2

## 2019-01-31 MED ORDER — ACETAMINOPHEN 10 MG/ML IV SOLN
1000.0000 mg | Freq: Four times a day (QID) | INTRAVENOUS | Status: DC
  Administered 2019-01-31: 1000 mg via INTRAVENOUS
  Filled 2019-01-31: qty 100

## 2019-01-31 MED ORDER — PROPOFOL 500 MG/50ML IV EMUL
INTRAVENOUS | Status: DC | PRN
  Administered 2019-01-31: 75 ug/kg/min via INTRAVENOUS

## 2019-01-31 MED ORDER — WARFARIN SODIUM 5 MG PO TABS: ORAL_TABLET | ORAL | 1 refills | Status: DC

## 2019-01-31 MED ORDER — METHOCARBAMOL 500 MG PO TABS
ORAL_TABLET | ORAL | Status: AC
  Filled 2019-01-31: qty 1

## 2019-01-31 SURGICAL SUPPLY — 63 items
ATTUNE MED DOME PAT 38 KNEE (Knees) ×1 IMPLANT
ATTUNE PS FEM LT SZ 6 CEM KNEE (Femur) ×2 IMPLANT
ATTUNE PSRP INSR SZ6 8 KNEE (Insert) ×2 IMPLANT
BAG SPEC THK2 15X12 ZIP CLS (MISCELLANEOUS) ×1
BAG ZIPLOCK 12X15 (MISCELLANEOUS) ×2 IMPLANT
BASE TIBIAL ROT PLAT SZ 5 KNEE (Knees) IMPLANT
BLADE SAG 18X100X1.27 (BLADE) ×2 IMPLANT
BLADE SAW SGTL 11.0X1.19X90.0M (BLADE) ×2 IMPLANT
BLADE SURG SZ10 CARB STEEL (BLADE) ×4 IMPLANT
BNDG CONFORM 4 STRL LF (GAUZE/BANDAGES/DRESSINGS) ×1 IMPLANT
BNDG ELASTIC 6X5.8 VLCR STR LF (GAUZE/BANDAGES/DRESSINGS) ×2 IMPLANT
BOWL SMART MIX CTS (DISPOSABLE) ×2 IMPLANT
BSPLAT TIB 5 CMNT ROT PLAT STR (Knees) ×1 IMPLANT
CEMENT HV SMART SET (Cement) ×4 IMPLANT
COVER SURGICAL LIGHT HANDLE (MISCELLANEOUS) ×2 IMPLANT
COVER WAND RF STERILE (DRAPES) IMPLANT
CUFF TOURN SGL QUICK 34 (TOURNIQUET CUFF) ×2
CUFF TRNQT CYL 34X4.125X (TOURNIQUET CUFF) ×1 IMPLANT
DECANTER SPIKE VIAL GLASS SM (MISCELLANEOUS) ×2 IMPLANT
DRAPE U-SHAPE 47X51 STRL (DRAPES) ×2 IMPLANT
DRESSING AQUACEL AG SP 3.5X6 (GAUZE/BANDAGES/DRESSINGS) IMPLANT
DRSG ADAPTIC 3X8 NADH LF (GAUZE/BANDAGES/DRESSINGS) ×2 IMPLANT
DRSG AQUACEL AG SP 3.5X6 (GAUZE/BANDAGES/DRESSINGS) ×2
DRSG PAD ABDOMINAL 8X10 ST (GAUZE/BANDAGES/DRESSINGS) ×2 IMPLANT
DURAPREP 26ML APPLICATOR (WOUND CARE) ×2 IMPLANT
ELECT REM PT RETURN 15FT ADLT (MISCELLANEOUS) ×2 IMPLANT
EVACUATOR 1/8 PVC DRAIN (DRAIN) ×2 IMPLANT
GAUZE SPONGE 4X4 12PLY STRL (GAUZE/BANDAGES/DRESSINGS) ×2 IMPLANT
GLOVE BIO SURGEON STRL SZ7 (GLOVE) ×2 IMPLANT
GLOVE BIO SURGEON STRL SZ8 (GLOVE) ×2 IMPLANT
GLOVE BIOGEL PI IND STRL 6.5 (GLOVE) ×1 IMPLANT
GLOVE BIOGEL PI IND STRL 7.0 (GLOVE) ×1 IMPLANT
GLOVE BIOGEL PI IND STRL 8 (GLOVE) ×1 IMPLANT
GLOVE BIOGEL PI INDICATOR 6.5 (GLOVE) ×1
GLOVE BIOGEL PI INDICATOR 7.0 (GLOVE) ×1
GLOVE BIOGEL PI INDICATOR 8 (GLOVE) ×1
GLOVE SURG SS PI 6.5 STRL IVOR (GLOVE) ×2 IMPLANT
GOWN STRL REUS W/TWL LRG LVL3 (GOWN DISPOSABLE) ×6 IMPLANT
HANDPIECE INTERPULSE COAX TIP (DISPOSABLE) ×2
HOLDER FOLEY CATH W/STRAP (MISCELLANEOUS) IMPLANT
IMMOBILIZER KNEE 20 (SOFTGOODS) ×2
IMMOBILIZER KNEE 20 THIGH 36 (SOFTGOODS) ×1 IMPLANT
KIT TURNOVER KIT A (KITS) IMPLANT
MANIFOLD NEPTUNE II (INSTRUMENTS) ×2 IMPLANT
NS IRRIG 1000ML POUR BTL (IV SOLUTION) ×2 IMPLANT
PACK TOTAL KNEE CUSTOM (KITS) ×2 IMPLANT
PADDING CAST COTTON 6X4 STRL (CAST SUPPLIES) ×6 IMPLANT
PENCIL SMOKE EVACUATOR (MISCELLANEOUS) IMPLANT
PIN DRILL FIX HALF THREAD (BIT) ×2 IMPLANT
PIN STEINMAN FIXATION KNEE (PIN) ×1 IMPLANT
PROTECTOR NERVE ULNAR (MISCELLANEOUS) ×2 IMPLANT
SET HNDPC FAN SPRY TIP SCT (DISPOSABLE) ×1 IMPLANT
STRIP CLOSURE SKIN 1/2X4 (GAUZE/BANDAGES/DRESSINGS) ×3 IMPLANT
SUT MNCRL AB 4-0 PS2 18 (SUTURE) ×2 IMPLANT
SUT STRATAFIX 0 PDS 27 VIOLET (SUTURE) ×2
SUT VIC AB 2-0 CT1 27 (SUTURE) ×6
SUT VIC AB 2-0 CT1 TAPERPNT 27 (SUTURE) ×3 IMPLANT
SUTURE STRATFX 0 PDS 27 VIOLET (SUTURE) ×1 IMPLANT
TIBIAL BASE ROT PLAT SZ 5 KNEE (Knees) ×2 IMPLANT
TRAY FOLEY MTR SLVR 16FR STAT (SET/KITS/TRAYS/PACK) ×2 IMPLANT
WATER STERILE IRR 1000ML POUR (IV SOLUTION) ×4 IMPLANT
WRAP KNEE MAXI GEL POST OP (GAUZE/BANDAGES/DRESSINGS) ×2 IMPLANT
YANKAUER SUCT BULB TIP 10FT TU (MISCELLANEOUS) ×2 IMPLANT

## 2019-01-31 NOTE — Anesthesia Procedure Notes (Signed)
Date/Time: 01/31/2019 8:07 AM Performed by: Glory Buff, CRNA Oxygen Delivery Method: Simple face mask

## 2019-01-31 NOTE — Anesthesia Postprocedure Evaluation (Signed)
Anesthesia Post Note  Patient: Megan Parrish  Procedure(s) Performed: TOTAL KNEE ARTHROPLASTY (Left Knee)     Patient location during evaluation: PACU Anesthesia Type: Spinal Level of consciousness: oriented and awake and alert Pain management: pain level controlled Vital Signs Assessment: post-procedure vital signs reviewed and stable Respiratory status: spontaneous breathing, respiratory function stable and patient connected to nasal cannula oxygen Cardiovascular status: blood pressure returned to baseline and stable Postop Assessment: no headache, no backache and no apparent nausea or vomiting Anesthetic complications: no    Last Vitals:  Vitals:   01/31/19 1045 01/31/19 1100  BP: 134/86 133/89  Pulse: 66 70  Resp: 16 20  Temp: 36.4 C   SpO2: 94% 98%    Last Pain:  Vitals:   01/31/19 1100  TempSrc:   PainSc: 0-No pain                 Vineeth Fell S

## 2019-01-31 NOTE — Transfer of Care (Signed)
Immediate Anesthesia Transfer of Care Note  Patient: Megan Parrish  Procedure(s) Performed: TOTAL KNEE ARTHROPLASTY (Left Knee)  Patient Location: PACU  Anesthesia Type:Spinal  Level of Consciousness: awake, alert  and oriented  Airway & Oxygen Therapy: Patient Spontanous Breathing and Patient connected to face mask oxygen  Post-op Assessment: Report given to RN and Post -op Vital signs reviewed and stable  Post vital signs: Reviewed and stable  Last Vitals:  Vitals Value Taken Time  BP 97/66 01/31/19 0945  Temp    Pulse 67 01/31/19 0944  Resp 15 01/31/19 0944  SpO2 98 % 01/31/19 0944  Vitals shown include unvalidated device data.  Last Pain:  Vitals:   01/31/19 0620  TempSrc: Oral         Complications: No apparent anesthesia complications

## 2019-01-31 NOTE — Interval H&P Note (Signed)
History and Physical Interval Note:  01/31/2019 6:53 AM  Megan Parrish  has presented today for surgery, with the diagnosis of left knee osteoarthritis.  The various methods of treatment have been discussed with the patient and family. After consideration of risks, benefits and other options for treatment, the patient has consented to  Procedure(s) with comments: TOTAL KNEE ARTHROPLASTY (Left) - 37min as a surgical intervention.  The patient's history has been reviewed, patient examined, no change in status, stable for surgery.  I have reviewed the patient's chart and labs.  Questions were answered to the patient's satisfaction.     Pilar Plate Delana Manganello

## 2019-01-31 NOTE — Discharge Instructions (Addendum)
Dr. Gaynelle Arabian Total Joint Specialist [facility] [address], [address] [place], [place] [address] [phone]    TOTAL KNEE REPLACEMENT POSTOPERATIVE DIRECTIONS       Knee Rehabilitation, Guidelines Following Surgery  Results after knee surgery are often greatly improved when you follow the exercise, range of motion and muscle strengthening exercises prescribed by your doctor. Safety measures are also important to protect the knee from further injury. Any time any of these exercises cause you to have increased pain or swelling in your knee joint, decrease the amount until you are comfortable again and slowly increase them. If you have problems or questions, call your caregiver or physical therapist for advice.   HOME CARE INSTRUCTIONS  . Remove items at home which could result in a fall. This includes throw rugs or furniture in walking pathways.   ICE to the affected knee as much as tolerated. Icing helps control swelling. If the swelling is well controlled you will be more comfortable and rehab easier. Continue to use ice on the knee for pain and swelling from surgery. You may notice swelling that will progress down to the foot and ankle.  This is normal after surgery.  Elevate the leg when you are not up walking on it.    Continue to use the breathing machine which will help keep your temperature down.  It is common for your temperature to cycle up and down following surgery, especially at night when you are not up moving around and exerting yourself.  The breathing machine keeps your lungs expanded and your temperature down.  Do not place pillow under knee, focus on keeping the knee straight while resting  PAIN CONTROL Achieving adequate pain control can be challenging in the first 48-72 hours after the nerve block wears off. During this time it is best to stay ahead of the pain by taking your prescribed pain meds every 4-6 hours. Once the pain is well controlled (you  will not be pain free but the goal is having it under control) you may begin slowly weaning off the medications  DIET You may resume your previous home diet once your are discharged from the hospital.  DRESSING / WOUND CARE / SHOWERING Keep your bulky bandage on for 2 days. On the third post-operative day you may remove the Ace bandage, cotton and gauze,. There is a waterproof adhesive bandage on your skin which will stay on for 7-10 days. Once you remove this you will not need to place another bandage You may begin showering 3 days after surgery  ACTIVITY For the first 5 days the key is rest and control of pain and swelling You should rest, ice and elevate the leg for 50 minutes out of every hour. Get up and walk/stretch for 10 minutes per hour. After 5 days you can increase your activity slowly as tolerated Walk with your walker as instructed. Use walker as long as suggested by your caregivers. Avoid periods of inactivity such as sitting longer than an hour when not asleep. This helps prevent blood clots.  Do your home exercises twice a day starting on post-operative day 3. On the days you go to Physical Therapy, just do the home exercises once that day Do not drive a car until released by you surgeon.  Do not drive while taking narcotics.  WEIGHT BEARING You may bear weight as tolerated on the operative leg  POSTOPERATIVE CONSTIPATION PROTOCOL Constipation - defined medically as fewer than three stools per week and  severe constipation as less than one stool per week.  One of the most common issues patients have following surgery is constipation.  Even if you have a regular bowel pattern at home, your normal regimen is likely to be disrupted due to multiple reasons following surgery.  Combination of anesthesia, postoperative narcotics, change in appetite and fluid intake all can affect your bowels.  In order to avoid complications following surgery, here are some recommendations in order  to help you during your recovery period.  Colace (docusate) - Pick up an over-the-counter form of Colace or another stool softener and take twice a day as long as you are requiring postoperative pain medications.  Take with a full glass of water daily.  If you experience loose stools or diarrhea, hold the colace until you stool forms back up.  If your symptoms do not get better within 1 week or if they get worse, check with your doctor.  MiraLax (polyethylene glycol) - Pick up over-the-counter to have on hand.  MiraLax is a solution that will increase the amount of water in your bowels to assist with bowel movements.  Take as directed and can mix with a glass of water, juice, soda, coffee, or tea.  Take if you go more than two days without a movement. Do not use MiraLax more than once per day. Call your doctor if you are still constipated or irregular after using this medication for 7 days in a row.  If you continue to have problems with postoperative constipation, please contact the office for further assistance and recommendations.  If you experience "the worst abdominal pain ever" or develop nausea or vomiting, please contact the office immediatly for further recommendations for treatment.  ITCHING  If you experience itching with your medications, try taking only a single pain pill, or even half a pain pill at a time.  You can also use Benadryl over the counter for itching or also to help with sleep.   TED HOSE STOCKINGS Wear the elastic stockings on both legs for three weeks following surgery during the day but you may remove then at night for sleeping.  MEDICATIONS See your medication summary on the "After Visit Summary" that the nursing staff will review with you prior to discharge.  You may have some home medications which will be placed on hold until you complete the course of blood thinner medication.  It is important for you to complete the blood thinner medication as prescribed by your  surgeon.  Continue your approved medications as instructed at time of discharge.  PRECAUTIONS If you experience chest pain or shortness of breath - call 911 immediately for transfer to the hospital emergency department.  If you develop a fever greater that 101 F, purulent drainage from wound, increased redness or drainage from wound, foul odor from the wound/dressing, or calf pain - CONTACT YOUR SURGEON.                                                   FOLLOW-UP APPOINTMENTS Make sure you keep all of your appointments after your operation with your surgeon and caregivers. You should call the office at the above phone number and make an appointment for approximately two weeks after the date of your surgery or on the date instructed by your surgeon outlined in the "After Visit Summary".  MAKE SURE YOU:  . Understand these instructions.  . Get help right away if you are not doing well or get worse.    Pick up stool softner and laxative for home use following surgery while on pain medications. May shower starting three days after surgery. Please use a clean towel to pat the incision dry following showers. Continue to use ice for pain and swelling after surgery. Do not use any lotions or creams on the incision until instructed by your surgeon.

## 2019-01-31 NOTE — Progress Notes (Signed)
Assisted Dr. Miller with left, ultrasound guided, adductor canal block. Side rails up, monitors on throughout procedure. See vital signs in flow sheet. Tolerated Procedure well.  

## 2019-01-31 NOTE — Anesthesia Preprocedure Evaluation (Addendum)
Anesthesia Evaluation  Patient identified by MRN, date of birth, ID band Patient awake    Reviewed: Allergy & Precautions, NPO status , Patient's Chart, lab work & pertinent test results  Airway Mallampati: II  TM Distance: >3 FB Neck ROM: Full    Dental no notable dental hx.    Pulmonary neg pulmonary ROS, former smoker,    Pulmonary exam normal breath sounds clear to auscultation       Cardiovascular + DVT  Normal cardiovascular exam Rhythm:Regular Rate:Normal     Neuro/Psych negative neurological ROS  negative psych ROS   GI/Hepatic Neg liver ROS, GERD  ,  Endo/Other  negative endocrine ROS  Renal/GU negative Renal ROS  negative genitourinary   Musculoskeletal  (+) Arthritis , Osteoarthritis,    Abdominal   Peds negative pediatric ROS (+)  Hematology negative hematology ROS (+)   Anesthesia Other Findings   Reproductive/Obstetrics negative OB ROS                             Anesthesia Physical Anesthesia Plan  ASA: II  Anesthesia Plan: Spinal   Post-op Pain Management:  Regional for Post-op pain   Induction: Intravenous  PONV Risk Score and Plan: 2 and Ondansetron, Dexamethasone and Treatment may vary due to age or medical condition  Airway Management Planned: Simple Face Mask  Additional Equipment:   Intra-op Plan:   Post-operative Plan:   Informed Consent: I have reviewed the patients History and Physical, chart, labs and discussed the procedure including the risks, benefits and alternatives for the proposed anesthesia with the patient or authorized representative who has indicated his/her understanding and acceptance.     Dental advisory given  Plan Discussed with: CRNA and Surgeon  Anesthesia Plan Comments:         Anesthesia Quick Evaluation

## 2019-01-31 NOTE — Anesthesia Procedure Notes (Signed)
Spinal  Patient location during procedure: OR Start time: 01/31/2019 8:13 AM End time: 01/31/2019 8:17 AM Staffing Performed: resident/CRNA  Anesthesiologist: Myrtie Soman, MD Resident/CRNA: Glory Buff, CRNA Preanesthetic Checklist Completed: patient identified, IV checked, site marked, risks and benefits discussed, surgical consent, monitors and equipment checked, pre-op evaluation and timeout performed Spinal Block Patient position: sitting Prep: DuraPrep Patient monitoring: heart rate, cardiac monitor, continuous pulse ox and blood pressure Approach: midline Location: L3-4 Injection technique: single-shot Needle Needle type: Pencan  Needle gauge: 24 G Needle length: 9 cm Assessment Sensory level: T6 Additional Notes Kit expiration date checked and verified.  Sterile prep and drape, stick x 1, - heme, - paraesthesia, + CSF pre and post injection, patient tolerated procedure well.

## 2019-01-31 NOTE — Anesthesia Procedure Notes (Signed)
Anesthesia Regional Block: Adductor canal block   Pre-Anesthetic Checklist: ,, timeout performed, Correct Patient, Correct Site, Correct Laterality, Correct Procedure, Correct Position, site marked, Risks and benefits discussed,  Surgical consent,  Pre-op evaluation,  At surgeon's request and post-op pain management  Laterality: Left  Prep: chloraprep       Needles:  Injection technique: Single-shot  Needle Type: Echogenic Needle     Needle Length: 9cm      Additional Needles:   Procedures:,,,, ultrasound used (permanent image in chart),,,,  Narrative:  Start time: 01/31/2019 7:30 AM End time: 01/31/2019 7:36 AM Injection made incrementally with aspirations every 5 mL.  Performed by: Personally  Anesthesiologist: Myrtie Soman, MD  Additional Notes: Patient tolerated the procedure well without complications

## 2019-01-31 NOTE — Anesthesia Procedure Notes (Signed)
Anesthesia Procedure Image    

## 2019-01-31 NOTE — Evaluation (Signed)
Physical Therapy Evaluation Patient Details Name: Megan Parrish MRN: [id number] DOB: 06-Feb-1955 Today's Date: 01/31/2019   History of Present Illness  Patient is 64 y.o. female s/p Lt TKA On 01/31/19 with PMH significant for GERD, asthma, and past DVT's.  Clinical Impression  Megan Parrish is a 64 y.o. female POD 0 s/p Lt TKA. Patient reports independence with mobility at baseline. Patient is now limited by functional impairments (see PT problem list below) and requires min guard/supervision for transfers and gait with RW. Patient was able to ambulate ~100 feet with RW and supervision and cues for safe walker management. Patient educated on safe sequencing for stair mobility and verbalized safe guarding position for people assisting with mobility. Patient instructed in exercises to facilitate ROM and circulation. Patient will benefit from continued skilled PT interventions to address impairments and progress towards PLOF. Patient has met mobility goals at adequate level for discharge home; will continue to follow if pt continues acute stay to progress towards Mod I goals.     Follow Up Recommendations Follow surgeon's recommendation for DC plan and follow-up therapies    Equipment Recommendations  None recommended by PT    Recommendations for Other Services       Precautions / Restrictions Precautions Precautions: Fall Restrictions Weight Bearing Restrictions: No      Mobility  Bed Mobility Overal bed mobility: Needs Assistance Bed Mobility: Supine to Sit     Supine to sit: Supervision;HOB elevated     General bed mobility comments: pt requries extra time, no cues or assist  Transfers Overall transfer level: Needs assistance Equipment used: Rolling walker (2 wheeled) Transfers: Sit to/from [facility] Sit to Stand: Supervision Stand pivot transfers: Supervision       General transfer comment: cues for safe hand placement and technique with RW, no assist  required for power up. verbal cues for safe reach back to [facility] and recliner and to kick Lt knee out. verbal cues for step pattern in RW during stand step transfer to [facility].  Ambulation/Gait Ambulation/Gait assistance: Supervision;Min guard Gait Distance (Feet): 100 Feet Assistive device: Rolling walker (2 wheeled)   Gait velocity: decreased   General Gait Details: cues for safe step sequencing and to maintain safe proximity to RW, no assist required, and no overt LOB noted.  Stairs Stairs: Yes Stairs assistance: Supervision;Min guard Stair Management: No rails;Step to pattern;With walker;Forwards;Backwards Number of Stairs: 4(2x 2) General stair comments: verbal cues for safe hand placement and technique with RW, assist to steady RW during stair mobility and pt verbalized safe guarding position for person(s) helping her at home.  Wheelchair Mobility    Modified Rankin (Stroke Patients Only)       Balance Overall balance assessment: Needs assistance   Sitting balance-Leahy Scale: Good     Standing balance support: During functional activity;Bilateral upper extremity supported Standing balance-Leahy Scale: Fair            Pertinent Vitals/Pain Pain Assessment: 0-10 Pain Score: 2  Pain Location: Lt knee Pain Descriptors / Indicators: Aching Pain Intervention(s): Monitored during session    Home Living Family/patient expects to be discharged to:: Private residence Living Arrangements: Other relatives(sister in law and sister will stay with her) Available Help at Discharge: Family;Available 24 hours/day(family with her for 1 week) Type of Home: House Home Access: Stairs to enter Entrance Stairs-Rails: None Entrance Stairs-Number of Steps: 2 steps in from garage Home Layout: One level Home Equipment: Environmental consultant - 2 wheels;Bedside commode  Prior Function Level of Independence: Independent         Comments: works full time for [employer] and has been  independent with mobility. pt enjoys walking her dog.     Hand Dominance   Dominant Hand: Right    Extremity/Trunk Assessment   Upper Extremity Assessment Upper Extremity Assessment: Overall WFL for tasks assessed    Lower Extremity Assessment Lower Extremity Assessment: LLE deficits/detail LLE Deficits / Details: good quad activation in supine, no extensor lag with SLR LLE Sensation: WNL LLE Coordination: WNL    Cervical / Trunk Assessment Cervical / Trunk Assessment: Normal  Communication   Communication: No difficulties  Cognition Arousal/Alertness: Awake/alert Behavior During Therapy: WFL for tasks assessed/performed Overall Cognitive Status: Within Functional Limits for tasks assessed           General Comments      Exercises Total Joint Exercises Ankle Circles/Pumps: AROM;10 reps;Seated;Both Quad Sets: AROM;Seated;10 reps;Supine;Left Short Arc Quad: AROM;5 reps;Seated;Left Heel Slides: 5 reps;Seated;Left;AAROM Hip ABduction/ADduction: AROM;Seated;5 reps;Left Straight Leg Raises: AROM;5 reps;Seated;Left Long Arc Quad: AROM;5 reps;Seated;Left Knee Flexion: AROM;AAROM;10 reps;Seated;Left   Assessment/Plan    PT Assessment Patient needs continued PT services  PT Problem List Decreased strength;Decreased activity tolerance;Decreased mobility;Decreased range of motion;Decreased balance;Decreased knowledge of use of DME       PT Treatment Interventions DME instruction;Functional mobility training;Balance training;Patient/family education;Therapeutic activities;Gait training;Stair training;Therapeutic exercise    PT Goals (Current goals can be found in the Care Plan section)  Acute Rehab PT Goals Patient Stated Goal: to get home today PT Goal Formulation: With patient Time For Goal Achievement: 02/07/19 Potential to Achieve Goals: Good    Frequency 7X/week    AM-PAC PT "6 Clicks" Mobility  Outcome Measure Help needed turning from your back to your side  while in a flat bed without using bedrails?: A Little Help needed moving from lying on your back to sitting on the side of a flat bed without using bedrails?: A Little Help needed moving to and from a bed to a chair (including a wheelchair)?: A Little Help needed standing up from a chair using your arms (e.g., wheelchair or bedside chair)?: A Little Help needed to walk in hospital room?: A Little Help needed climbing 3-5 steps with a railing? : A Little 6 Click Score: 18    End of Session Equipment Utilized During Treatment: Gait belt Activity Tolerance: Patient tolerated treatment well Patient left: with call bell/phone within reach;in chair Nurse Communication: Mobility status PT Visit Diagnosis: Muscle weakness (generalized) (M62.81);Difficulty in walking, not elsewhere classified (R26.2)    Time: 8610-4247 PT Time Calculation (min) (ACUTE ONLY): 39 min   Charges:   PT Evaluation $PT Eval Low Complexity: 1 Low PT Treatments $Gait Training: 8-22 mins $Therapeutic Exercise: 8-22 mins       Verner Mould, DPT Physical Therapist with [facility] [phone]  01/31/2019 1:07 PM

## 2019-01-31 NOTE — Op Note (Signed)
OPERATIVE REPORT-TOTAL KNEE ARTHROPLASTY   Pre-operative diagnosis- Osteoarthritis  Left knee(s)  Post-operative diagnosis- Osteoarthritis Left knee(s)  Procedure-  Left  Total Knee Arthroplasty  Surgeon- Dione Plover. Tanner Yeley, MD  Assistant- Ardeen Jourdain, PA-C   Anesthesia-  Adductor canal block and spinal  EBL-25 ml   Drains Hemovac  Tourniquet time-  Total Tourniquet Time Documented: Thigh (Left) - 35 minutes Total: Thigh (Left) - 35 minutes     Complications- None  Condition-PACU - hemodynamically stable.   Brief Clinical Note  Megan Parrish is a 64 y.o. year old female with end stage OA of her left knee with progressively worsening pain and dysfunction. She has constant pain, with activity and at rest and significant functional deficits with difficulties even with ADLs. She has had extensive non-op management including analgesics, injections of cortisone and viscosupplements, and home exercise program, but remains in significant pain with significant dysfunction. Radiographs show bone on bone arthritis medial and patellofemoral. She presents now for left Total Knee Arthroplasty.    Procedure in detail---   The patient is brought into the operating room and positioned supine on the operating table. After successful administration of  Adductor canal block and spinal,   a tourniquet is placed high on the  Left thigh(s) and the lower extremity is prepped and draped in the usual sterile fashion. Time out is performed by the operating team and then the  Left lower extremity is wrapped in Esmarch, knee flexed and the tourniquet inflated to 300 mmHg.       A midline incision is made with a ten blade through the subcutaneous tissue to the level of the extensor mechanism. A fresh blade is used to make a medial parapatellar arthrotomy. Soft tissue over the proximal medial tibia is subperiosteally elevated to the joint line with a knife and into the semimembranosus bursa with a Cobb  elevator. Soft tissue over the proximal lateral tibia is elevated with attention being paid to avoiding the patellar tendon on the tibial tubercle. The patella is everted, knee flexed 90 degrees and the ACL and PCL are removed. Findings are bone on bone medial and patellofemoral with large global osteophytes.        The drill is used to create a starting hole in the distal femur and the canal is thoroughly irrigated with sterile saline to remove the fatty contents. The 5 degree Left  valgus alignment guide is placed into the femoral canal and the distal femoral cutting block is pinned to remove 9 mm off the distal femur. Resection is made with an oscillating saw.      The tibia is subluxed forward and the menisci are removed. The extramedullary alignment guide is placed referencing proximally at the medial aspect of the tibial tubercle and distally along the second metatarsal axis and tibial crest. The block is pinned to remove 37mm off the more deficient medial  side. Resection is made with an oscillating saw. Size 5is the most appropriate size for the tibia and the proximal tibia is prepared with the modular drill and keel punch for that size.      The femoral sizing guide is placed and size 6 is most appropriate. Rotation is marked off the epicondylar axis and confirmed by creating a rectangular flexion gap at 90 degrees. The size 6 cutting block is pinned in this rotation and the anterior, posterior and chamfer cuts are made with the oscillating saw. The intercondylar block is then placed and that cut is made.  Trial size 5 tibial component, trial size 6 narrow posterior stabilized femur and a 8  mm posterior stabilized rotating platform insert trial is placed. Full extension is achieved with excellent varus/valgus and anterior/posterior balance throughout full range of motion. The patella is everted and thickness measured to be 24  mm. Free hand resection is taken to 14 mm, a 38 template is placed, lug  holes are drilled, trial patella is placed, and it tracks normally. Osteophytes are removed off the posterior femur with the trial in place. All trials are removed and the cut bone surfaces prepared with pulsatile lavage. Cement is mixed and once ready for implantation, the size 5 tibial implant, size  6 narrow posterior stabilized femoral component, and the size 38 patella are cemented in place and the patella is held with the clamp. The trial insert is placed and the knee held in full extension. The Exparel (20 ml mixed with 60 ml saline) is injected into the extensor mechanism, posterior capsule, medial and lateral gutters and subcutaneous tissues.  All extruded cement is removed and once the cement is hard the permanent 8 mm posterior stabilized rotating platform insert is placed into the tibial tray.      The wound is copiously irrigated with saline solution and the extensor mechanism closed over a hemovac drain with #1 V-loc suture. The tourniquet is released for a total tourniquet time of 35  minutes. Flexion against gravity is 140 degrees and the patella tracks normally. Subcutaneous tissue is closed with 2.0 vicryl and subcuticular with running 4.0 Monocryl. The incision is cleaned and dried and steri-strips and a bulky sterile dressing are applied. The limb is placed into a knee immobilizer and the patient is awakened and transported to recovery in stable condition.      Please note that a surgical assistant was a medical necessity for this procedure in order to perform it in a safe and expeditious manner. Surgical assistant was necessary to retract the ligaments and vital neurovascular structures to prevent injury to them and also necessary for proper positioning of the limb to allow for anatomic placement of the prosthesis.   Dione Plover Wenceslaus Gist, MD    01/31/2019, 9:18 AM

## 2019-02-01 ENCOUNTER — Encounter: Payer: Self-pay | Admitting: *Deleted

## 2019-02-08 ENCOUNTER — Ambulatory Visit (INDEPENDENT_AMBULATORY_CARE_PROVIDER_SITE_OTHER): Payer: 59 | Admitting: General Practice

## 2019-02-08 ENCOUNTER — Other Ambulatory Visit: Payer: Self-pay

## 2019-02-08 DIAGNOSIS — Z7901 Long term (current) use of anticoagulants: Secondary | ICD-10-CM | POA: Diagnosis not present

## 2019-02-08 LAB — POCT INR: INR: 1.8 — AB (ref 2.0–3.0)

## 2019-02-08 NOTE — Progress Notes (Signed)
Medical treatment/procedure(s) were performed by non-physician practitioner and as supervising physician I was immediately available for consultation/collaboration. I agree with above. Sri Clegg A Dereona Kolodny, MD  

## 2019-02-08 NOTE — Patient Instructions (Addendum)
.  lbpcmh  Take 2 tablets (10 mg) today and then continue to take 2 tablets daily except 1 tablet on Sun Tues and Thursday.  Re-check in 4 weeks.

## 2019-02-17 ENCOUNTER — Telehealth: Payer: Self-pay

## 2019-02-17 NOTE — Telephone Encounter (Signed)
Patient calling and states that she wanted to move her coumadin appointment. States that she was scheduled for 03/08/2019 at 1:00pm. Advised that she was originally schedule for 02/28/2019 at 1:00pm. No schedule available that day. Patient was moved to 03/08/2019 at 8:30am.

## 2019-02-28 ENCOUNTER — Ambulatory Visit: Payer: 59

## 2019-03-08 ENCOUNTER — Ambulatory Visit (INDEPENDENT_AMBULATORY_CARE_PROVIDER_SITE_OTHER): Payer: 59 | Admitting: General Practice

## 2019-03-08 ENCOUNTER — Other Ambulatory Visit: Payer: Self-pay

## 2019-03-08 ENCOUNTER — Other Ambulatory Visit (INDEPENDENT_AMBULATORY_CARE_PROVIDER_SITE_OTHER): Payer: 59

## 2019-03-08 DIAGNOSIS — E559 Vitamin D deficiency, unspecified: Secondary | ICD-10-CM

## 2019-03-08 DIAGNOSIS — Z7901 Long term (current) use of anticoagulants: Secondary | ICD-10-CM | POA: Diagnosis not present

## 2019-03-08 LAB — POCT INR: INR: 3.2 — AB (ref 2.0–3.0)

## 2019-03-08 NOTE — Progress Notes (Signed)
Medical screening examination/treatment/procedure(s) were performed by non-physician practitioner and as supervising physician I was immediately available for consultation/collaboration. I agree with above. Lakeidra Reliford, MD   

## 2019-03-08 NOTE — Patient Instructions (Addendum)
Pre visit review using our clinic review tool, if applicable. No additional management support is needed unless otherwise documented below in the visit note.  Take 1 tablets (5mg ) tomorrow and then continue to take 2 tablets daily except 1 tablet on Sun Tues and Thursday.  Re-check in 6 weeks.

## 2019-03-09 LAB — VITAMIN D 25 HYDROXY (VIT D DEFICIENCY, FRACTURES): Vit D, 25-Hydroxy: 26.7 ng/mL — ABNORMAL LOW (ref 30.0–100.0)

## 2019-04-06 ENCOUNTER — Telehealth: Payer: Self-pay | Admitting: General Practice

## 2019-04-06 NOTE — Telephone Encounter (Signed)
Instructions for coumadin and Lovenox pre and post knee replacement on 4/5.  3/31 - Last dose of coumadin until after surgery 4/1 - Nothing (No coumadin and No Lovenox) 4/2 - Lovenox in the AM 4/3 - Lovenox in the AM 4/4 - Lovenox in the AM (Please take by 7 am) 4/5 - Procedure (DO NOT TAKE LOVENOX TODAY) 4/6 - Lovenox in the AM and 3 tablets of coumadin 4/7 - Lovenox in the AM and 3 tablets of coumadin 4/8 - Lovenox in the AM and 3 tablets of coumadin 4/9 - Lovenox in the AM and 2 tablets of coumadin 4/10 - Stop Lovenox and resume coumadin 1 tablet daily except 2 tablets on Mon Wed and Fri.  Check INR on 4/20.

## 2019-04-06 NOTE — Telephone Encounter (Signed)
-----   Message from Hoyt Koch, MD sent at 04/06/2019 10:58 AM EST ----- Regarding: RE: Lovenox bridge Jenny Reichmann,  She is mine not [employer] but yes please order and dose lovenox.  Thanks, Delma Officer ----- Message ----- From: Binnie Rail, MD Sent: 04/06/2019  10:34 AM EST To: Hoyt Koch, MD Subject: Melton Alar: Lovenox bridge                              ----- Message ----- From: Warden Fillers, RN Sent: 04/06/2019   9:05 AM EST To: Binnie Rail, MD Subject: Lovenox bridge                                 Hi Dr. Quay Burow,  Patient is having her knee replaced in early April.  She will need to hold warfarin for 5 days. Pt will need to bridge with Lovenox.  Do I have your permission to dose and order the Lovenox?  Please advise.  Thanks, Villa Herb, RN

## 2019-04-07 ENCOUNTER — Other Ambulatory Visit: Payer: Self-pay | Admitting: General Practice

## 2019-04-07 DIAGNOSIS — Z7901 Long term (current) use of anticoagulants: Secondary | ICD-10-CM

## 2019-04-07 MED ORDER — ENOXAPARIN SODIUM 150 MG/ML ~~LOC~~ SOLN: 150.0000 mg | SUBCUTANEOUS | 0 refills | Status: DC

## 2019-04-13 ENCOUNTER — Other Ambulatory Visit: Payer: Self-pay | Admitting: General Practice

## 2019-04-13 DIAGNOSIS — Z7901 Long term (current) use of anticoagulants: Secondary | ICD-10-CM

## 2019-04-13 MED ORDER — ENOXAPARIN SODIUM 150 MG/ML ~~LOC~~ SOLN: 150.0000 mg | SUBCUTANEOUS | 0 refills | Status: DC

## 2019-04-19 ENCOUNTER — Other Ambulatory Visit: Payer: Self-pay

## 2019-04-19 ENCOUNTER — Ambulatory Visit (INDEPENDENT_AMBULATORY_CARE_PROVIDER_SITE_OTHER): Payer: 59 | Admitting: General Practice

## 2019-04-19 DIAGNOSIS — Z7901 Long term (current) use of anticoagulants: Secondary | ICD-10-CM

## 2019-04-19 LAB — POCT INR: INR: 2.5 (ref 2.0–3.0)

## 2019-04-19 NOTE — Patient Instructions (Signed)
Pre visit review using our clinic review tool, if applicable. No additional management support is needed unless otherwise documented below in the visit note.  Continue to take 2 tablets daily except 1 tablet on Sun, Tues and Thurs.  Please follow patient instructions for coumadin and Lovenox.  Re-check on 4/20.   Instructions for coumadin and Lovenox pre and post knee replacement on 4/5.  3/31 - Last dose of coumadin until after surgery 4/1 - Nothing (No coumadin and No Lovenox) 4/2 - Lovenox in the AM 4/3 - Lovenox in the AM 4/4 - Lovenox in the AM (Please take by 7 am) 4/5 - Procedure (DO NOT TAKE LOVENOX TODAY) 4/6 - Lovenox in the AM and 3 tablets of coumadin 4/7 - Lovenox in the AM and 3 tablets of coumadin 4/8 - Lovenox in the AM and 3 tablets of coumadin 4/9 - Lovenox in the AM and 2 tablets of coumadin 4/10 - Stop Lovenox and resume coumadin 1 tablet daily except 2 tablets on Mon Wed and Fri.  Check INR on 4/20.

## 2019-04-20 ENCOUNTER — Encounter: Payer: Self-pay | Admitting: Certified Nurse Midwife

## 2019-04-20 NOTE — H&P (Signed)
TOTAL KNEE ADMISSION H&P  Patient is being admitted for right total knee arthroplasty.  Subjective:  Chief Complaint:right knee pain.  HPI: Megan Parrish, 64 y.o. female, has a history of pain and functional disability in the right knee due to arthritis and has failed non-surgical conservative treatments for greater than 12 weeks to includecorticosteriod injections, viscosupplementation injections and activity modification.  Onset of symptoms was gradual, starting 1 years ago with gradually worsening course since that time. The patient noted no past surgery on the right knee(s).  Patient currently rates pain in the right knee(s) at 8 out of 10 with activity. Patient has worsening of pain with activity and weight bearing and pain that interferes with activities of daily living.  Patient has evidence of joint space narrowing by imaging studies.There is no active infection.  Patient Active Problem List   Diagnosis Date Noted  . Osteoarthritis of knee 01/14/2018  . Bilateral knee pain 11/19/2017  . Degenerative arthritis of knee, bilateral 12/15/2016  . Routine general medical examination at a health care facility 06/23/2014  . Obese   . Hyperlipidemia 08/22/2010  . DVT, recurrent, lower extremity, chronic 03/11/2010  . THYROID NODULE 02/12/2009  . Asthma 02/09/2009  . GERD 02/09/2009   Past Medical History:  Diagnosis Date  . ALLERGIC RHINITIS   . Anxiety state, unspecified   . ASTHMA   . Asthma   . 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 Factor V deficiency (HCC)    chronic anticoag   . Fibroid   . GERD   . Palpitations   . Plantar fasciitis 2017  . THYROID NODULE 08/2008    Past Surgical History:  Procedure Laterality Date  . NO PAST SURGERIES    . perianal cyst removal  2001  . TOTAL KNEE ARTHROPLASTY Left 01/31/2019   Procedure: TOTAL KNEE ARTHROPLASTY;  Surgeon: Gaynelle Arabian, MD;  Location: [facility];  Service: Orthopedics;   Laterality: Left;  32min  . WISDOM TOOTH EXTRACTION Left 11/30/2018    No current facility-administered medications for this encounter.   Current Outpatient Medications  Medication Sig Dispense Refill Last Dose  . albuterol (PROAIR HFA) 108 (90 Base) MCG/ACT inhaler Inhale 2 puffs into the lungs every 4 (four) hours as needed. 18 g 3   . [START ON 04/29/2019] enoxaparin (LOVENOX) 150 MG/ML injection Inject 1 mL (150 mg total) into the skin daily. 7 mL 0   . esomeprazole (NEXIUM) 40 MG capsule Take 40 mg by mouth daily at 12 noon.     . gabapentin (NEURONTIN) 300 MG capsule Take 1 capsule (300 mg total) by mouth 3 (three) times daily. for two weeks, Then a 300 mg capsule twice a day for two weeks, Then a 300 mg capsule once a day for two weeks, then discontinue the Gabapentin. 84 capsule 0   . methocarbamol (ROBAXIN) 500 MG tablet Take 1 tablet (500 mg total) by mouth every 6 (six) hours as needed for muscle spasms. 40 tablet 0   . ondansetron (ZOFRAN) 4 MG tablet Take 1 tablet (4 mg total) by mouth every 8 (eight) hours as needed for nausea or vomiting. 40 tablet 0   . triamcinolone cream (KENALOG) 0.1 % Apply 1 application topically 2 (two) times daily. 80 g 3   . VITAMIN D PO Take 2,000 Units by mouth daily.      Marland Kitchen warfarin (JANTOVEN) 5 MG tablet TAKE 3 TABLETS Tuesday January 5TH- Thursday January  7TH THEN RESUME REGULAR SCHEDULE AS DIRECTED BY ANTICOAGULATION CLINIC 142 tablet 1    Allergies  Allergen Reactions  . Codeine Nausea And Vomiting  . Other Hives and Other (See Comments)    Animal Dander: Respiratory distress    Social History   Tobacco Use  . Smoking status: Former Smoker    Packs/day: 1.00    Years: 30.00    Pack years: 30.00    Types: Cigarettes    Quit date: 04/13/2008    Years since quitting: 11.0  . Smokeless tobacco: Never Used  . Tobacco comment: single, lives alone 1 dog and 3 cats. 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  Alcohol use: Yes    Alcohol/week: 0.0 standard drinks    Comment: [place]    Family History  Problem Relation Age of Onset  . Dementia Father   . Hypertension Father   . Hypertension Mother   . Thyroid disease Mother        parathyroid cyst removed  . Endocrine tumor Sister   . Leukemia Sister   . Arthritis Other        Parent  . Uterine cancer Other        grandparent  . Ovarian cancer Maternal Grandmother      Review of Systems  Constitutional: Negative for chills and fever.  Respiratory: Negative for cough and shortness of breath.   Gastrointestinal: Negative for nausea and vomiting.  Musculoskeletal: Positive for arthralgias.    Objective:  Physical Exam Patient is a 64 year old female.  Well nourished and well developed. General: Alert and oriented x3, cooperative and pleasant, no acute distress. Head: normocephalic, atraumatic, neck supple. Eyes: EOMI. Respiratory: breath sounds clear in all fields, no wheezing, rales, or rhonchi. Cardiovascular: Regular rate and rhythm, no murmurs, gallops or rubs. Abdomen: non-tender to palpation and soft, normoactive bowel sounds.  Musculoskeletal: Right Knee Exam: Minimal varus deformity. No effusion present. No swelling present. The range of motion is 0 to 125 degrees. Moderate crepitus on range of motion of the knee. Tenderness present in the medial greater than lateral joint lines. The knee is stable.  Calves soft and nontender. Motor function intact in LE. Strength 5/5 LE bilaterally. Neuro: Distal pulses 2+. Sensation to light touch intact in LE.  Vital signs in last 24 hours:  Labs:  Estimated body mass index is 36.71 kg/m as calculated from the following:   Height as of 01/31/19: 5\' 8"  (1.727 m).   Weight as of 01/31/19: 109.5 kg.   Imaging Review Plain radiographs demonstrate severe degenerative joint disease of the right knee(s). The overall alignment isneutral. The bone quality appears to be adequate for  age and reported activity level.  Assessment/Plan:  End stage arthritis, right knee   The patient history, physical examination, clinical judgment of the provider and imaging studies are consistent with end stage degenerative joint disease of the right knee(s) and total knee arthroplasty is deemed medically necessary. The treatment options including medical management, injection therapy arthroscopy and arthroplasty were discussed at length. The risks and benefits of total knee arthroplasty were presented and reviewed. The risks due to aseptic loosening, infection, stiffness, patella tracking problems, thromboembolic complications and other imponderables were discussed. The patient acknowledged the explanation, agreed to proceed with the plan and consent was signed. Patient is being admitted for inpatient treatment for surgery, pain control, PT, OT, prophylactic antibiotics, VTE prophylaxis, progressive ambulation and ADL's and discharge planning. The patient is planning  to be discharged home.  Therapy Plans: outpatient therapy at [facility] in [place] Disposition: Home with sister in law and sister Planned DVT Prophylaxis: Coumadin (hx of DVT) will bridge with Lovenox DME needed: none PCP: Dr. [name] Coumadin/Lovenox TXA: topical Allergies: codeine - nausea and vomiting Anesthesia Concerns: none BMI: 34.3 Not diabetic.  Other: Planning for same day discharge. Hx of DVT, on Coumadin. Did well with Oxycodone and all meds from recent left TKA.  Coumadin 10 mg M/W/F/S and Coumadin 5 mg on S/T/Th at baseline  **COUMADIN INSTRUCTIONS**  Instructions for coumadin and Lovenox pre and post knee replacement on 4/5.  3/31 - Last dose of coumadin until after surgery 4/1 - Nothing (No coumadin and No Lovenox) 4/2 - Lovenox in the AM 4/3 - Lovenox in the AM 4/4 - Lovenox in the AM (Please take by 7 am) 4/5 - Procedure (DO NOT TAKE LOVENOX TODAY) 4/6 - Lovenox in the AM and 3 tablets of  coumadin 4/7 - Lovenox in the AM and 3 tablets of coumadin 4/8 - Lovenox in the AM and 3 tablets of coumadin 4/9 - Lovenox in the AM and 2 tablets of coumadin 4/10 - Stop Lovenox and resume coumadin 1 tablet daily except 2 tablets on Mon Wed and Fri.  Check INR on 4/20.   Patient's anticipated LOS is less than 2 midnights, meeting these requirements: - Younger than 34 - Lives within 1 hour of care - Has a competent adult at home to recover with post-op recover - NO history of  - Chronic pain requiring opiods  - Diabetes  - Coronary Artery Disease  - Heart failure  - Heart attack  - Stroke  - DVT/VTE  - Cardiac arrhythmia  - Respiratory Failure/COPD  - Renal failure  - Anemia  - Advanced Liver disease  - Patient was instructed on what medications to stop prior to surgery. - Follow-up visit in 2 weeks with Dr. Wynelle Link - Begin physical therapy following surgery - Pre-operative lab work as pre-surgical testing - Prescriptions will be provided in hospital at time of discharge  Griffith Citron, PA-C Orthopedic Surgery [facility] [phone]

## 2019-04-25 NOTE — Progress Notes (Signed)
Medical screening examination/treatment/procedure(s) were performed by non-physician practitioner and as supervising physician I was immediately available for consultation/collaboration. I agree with above. Margaretta Chittum, MD   

## 2019-04-26 NOTE — Patient Instructions (Addendum)
DUE TO COVID-19 ONLY ONE VISITOR IS ALLOWED TO COME WITH YOU AND STAY IN THE WAITING ROOM ONLY DURING PRE OP AND PROCEDURE DAY OF SURGERY. THE 1 VISITOR MAY VISIT WITH YOU AFTER SURGERY IN YOUR PRIVATE ROOM DURING VISITING HOURS ONLY!  YOU NEED TO HAVE A COVID 19 TEST ON: 04/28/19 @ 9:00 am  , THIS TEST MUST BE DONE BEFORE SURGERY, COME  [place], [place] , [address].  ([place]) ONCE YOUR COVID TEST IS COMPLETED, PLEASE BEGIN THE QUARANTINE INSTRUCTIONS AS OUTLINED IN YOUR HANDOUT.                LOLANDA PETROVSKI    Your procedure is scheduled on: 05/02/19   Report to [facility] Main  Entrance   Report to admitting at: 6:00 AM     Call this number if you have problems the morning of surgery [phone]    Remember:  [place], NO [place].     Take these medicines the morning of surgery with A SIP OF WATER: esomeprazole,gabapentin.Use inhalers as usual.                                 You may not have any metal on your body including hair pins and              piercings  Do not wear jewelry, make-up, lotions, powders or perfumes, deodorant             Do not wear nail polish on your fingernails.  Do not shave  48 hours prior to surgery.              Do not bring valuables to the hospital. [place].  Contacts, dentures or bridgework may not be worn into surgery.  Leave suitcase in the car. After surgery it may be brought to your room.     Patients discharged the day of surgery will not be allowed to drive home. IF YOU ARE HAVING SURGERY AND GOING HOME THE SAME DAY, YOU MUST HAVE AN ADULT TO DRIVE YOU HOME AND BE WITH YOU FOR 24 HOURS. YOU MAY GO HOME BY TAXI OR UBER OR ORTHERWISE, BUT AN ADULT MUST ACCOMPANY YOU HOME AND STAY WITH YOU FOR 24 HOURS.  Name and phone number of your driver:  Special Instructions: N/A               Please read over the following fact sheets you were given: _____________________________________________________________________             NO SOLID FOOD AFTER MIDNIGHT THE NIGHT PRIOR TO SURGERY. NOTHING BY MOUTH EXCEPT CLEAR LIQUIDS UNTIL: 5:00 am . PLEASE FINISH ENSURE DRINK PER SURGEON ORDER  WHICH NEEDS TO BE COMPLETED AT: 5:00 am .   CLEAR LIQUID DIET   Foods Allowed                                                                     Foods Excluded  Coffee and tea, regular and decaf                             liquids that you cannot  Plain Jell-O any favor except red or purple                                           see through such as: Fruit ices (not with fruit pulp)                                     milk, soups, orange juice  Iced Popsicles                                    All solid food Carbonated beverages, regular and diet                                    Cranberry, grape and apple juices Sports drinks like Gatorade Lightly seasoned clear broth or consume(fat free) Sugar, honey syrup  Sample Menu Breakfast                                Lunch                                     Supper Cranberry juice                    Beef broth                            Chicken broth Jell-O                                     Grape juice                           Apple juice Coffee or tea                        Jell-O                                      Popsicle                                                Coffee or tea                        Coffee or tea  _____________________________________________________________________  [facility] - Preparing for Surgery Before surgery, you can play an important role.  Because skin is not sterile, your skin needs to be  as free of germs as possible.  You can reduce the number of germs on your skin by washing with CHG (chlorahexidine gluconate) soap before surgery.  CHG is an antiseptic cleaner which kills germs and bonds with the  skin to continue killing germs even after washing. Please DO NOT use if you have an allergy to CHG or antibacterial soaps.  If your skin becomes reddened/irritated stop using the CHG and inform your nurse when you arrive at Short Stay. Do not shave (including legs and underarms) for at least 48 hours prior to the first CHG shower.  You may shave your face/neck. Please follow these instructions carefully:  1.  Shower with CHG Soap the night before surgery and the  morning of Surgery.  2.  If you choose to wash your hair, wash your hair first as usual with your  normal  shampoo.  3.  After you shampoo, rinse your hair and body thoroughly to remove the  shampoo.                           4.  Use CHG as you would any other liquid soap.  You can apply chg directly  to the skin and wash                       Gently with a scrungie or clean washcloth.  5.  Apply the CHG Soap to your body ONLY FROM THE NECK DOWN.   Do not use on face/ open                           Wound or open sores. Avoid contact with eyes, ears mouth and genitals (private parts).                       Wash face,  Genitals (private parts) with your normal soap.             6.  Wash thoroughly, paying special attention to the area where your surgery  will be performed.  7.  Thoroughly rinse your body with warm water from the neck down.  8.  DO NOT shower/wash with your normal soap after using and rinsing off  the CHG Soap.                9.  Pat yourself dry with a clean towel.            10.  Wear clean pajamas.            11.  Place clean sheets on your bed the night of your first shower and do not  sleep with pets. Day of Surgery : Do not apply any lotions/deodorants the morning of surgery.  Please wear clean clothes to the hospital/surgery center.  FAILURE TO FOLLOW THESE INSTRUCTIONS MAY RESULT IN THE CANCELLATION OF YOUR SURGERY PATIENT SIGNATURE_________________________________  NURSE  SIGNATURE__________________________________  ________________________________________________________________________   Adam Phenix  An incentive spirometer is a tool that can help keep your lungs clear and active. This tool measures how well you are filling your lungs with each breath. Taking long deep breaths may help reverse or decrease the chance of developing breathing (pulmonary) problems (especially infection) following:  A long period of time when you are unable to move or be active. BEFORE THE PROCEDURE   If the spirometer includes an indicator to show your best  effort, your nurse or respiratory therapist will set it to a desired goal.  If possible, sit up straight or lean slightly forward. Try not to slouch.  Hold the incentive spirometer in an upright position. INSTRUCTIONS FOR USE  1. Sit on the edge of your bed if possible, or sit up as far as you can in bed or on a chair. 2. Hold the incentive spirometer in an upright position. 3. Breathe out normally. 4. Place the mouthpiece in your mouth and seal your lips tightly around it. 5. Breathe in slowly and as deeply as possible, raising the piston or the ball toward the top of the column. 6. Hold your breath for 3-5 seconds or for as long as possible. Allow the piston or ball to fall to the bottom of the column. 7. Remove the mouthpiece from your mouth and breathe out normally. 8. Rest for a few seconds and repeat Steps 1 through 7 at least 10 times every 1-2 hours when you are awake. Take your time and take a few normal breaths between deep breaths. 9. The spirometer may include an indicator to show your best effort. Use the indicator as a goal to work toward during each repetition. 10. After each set of 10 deep breaths, practice coughing to be sure your lungs are clear. If you have an incision (the cut made at the time of surgery), support your incision when coughing by placing a pillow or rolled up towels firmly  against it. Once you are able to get out of bed, walk around indoors and cough well. You may stop using the incentive spirometer when instructed by your caregiver.  RISKS AND COMPLICATIONS  Take your time so you do not get dizzy or light-headed.  If you are in pain, you may need to take or ask for pain medication before doing incentive spirometry. It is harder to take a deep breath if you are having pain. AFTER USE  Rest and breathe slowly and easily.  It can be helpful to keep track of a log of your progress. Your caregiver can provide you with a simple table to help with this. If you are using the spirometer at home, follow these instructions: [place] IF:   You are having difficultly using the spirometer.  You have trouble using the spirometer as often as instructed.  Your pain medication is not giving enough relief while using the spirometer.  You develop fever of 100.5 F (38.1 C) or higher. SEEK IMMEDIATE MEDICAL CARE IF:   You cough up bloody sputum that had not been present before.  You develop fever of 102 F (38.9 C) or greater.  You develop worsening pain at or near the incision site. MAKE SURE YOU:   Understand these instructions.  Will watch your condition.  Will get help right away if you are not doing well or get worse. Document Released: 05/26/2006 Document Revised: 04/07/2011 Document Reviewed: 07/27/2006 ExitCare Patient Information 2014 ExitCare, [place].   ________________________________________________________________________  WHAT IS A BLOOD TRANSFUSION? Blood Transfusion Information  A transfusion is the replacement of blood or some of its parts. Blood is made up of multiple cells which provide different functions.  Red blood cells carry oxygen and are used for blood loss replacement.  White blood cells fight against infection.  Platelets control bleeding.  Plasma helps clot blood.  Other blood products are available for  specialized needs, such as hemophilia or other clotting disorders. BEFORE THE TRANSFUSION  Who gives blood for transfusions?  Healthy volunteers who are fully evaluated to make sure their blood is safe. This is blood bank blood. Transfusion therapy is the safest it has ever been in the practice of medicine. Before blood is taken from a donor, a complete history is taken to make sure that person has no history of diseases nor engages in risky social behavior (examples are intravenous drug use or sexual activity with multiple partners). The donor's travel history is screened to minimize risk of transmitting infections, such as malaria. The donated blood is tested for signs of infectious diseases, such as HIV and hepatitis. The blood is then tested to be sure it is compatible with you in order to minimize the chance of a transfusion reaction. If you or a relative donates blood, this is often done in anticipation of surgery and is not appropriate for emergency situations. It takes many days to process the donated blood. RISKS AND COMPLICATIONS Although transfusion therapy is very safe and saves many lives, the main dangers of transfusion include:   Getting an infectious disease.  Developing a transfusion reaction. This is an allergic reaction to something in the blood you were given. Every precaution is taken to prevent this. The decision to have a blood transfusion has been considered carefully by your caregiver before blood is given. Blood is not given unless the benefits outweigh the risks. AFTER THE TRANSFUSION  Right after receiving a blood transfusion, you will usually feel much better and more energetic. This is especially true if your red blood cells have gotten low (anemic). The transfusion raises the level of the red blood cells which carry oxygen, and this usually causes an energy increase.  The nurse administering the transfusion will monitor you carefully for complications. HOME CARE  INSTRUCTIONS  No special instructions are needed after a transfusion. You may find your energy is better. Speak with your caregiver about any limitations on activity for underlying diseases you may have. SEEK MEDICAL CARE IF:   Your condition is not improving after your transfusion.  You develop redness or irritation at the intravenous (IV) site. SEEK IMMEDIATE MEDICAL CARE IF:  Any of the following symptoms occur over the next 12 hours:  Shaking chills.  You have a temperature by mouth above 102 F (38.9 C), not controlled by medicine.  Chest, back, or muscle pain.  People around you feel you are not acting correctly or are confused.  Shortness of breath or difficulty breathing.  Dizziness and fainting.  You get a rash or develop hives.  You have a decrease in urine output.  Your urine turns a dark color or changes to pink, red, or brown. Any of the following symptoms occur over the next 10 days:  You have a temperature by mouth above 102 F (38.9 C), not controlled by medicine.  Shortness of breath.  Weakness after normal activity.  The white part of the eye turns yellow (jaundice).  You have a decrease in the amount of urine or are urinating less often.  Your urine turns a dark color or changes to pink, red, or brown. Document Released: 01/11/2000 Document Revised: 04/07/2011 Document Reviewed: 08/30/2007 [facility] Patient Information 2014 [place], [place].  _______________________________________________________________________

## 2019-04-27 ENCOUNTER — Encounter (HOSPITAL_COMMUNITY)
Admission: RE | Admit: 2019-04-27 | Discharge: 2019-04-27 | Disposition: A | Discharge status: Home / Self Care | Payer: 59 | Source: Ambulatory Visit | Attending: Orthopedic Surgery | Admitting: Orthopedic Surgery

## 2019-04-27 ENCOUNTER — Encounter (HOSPITAL_COMMUNITY): Payer: Self-pay

## 2019-04-27 ENCOUNTER — Other Ambulatory Visit: Payer: Self-pay

## 2019-04-27 DIAGNOSIS — Z20822 Contact with and (suspected) exposure to covid-19: Secondary | ICD-10-CM | POA: Diagnosis not present

## 2019-04-27 DIAGNOSIS — Z01812 Encounter for preprocedural laboratory examination: Secondary | ICD-10-CM | POA: Diagnosis not present

## 2019-04-27 HISTORY — DX: Unspecified osteoarthritis, unspecified site: M19.90

## 2019-04-27 NOTE — Progress Notes (Signed)
PCP - Dr. Pricilla Holm. LOV: 12/03/18 Cardiologist -   Chest x-ray -  EKG - 01/25/19 Stress Test -  ECHO -  Cardiac Cath -   Sleep Study -  CPAP -   Fasting Blood Sugar -  Checks Blood Sugar _____ times a day  Blood Thinner Instructions: Warfarin. Last dose: 04/27/19. Instructions by Dr. [name]. Aspirin Instructions: Last Dose:  Anesthesia review:   Patient denies shortness of breath, fever, cough and chest pain at PAT appointment   Patient verbalized understanding of instructions that were given to them at the PAT appointment. Patient was also instructed that they will need to review over the PAT instructions again at home before surgery.

## 2019-04-28 ENCOUNTER — Other Ambulatory Visit (HOSPITAL_COMMUNITY)
Admission: RE | Admit: 2019-04-28 | Discharge: 2019-04-28 | Disposition: A | Discharge status: Home / Self Care | Payer: 59 | Source: Ambulatory Visit | Attending: Orthopedic Surgery | Admitting: Orthopedic Surgery

## 2019-04-28 ENCOUNTER — Encounter (HOSPITAL_COMMUNITY)
Admission: RE | Admit: 2019-04-28 | Discharge: 2019-04-28 | Disposition: A | Discharge status: Home / Self Care | Payer: 59 | Source: Ambulatory Visit | Attending: Orthopedic Surgery | Admitting: Orthopedic Surgery

## 2019-04-28 DIAGNOSIS — Z01812 Encounter for preprocedural laboratory examination: Secondary | ICD-10-CM | POA: Diagnosis not present

## 2019-04-28 LAB — COMPREHENSIVE METABOLIC PANEL
ALT: 20 U/L (ref 0–44)
AST: 18 U/L (ref 15–41)
Albumin: 4.2 g/dL (ref 3.5–5.0)
Alkaline Phosphatase: 87 U/L (ref 38–126)
Anion gap: 9 (ref 5–15)
BUN: 15 mg/dL (ref 8–23)
CO2: 28 mmol/L (ref 22–32)
Calcium: 9.3 mg/dL (ref 8.9–10.3)
Chloride: 102 mmol/L (ref 98–111)
Creatinine, Ser: 0.81 mg/dL (ref 0.44–1.00)
GFR calc Af Amer: >60 mL/min (ref >60)
GFR calc non Af Amer: >60 mL/min (ref >60)
Glucose, Bld: 102 mg/dL — ABNORMAL HIGH (ref 70–99)
Potassium: 4.6 mmol/L (ref 3.5–5.1)
Sodium: 139 mmol/L (ref 135–145)
Total Bilirubin: 1.1 mg/dL (ref 0.3–1.2)
Total Protein: 7 g/dL (ref 6.5–8.1)

## 2019-04-28 LAB — SARS CORONAVIRUS 2 (TAT 6-24 HRS): SARS Coronavirus 2: NEGATIVE

## 2019-04-28 LAB — SURGICAL PCR SCREEN
MRSA, PCR: NEGATIVE
Staphylococcus aureus: NEGATIVE

## 2019-04-28 LAB — CBC
HCT: 45.9 % (ref 36.0–46.0)
Hemoglobin: 14.8 g/dL (ref 12.0–15.0)
MCH: 29.7 pg (ref 26.0–34.0)
MCHC: 32.2 g/dL (ref 30.0–36.0)
MCV: 92 fL (ref 80.0–100.0)
Platelets: 240 10*3/uL (ref 150–400)
RBC: 4.99 MIL/uL (ref 3.87–5.11)
RDW: 13.1 % (ref 11.5–15.5)
WBC: 6.3 10*3/uL (ref 4.0–10.5)
nRBC: 0 % (ref 0.0–0.2)

## 2019-04-28 LAB — PROTIME-INR
INR: 2.2 — ABNORMAL HIGH (ref 0.8–1.2)
Prothrombin Time: 23.9 seconds — ABNORMAL HIGH (ref 11.4–15.2)

## 2019-04-28 LAB — APTT: aPTT: 37 seconds — ABNORMAL HIGH (ref 24–36)

## 2019-04-28 NOTE — Progress Notes (Signed)
Lab results: PT: 23.9 INR: 2.2 PTT: 37

## 2019-05-01 MED ORDER — BUPIVACAINE LIPOSOME 1.3 % IJ SUSP
20.0000 mL | Freq: Once | INTRAMUSCULAR | Status: DC
  Filled 2019-05-01: qty 20

## 2019-05-02 ENCOUNTER — Encounter (HOSPITAL_COMMUNITY): Payer: Self-pay | Admitting: Orthopedic Surgery

## 2019-05-02 ENCOUNTER — Ambulatory Visit (HOSPITAL_COMMUNITY): Payer: No Typology Code available for payment source | Admitting: Anesthesiology

## 2019-05-02 ENCOUNTER — Encounter (HOSPITAL_COMMUNITY)
Admission: RE | Disposition: A | Discharge status: Home / Self Care | Payer: Self-pay | Source: Other Acute Inpatient Hospital | Attending: Orthopedic Surgery

## 2019-05-02 ENCOUNTER — Ambulatory Visit (HOSPITAL_COMMUNITY)
Admission: RE | Admit: 2019-05-02 | Discharge: 2019-05-02 | Disposition: A | Discharge status: Home / Self Care | Payer: No Typology Code available for payment source | Source: Other Acute Inpatient Hospital | Attending: Orthopedic Surgery | Admitting: Orthopedic Surgery

## 2019-05-02 DIAGNOSIS — E669 Obesity, unspecified: Secondary | ICD-10-CM | POA: Diagnosis not present

## 2019-05-02 DIAGNOSIS — Z79899 Other long term (current) drug therapy: Secondary | ICD-10-CM | POA: Insufficient documentation

## 2019-05-02 DIAGNOSIS — E785 Hyperlipidemia, unspecified: Secondary | ICD-10-CM | POA: Insufficient documentation

## 2019-05-02 DIAGNOSIS — Z86718 Personal history of other venous thrombosis and embolism: Secondary | ICD-10-CM | POA: Insufficient documentation

## 2019-05-02 DIAGNOSIS — M1710 Unilateral primary osteoarthritis, unspecified knee: Secondary | ICD-10-CM | POA: Diagnosis present

## 2019-05-02 DIAGNOSIS — M179 Osteoarthritis of knee, unspecified: Secondary | ICD-10-CM | POA: Diagnosis present

## 2019-05-02 DIAGNOSIS — K219 Gastro-esophageal reflux disease without esophagitis: Secondary | ICD-10-CM | POA: Diagnosis not present

## 2019-05-02 DIAGNOSIS — M1711 Unilateral primary osteoarthritis, right knee: Secondary | ICD-10-CM | POA: Diagnosis present

## 2019-05-02 DIAGNOSIS — Z96652 Presence of left artificial knee joint: Secondary | ICD-10-CM | POA: Diagnosis not present

## 2019-05-02 DIAGNOSIS — D682 Hereditary deficiency of other clotting factors: Secondary | ICD-10-CM | POA: Insufficient documentation

## 2019-05-02 DIAGNOSIS — F419 Anxiety disorder, unspecified: Secondary | ICD-10-CM | POA: Insufficient documentation

## 2019-05-02 DIAGNOSIS — Z87891 Personal history of nicotine dependence: Secondary | ICD-10-CM | POA: Insufficient documentation

## 2019-05-02 DIAGNOSIS — Z7901 Long term (current) use of anticoagulants: Secondary | ICD-10-CM | POA: Diagnosis not present

## 2019-05-02 DIAGNOSIS — Z6834 Body mass index (BMI) 34.0-34.9, adult: Secondary | ICD-10-CM | POA: Insufficient documentation

## 2019-05-02 HISTORY — PX: TOTAL KNEE ARTHROPLASTY: SHX125

## 2019-05-02 LAB — TYPE AND SCREEN
ABO/RH(D): O POS
Antibody Screen: NEGATIVE

## 2019-05-02 SURGERY — ARTHROPLASTY, KNEE, TOTAL
Anesthesia: Spinal | Site: Knee | Laterality: Right

## 2019-05-02 MED ORDER — BUPIVACAINE LIPOSOME 1.3 % IJ SUSP
INTRAMUSCULAR | Status: DC | PRN
  Administered 2019-05-02: 20 mL

## 2019-05-02 MED ORDER — PROPOFOL 1000 MG/100ML IV EMUL
INTRAVENOUS | Status: AC
  Filled 2019-05-02: qty 100

## 2019-05-02 MED ORDER — PHENYLEPHRINE HCL-NACL 10-0.9 MG/250ML-% IV SOLN
INTRAVENOUS | Status: AC
  Filled 2019-05-02: qty 500

## 2019-05-02 MED ORDER — DEXAMETHASONE SODIUM PHOSPHATE 10 MG/ML IJ SOLN
INTRAMUSCULAR | Status: DC | PRN
  Administered 2019-05-02: 8 mg via INTRAVENOUS

## 2019-05-02 MED ORDER — MIDAZOLAM HCL 2 MG/2ML IJ SOLN
1.0000 mg | INTRAMUSCULAR | Status: DC
  Administered 2019-05-02: 08:00:00 1 mg via INTRAVENOUS
  Filled 2019-05-02: qty 2

## 2019-05-02 MED ORDER — TRANEXAMIC ACID 1000 MG/10ML IV SOLN
INTRAVENOUS | Status: DC | PRN
  Administered 2019-05-02: 2000 mg via TOPICAL

## 2019-05-02 MED ORDER — ONDANSETRON HCL 4 MG/2ML IJ SOLN: 4.0000 mg | Freq: Four times a day (QID) | INTRAMUSCULAR | Status: DC | PRN

## 2019-05-02 MED ORDER — TRANEXAMIC ACID-NACL 1000-0.7 MG/100ML-% IV SOLN
INTRAVENOUS | Status: AC
  Filled 2019-05-02: qty 100

## 2019-05-02 MED ORDER — LACTATED RINGERS IV BOLUS: 250.0000 mL | Freq: Once | INTRAVENOUS | Status: DC

## 2019-05-02 MED ORDER — FENTANYL CITRATE (PF) 100 MCG/2ML IJ SOLN
50.0000 ug | INTRAMUSCULAR | Status: DC
  Administered 2019-05-02: 08:00:00 50 ug via INTRAVENOUS
  Filled 2019-05-02: qty 2

## 2019-05-02 MED ORDER — SODIUM CHLORIDE (PF) 0.9 % IJ SOLN
INTRAMUSCULAR | Status: AC
  Filled 2019-05-02: qty 10

## 2019-05-02 MED ORDER — STERILE WATER FOR IRRIGATION IR SOLN
Status: DC | PRN
  Administered 2019-05-02: 2000 mL

## 2019-05-02 MED ORDER — SODIUM CHLORIDE (PF) 0.9 % IJ SOLN
INTRAMUSCULAR | Status: DC | PRN
  Administered 2019-05-02: 60 mL

## 2019-05-02 MED ORDER — MEPIVACAINE HCL (PF) 2 % IJ SOLN
INTRAMUSCULAR | Status: AC
  Filled 2019-05-02: qty 20

## 2019-05-02 MED ORDER — FENTANYL CITRATE (PF) 100 MCG/2ML IJ SOLN: 25.0000 ug | INTRAMUSCULAR | Status: DC | PRN

## 2019-05-02 MED ORDER — SODIUM CHLORIDE (PF) 0.9 % IJ SOLN
INTRAMUSCULAR | Status: AC
  Filled 2019-05-02: qty 50

## 2019-05-02 MED ORDER — MEPIVACAINE HCL (PF) 2 % IJ SOLN
INTRAMUSCULAR | Status: DC | PRN
  Administered 2019-05-02: 60 mg via INTRATHECAL

## 2019-05-02 MED ORDER — CEFAZOLIN SODIUM-DEXTROSE 2-4 GM/100ML-% IV SOLN
2.0000 g | INTRAVENOUS | Status: AC
  Administered 2019-05-02: 2 g via INTRAVENOUS
  Filled 2019-05-02: qty 100

## 2019-05-02 MED ORDER — PROPOFOL 500 MG/50ML IV EMUL
INTRAVENOUS | Status: AC
  Filled 2019-05-02: qty 50

## 2019-05-02 MED ORDER — OXYCODONE HCL 5 MG/5ML PO SOLN: 5.0000 mg | Freq: Once | ORAL | Status: AC | PRN

## 2019-05-02 MED ORDER — OXYCODONE HCL 5 MG PO TABS
ORAL_TABLET | ORAL | Status: AC
  Filled 2019-05-02: qty 1

## 2019-05-02 MED ORDER — METHOCARBAMOL 500 MG PO TABS: 500.0000 mg | ORAL_TABLET | Freq: Four times a day (QID) | ORAL | 0 refills | Status: DC | PRN

## 2019-05-02 MED ORDER — TRAMADOL HCL 50 MG PO TABS: 50.0000 mg | ORAL_TABLET | Freq: Four times a day (QID) | ORAL | 0 refills | Status: DC | PRN

## 2019-05-02 MED ORDER — ACETAMINOPHEN 10 MG/ML IV SOLN
1000.0000 mg | Freq: Four times a day (QID) | INTRAVENOUS | Status: DC
  Administered 2019-05-02: 1000 mg via INTRAVENOUS
  Filled 2019-05-02: qty 100

## 2019-05-02 MED ORDER — TRANEXAMIC ACID 1000 MG/10ML IV SOLN
2000.0000 mg | Freq: Once | INTRAVENOUS | Status: DC
  Filled 2019-05-02: qty 20

## 2019-05-02 MED ORDER — FENTANYL CITRATE (PF) 100 MCG/2ML IJ SOLN
INTRAMUSCULAR | Status: AC
  Filled 2019-05-02: qty 2

## 2019-05-02 MED ORDER — LIDOCAINE 2% (20 MG/ML) 5 ML SYRINGE
INTRAMUSCULAR | Status: AC
  Filled 2019-05-02: qty 15

## 2019-05-02 MED ORDER — CHLORHEXIDINE GLUCONATE 4 % EX LIQD: 60.0000 mL | Freq: Once | CUTANEOUS | Status: DC

## 2019-05-02 MED ORDER — METHOCARBAMOL 500 MG IVPB - SIMPLE MED: 500.0000 mg | Freq: Four times a day (QID) | INTRAVENOUS | Status: DC | PRN

## 2019-05-02 MED ORDER — PHENYLEPHRINE HCL-NACL 10-0.9 MG/250ML-% IV SOLN
INTRAVENOUS | Status: DC | PRN
  Administered 2019-05-02: 25 ug/min via INTRAVENOUS

## 2019-05-02 MED ORDER — CEFAZOLIN SODIUM-DEXTROSE 2-4 GM/100ML-% IV SOLN: 2.0000 g | Freq: Four times a day (QID) | INTRAVENOUS | Status: DC

## 2019-05-02 MED ORDER — DEXAMETHASONE SODIUM PHOSPHATE 10 MG/ML IJ SOLN: 8.0000 mg | Freq: Once | INTRAMUSCULAR | Status: DC

## 2019-05-02 MED ORDER — 0.9 % SODIUM CHLORIDE (POUR BTL) OPTIME
TOPICAL | Status: DC | PRN
  Administered 2019-05-02: 1000 mL

## 2019-05-02 MED ORDER — ONDANSETRON HCL 4 MG/2ML IJ SOLN
INTRAMUSCULAR | Status: DC | PRN
  Administered 2019-05-02: 4 mg via INTRAVENOUS

## 2019-05-02 MED ORDER — EPHEDRINE SULFATE-NACL 50-0.9 MG/10ML-% IV SOSY
PREFILLED_SYRINGE | INTRAVENOUS | Status: DC | PRN
  Administered 2019-05-02: 10 mg via INTRAVENOUS

## 2019-05-02 MED ORDER — DEXAMETHASONE SODIUM PHOSPHATE 10 MG/ML IJ SOLN
INTRAMUSCULAR | Status: AC
  Filled 2019-05-02: qty 3

## 2019-05-02 MED ORDER — LACTATED RINGERS IV SOLN: INTRAVENOUS | Status: DC

## 2019-05-02 MED ORDER — OXYCODONE HCL 5 MG PO TABS
5.0000 mg | ORAL_TABLET | Freq: Once | ORAL | Status: AC | PRN
  Administered 2019-05-02: 12:00:00 5 mg via ORAL

## 2019-05-02 MED ORDER — PROPOFOL 10 MG/ML IV BOLUS
INTRAVENOUS | Status: AC
  Filled 2019-05-02: qty 40

## 2019-05-02 MED ORDER — OXYCODONE HCL 5 MG PO TABS: 5.0000 mg | ORAL_TABLET | Freq: Four times a day (QID) | ORAL | 0 refills | Status: DC | PRN

## 2019-05-02 MED ORDER — SODIUM CHLORIDE 0.9 % IR SOLN
Status: DC | PRN
  Administered 2019-05-02: 1000 mL

## 2019-05-02 MED ORDER — LACTATED RINGERS IV BOLUS
500.0000 mL | Freq: Once | INTRAVENOUS | Status: AC
  Administered 2019-05-02: 500 mL via INTRAVENOUS

## 2019-05-02 MED ORDER — ONDANSETRON HCL 4 MG/2ML IJ SOLN
INTRAMUSCULAR | Status: AC
  Filled 2019-05-02: qty 6

## 2019-05-02 MED ORDER — METHOCARBAMOL 500 MG PO TABS: 500.0000 mg | ORAL_TABLET | Freq: Four times a day (QID) | ORAL | Status: DC | PRN

## 2019-05-02 MED ORDER — FENTANYL CITRATE (PF) 100 MCG/2ML IJ SOLN
INTRAMUSCULAR | Status: DC | PRN
  Administered 2019-05-02: 25 ug via INTRAVENOUS

## 2019-05-02 MED ORDER — ROPIVACAINE HCL 5 MG/ML IJ SOLN
INTRAMUSCULAR | Status: DC | PRN
  Administered 2019-05-02: 30 mL via PERINEURAL

## 2019-05-02 MED ORDER — POVIDONE-IODINE 10 % EX SWAB
2.0000 "application " | Freq: Once | CUTANEOUS | Status: AC
  Administered 2019-05-02: 2 via TOPICAL

## 2019-05-02 MED ORDER — GABAPENTIN 300 MG PO CAPS: ORAL_CAPSULE | ORAL | 0 refills | Status: DC

## 2019-05-02 MED ORDER — PROPOFOL 500 MG/50ML IV EMUL
INTRAVENOUS | Status: DC | PRN
  Administered 2019-05-02: 75 ug/kg/min via INTRAVENOUS

## 2019-05-02 SURGICAL SUPPLY — 67 items
ATTUNE MED DOME PAT 38 KNEE (Knees) ×1 IMPLANT
ATTUNE MED DOME PAT 38MM KNEE (Knees) ×1 IMPLANT
ATTUNE PSFEM RTSZ6 NARCEM KNEE (Femur) ×3 IMPLANT
ATTUNE PSRP INSR SZ6 10 KNEE (Insert) ×1 IMPLANT
ATTUNE PSRP INSR SZ6 10MM KNEE (Insert) ×1 IMPLANT
BAG SPEC THK2 15X12 ZIP CLS (MISCELLANEOUS) ×1
BAG ZIPLOCK 12X15 (MISCELLANEOUS) ×3 IMPLANT
BASE TIBIAL ROT PLAT SZ 5 KNEE (Knees) ×1 IMPLANT
BLADE SAG 18X100X1.27 (BLADE) ×3 IMPLANT
BLADE SAW SGTL 11.0X1.19X90.0M (BLADE) ×3 IMPLANT
BLADE SURG SZ10 CARB STEEL (BLADE) ×6 IMPLANT
BNDG ELASTIC 6X5.8 VLCR STR LF (GAUZE/BANDAGES/DRESSINGS) ×3 IMPLANT
BOWL SMART MIX CTS (DISPOSABLE) ×3 IMPLANT
BSPLAT TIB 5 CMNT ROT PLAT STR (Knees) ×1 IMPLANT
CEMENT HV SMART SET (Cement) ×6 IMPLANT
CLOSURE STERI-STRIP 1/2X4 (GAUZE/BANDAGES/DRESSINGS) ×2
CLOSURE WOUND 1/2 X4 (GAUZE/BANDAGES/DRESSINGS) ×2
CLSR STERI-STRIP ANTIMIC 1/2X4 (GAUZE/BANDAGES/DRESSINGS) ×2 IMPLANT
COVER SURGICAL LIGHT HANDLE (MISCELLANEOUS) ×3 IMPLANT
COVER WAND RF STERILE (DRAPES) ×2 IMPLANT
CUFF TOURN SGL QUICK 34 (TOURNIQUET CUFF) ×3
CUFF TRNQT CYL 34X4.125X (TOURNIQUET CUFF) ×1 IMPLANT
DECANTER SPIKE VIAL GLASS SM (MISCELLANEOUS) ×3 IMPLANT
DRAPE TOP 10253 STERILE (DRAPES) ×2 IMPLANT
DRAPE U-SHAPE 47X51 STRL (DRAPES) ×3 IMPLANT
DRESSING AQUACEL AG SP 3.5X10 (GAUZE/BANDAGES/DRESSINGS) IMPLANT
DRSG AQUACEL AG ADV 3.5X10 (GAUZE/BANDAGES/DRESSINGS) ×3 IMPLANT
DRSG AQUACEL AG SP 3.5X10 (GAUZE/BANDAGES/DRESSINGS) ×3
DURAPREP 26ML APPLICATOR (WOUND CARE) ×3 IMPLANT
ELECT REM PT RETURN 15FT ADLT (MISCELLANEOUS) ×3 IMPLANT
EVACUATOR 1/8 PVC DRAIN (DRAIN) IMPLANT
FACESHIELD WRAPAROUND (MASK) ×3 IMPLANT
FACESHIELD WRAPAROUND OR TEAM (MASK) IMPLANT
GAUZE SPONGE 2X2 8PLY STRL LF (GAUZE/BANDAGES/DRESSINGS) ×1 IMPLANT
GLOVE BIO SURGEON STRL SZ7 (GLOVE) ×3 IMPLANT
GLOVE BIO SURGEON STRL SZ8 (GLOVE) ×3 IMPLANT
GLOVE BIOGEL PI IND STRL 7.0 (GLOVE) ×1 IMPLANT
GLOVE BIOGEL PI IND STRL 8 (GLOVE) ×1 IMPLANT
GLOVE BIOGEL PI INDICATOR 7.0 (GLOVE) ×2
GLOVE BIOGEL PI INDICATOR 8 (GLOVE) ×2
GOWN STRL REUS W/TWL LRG LVL3 (GOWN DISPOSABLE) ×6 IMPLANT
HANDPIECE INTERPULSE COAX TIP (DISPOSABLE) ×3
HOLDER FOLEY CATH W/STRAP (MISCELLANEOUS) IMPLANT
IMMOBILIZER KNEE 20 (SOFTGOODS) ×3
IMMOBILIZER KNEE 20 THIGH 36 (SOFTGOODS) ×1 IMPLANT
KIT TURNOVER KIT A (KITS) IMPLANT
MANIFOLD NEPTUNE II (INSTRUMENTS) ×3 IMPLANT
NS IRRIG 1000ML POUR BTL (IV SOLUTION) ×3 IMPLANT
PACK TOTAL KNEE CUSTOM (KITS) ×3 IMPLANT
PADDING CAST COTTON 6X4 STRL (CAST SUPPLIES) ×4 IMPLANT
PENCIL SMOKE EVACUATOR (MISCELLANEOUS) ×2 IMPLANT
PIN DRILL FIX HALF THREAD (BIT) ×2 IMPLANT
PIN STEINMAN FIXATION KNEE (PIN) ×2 IMPLANT
PROTECTOR NERVE ULNAR (MISCELLANEOUS) ×3 IMPLANT
SET HNDPC FAN SPRY TIP SCT (DISPOSABLE) ×1 IMPLANT
SPONGE GAUZE 2X2 STER 10/PKG (GAUZE/BANDAGES/DRESSINGS)
STRIP CLOSURE SKIN 1/2X4 (GAUZE/BANDAGES/DRESSINGS) ×4 IMPLANT
SUT MNCRL AB 4-0 PS2 18 (SUTURE) ×3 IMPLANT
SUT STRATAFIX 0 PDS 27 VIOLET (SUTURE) ×3
SUT VIC AB 2-0 CT1 27 (SUTURE) ×9
SUT VIC AB 2-0 CT1 TAPERPNT 27 (SUTURE) ×3 IMPLANT
SUTURE STRATFX 0 PDS 27 VIOLET (SUTURE) ×1 IMPLANT
TIBIAL BASE ROT PLAT SZ 5 KNEE (Knees) ×3 IMPLANT
TRAY FOLEY MTR SLVR 16FR STAT (SET/KITS/TRAYS/PACK) ×1 IMPLANT
WATER STERILE IRR 1000ML POUR (IV SOLUTION) ×6 IMPLANT
WRAP KNEE MAXI GEL POST OP (GAUZE/BANDAGES/DRESSINGS) ×3 IMPLANT
YANKAUER SUCT BULB TIP 10FT TU (MISCELLANEOUS) ×3 IMPLANT

## 2019-05-02 NOTE — Interval H&P Note (Signed)
History and Physical Interval Note:  05/02/2019 6:55 AM  Megan Parrish  has presented today for surgery, with the diagnosis of right knee osteoarthritis.  The various methods of treatment have been discussed with the patient and family. After consideration of risks, benefits and other options for treatment, the patient has consented to  Procedure(s) with comments: TOTAL KNEE ARTHROPLASTY SDDC (Right) - 25min as a surgical intervention.  The patient's history has been reviewed, patient examined, no change in status, stable for surgery.  I have reviewed the patient's chart and labs.  Questions were answered to the patient's satisfaction.     Pilar Plate [name]

## 2019-05-02 NOTE — Discharge Instructions (Signed)
Megan Arabian, MD Total Joint Specialist [facility] [place] [address], [address] [place], [place] [address] [phone]    TOTAL KNEE REPLACEMENT POSTOPERATIVE DIRECTIONS    Knee Rehabilitation, Guidelines Following Surgery  Results after knee surgery are often greatly improved when you follow the exercise, range of motion and muscle strengthening exercises prescribed by your doctor. Safety measures are also important to protect the knee from further injury. If any of these exercises cause you to have increased pain or swelling in your knee joint, decrease the amount until you are comfortable again and slowly increase them. If you have problems or questions, call your caregiver or physical therapist for advice.    **COUMADIN INSTRUCTIONS**  Instructions for coumadin and Lovenox pre and post knee replacement on 4/5.  3/31 - Last dose of coumadin until after surgery 4/1 - Nothing (No coumadin and No Lovenox) 4/2 - Lovenox in the AM 4/3 - Lovenox in the AM 4/4 - Lovenox in the AM (Please take by 7 am) 4/5 - Procedure (DO NOT TAKE LOVENOX TODAY) 4/6 - Lovenox in the AM and 3 tablets of coumadin 4/7 - Lovenox in the AM and 3 tablets of coumadin 4/8 - Lovenox in the AM and 3 tablets of coumadin 4/9 - Lovenox in the AM and 2 tablets of coumadin 4/10 - Stop Lovenox and resume coumadin 1 tablet daily except 2 tablets on Mon Wed and Fri.  Check INR on 4/20.  HOME CARE INSTRUCTIONS  . Remove items at home which could result in a fall. This includes throw rugs or furniture in walking pathways.   ICE to the affected knee as much as tolerated. Icing helps control swelling. If the swelling is well controlled you will be more comfortable and rehab easier. Continue to use ice on the knee for pain and swelling from surgery. You may notice swelling that will progress down to the foot and ankle. This is normal after surgery. Elevate the leg when you are not up walking on it.      Continue to use the breathing machine which will help keep your temperature down.  It is common for your temperature to cycle up and down following surgery, especially at night when you are not up moving around and exerting yourself.  The breathing machine keeps your lungs expanded and your temperature down.  Do not place pillow under knee, focus on keeping the knee straight while resting  PAIN CONTROL Achieving adequate pain control can be challenging in the first 48-72 hours after the nerve block wears off. During this time it is best to stay ahead of the pain by taking your prescribed pain meds every 4-6 hours. Once the pain is well controlled (you will not be pain free but the goal is having it under control) you may begin slowly weaning off the medications  DIET You may resume your previous home diet once you are discharged from the hospital.  DRESSING / [place] / SHOWERING . Keep your bulky bandage on for 2 days. On the third post-operative day you may remove the Ace bandage and gauze. There is a waterproof adhesive bandage on your skin which will stay in place until your first follow-up appointment. Once you remove this you will not need to place another bandage . You may begin showering 3 days following surgery, but do not submerge the incision under water.  ACTIVITY For the first 5 days the key is rest and control of pain and swelling . You  should rest, ice and elevate the leg for 50 minutes out of every hour. Get up and walk/stretch for 10 minutes per hour. After 5 days you can increase your activity slowly as tolerated . Walk with your walker as instructed. Use the walker until you are comfortable transitioning to a cane. Walk with the cane in the opposite hand of the operative leg. You may discontinue the cane once you are comfortable. . You may discontinue the knee immobilizer once you are able to perform a straight leg raise while lying down . Avoid periods of inactivity such  as sitting longer than an hour when not asleep. This helps prevent blood clots.  . Do your home exercises twice a day starting on post-operative day 3. On the days you go to physical therapy, just do the home exercises once that day. . Do not drive a car until released by your surgeon.  . Do not drive while taking narcotics.  TED HOSE STOCKINGS Wear the elastic stockings on both legs for three weeks following surgery during the day. You may remove them at night for sleeping.  WEIGHT BEARING You may bear weight as tolerated on the operative leg.  POSTOPERATIVE CONSTIPATION PROTOCOL Constipation - defined medically as fewer than three stools per week and severe constipation as less than one stool per week.  One of the most common issues patients have following surgery is constipation.  Even if you have a regular bowel pattern at home, your normal regimen is likely to be disrupted due to multiple reasons following surgery.  Combination of anesthesia, postoperative narcotics, change in appetite and fluid intake all can affect your bowels.  In order to avoid complications following surgery, here are some recommendations in order to help you during your recovery period.  Colace (docusate) - Pick up an over-the-counter form of Colace or another stool softener and take twice a day as long as you are requiring postoperative pain medications.  Take with a full glass of water daily.  If you experience loose stools or diarrhea, hold the colace until you stool forms back up.  If your symptoms do not get better within 1 week or if they get worse, check with your doctor.  MiraLax (polyethylene glycol) - Pick up over-the-counter to have on hand.  MiraLax is a solution that will increase the amount of water in your bowels to assist with bowel movements.  Take as directed and can mix with a glass of water, juice, soda, coffee, or tea.  Take if you go more than two days without a movement. Do not use MiraLax more  than once per day. Call your doctor if you are still constipated or irregular after using this medication for 7 days in a row.  If you continue to have problems with postoperative constipation, please contact the office for further assistance and recommendations.  If you experience "the worst abdominal pain ever" or develop nausea or vomiting, please contact the office immediatly for further recommendations for treatment.  ITCHING  If you experience itching with your medications, try taking only a single pain pill, or even half a pain pill at a time.  You can also use Benadryl over the counter for itching or also to help with sleep.   MEDICATIONS See your medication summary on the "After Visit Summary" that the nursing staff will review with you prior to discharge.  You may have some home medications which will be placed on hold until you complete the course of blood thinner  medication.  It is important for you to complete the blood thinner medication as prescribed by your surgeon.  Continue your approved medications as instructed at time of discharge.  PRECAUTIONS If you experience chest pain or shortness of breath - call 911 immediately for transfer to the hospital emergency department.  If you develop a fever greater that 101 F, purulent drainage from wound, increased redness or drainage from wound, foul odor from the wound/dressing, or calf pain - CONTACT YOUR SURGEON.                                                   FOLLOW-UP APPOINTMENTS Make sure you keep all of your appointments after your operation with your surgeon and caregivers. You should call the office at the above phone number and make an appointment for approximately two weeks after the date of your surgery or on the date instructed by your surgeon outlined in the "After Visit Summary".  MAKE SURE YOU:  . Understand these instructions.  . Get help right away if you are not doing well or get worse.   DENTAL ANTIBIOTICS:  In  most cases prophylactic antibiotics for Dental procdeures after total joint surgery are not necessary.  Exceptions are as follows:  1. History of prior total joint infection  2. Severely immunocompromised (Organ Transplant, cancer chemotherapy, Rheumatoid biologic meds such as [place])  3. Poorly controlled diabetes (A1C &gt; 8.0, blood glucose over 200)  If you have one of these conditions, contact your surgeon for an antibiotic prescription, prior to your dental procedure.    Pick up stool softner and laxative for home use following surgery while on pain medications. May shower starting three days after surgery. Please use a clean towel to pat the incision dry following showers. Continue to use ice for pain and swelling after surgery. Do not use any lotions or creams on the incision until instructed by your surgeon.

## 2019-05-02 NOTE — Transfer of Care (Signed)
Immediate Anesthesia Transfer of Care Note  Patient: Megan Parrish  Procedure(s) Performed: Procedure(s) with comments: TOTAL KNEE ARTHROPLASTY SDDC (Right) - 60min  Patient Location: PACU  Anesthesia Type:Spinal  Level of Consciousness:  sedated, patient cooperative and responds to stimulation  Airway & Oxygen Therapy:Patient Spontanous Breathing and Patient connected to face mask oxgen  Post-op Assessment:  Report given to PACU RN and Post -op Vital signs reviewed and stable  Post vital signs:  Reviewed and stable  Last Vitals:  Vitals:   05/02/19 0800 05/02/19 0801  BP:    Pulse: 71 70  Resp: 16 15  Temp:    SpO2: [id number]    Complications: No apparent anesthesia complications

## 2019-05-02 NOTE — Progress Notes (Signed)
Assisted Dr. Hodierne with right, ultrasound guided, adductor canal block. Side rails up, monitors on throughout procedure. See vital signs in flow sheet. Tolerated Procedure well.  

## 2019-05-02 NOTE — Anesthesia Preprocedure Evaluation (Signed)
Anesthesia Evaluation  Patient identified by MRN, date of birth, ID band Patient awake    Reviewed: Allergy & Precautions, H&P , NPO status , Patient's Chart, lab work & pertinent test results  Airway Mallampati: II   Neck ROM: full    Dental   Pulmonary asthma , former smoker,    breath sounds clear to auscultation       Cardiovascular + DVT   Rhythm:regular Rate:Normal  On Warfarin   Neuro/Psych PSYCHIATRIC DISORDERS Anxiety    GI/Hepatic GERD  ,  Endo/Other  obese  Renal/GU      Musculoskeletal  (+) Arthritis ,   Abdominal   Peds  Hematology Factor V deficiency   Anesthesia Other Findings   Reproductive/Obstetrics                             Anesthesia Physical Anesthesia Plan  ASA: III  Anesthesia Plan: General   Post-op Pain Management:  Regional for Post-op pain   Induction: Intravenous  PONV Risk Score and Plan: 3 and Ondansetron, Dexamethasone, Midazolam and Treatment may vary due to age or medical condition  Airway Management Planned: LMA  Additional Equipment:   Intra-op Plan:   Post-operative Plan:   Informed Consent: I have reviewed the patients History and Physical, chart, labs and discussed the procedure including the risks, benefits and alternatives for the proposed anesthesia with the patient or authorized representative who has indicated his/her understanding and acceptance.       Plan Discussed with: CRNA, Anesthesiologist and Surgeon  Anesthesia Plan Comments:         Anesthesia Quick Evaluation

## 2019-05-02 NOTE — Op Note (Signed)
OPERATIVE REPORT-TOTAL KNEE ARTHROPLASTY   Pre-operative diagnosis- Osteoarthritis  Right knee(s)  Post-operative diagnosis- Osteoarthritis Right knee(s)  Procedure-  Right  Total Knee Arthroplasty  Surgeon- Megan Plover. Francelia Mclaren, MD  Assistant- Molli Barrows, PA-C   Anesthesia-  Adductor canal block and spinal  EBL- 25 ml   Drains Hemovac  Tourniquet time- 42 minutes @ [id number] mm Hg    Complications- None  Condition-PACU - hemodynamically stable.   Brief Clinical Note  Megan Parrish is a 64 y.o. year old female with end stage OA of her right knee with progressively worsening pain and dysfunction. She has constant pain, with activity and at rest and significant functional deficits with difficulties even with ADLs. She has had extensive non-op management including analgesics, injections of cortisone and viscosupplements, and home exercise program, but remains in significant pain with significant dysfunction.Radiographs show bone on bone arthritis medial and patellofemoral. She presents now for right Total Knee Arthroplasty.    Procedure in detail---   The patient is brought into the operating room and positioned supine on the operating table. After successful administration of  Adductor canal block and spinal,   a tourniquet is placed high on the  Right thigh(s) and the lower extremity is prepped and draped in the usual sterile fashion. Time out is performed by the operating team and then the  Right lower extremity is wrapped in Esmarch, knee flexed and the tourniquet inflated to 300 mmHg.       A midline incision is made with a ten blade through the subcutaneous tissue to the level of the extensor mechanism. A fresh blade is used to make a medial parapatellar arthrotomy. Soft tissue over the proximal medial tibia is subperiosteally elevated to the joint line with a knife and into the semimembranosus bursa with a Cobb elevator. Soft tissue over the proximal lateral tibia is elevated with  attention being paid to avoiding the patellar tendon on the tibial tubercle. The patella is everted, knee flexed 90 degrees and the ACL and PCL are removed. Findings are bone on bone medial and patellofemoral with large global osteophytes.        The drill is used to create a starting hole in the distal femur and the canal is thoroughly irrigated with sterile saline to remove the fatty contents. The 5 degree Right  valgus alignment guide is placed into the femoral canal and the distal femoral cutting block is pinned to remove 9 mm off the distal femur. Resection is made with an oscillating saw.      The tibia is subluxed forward and the menisci are removed. The extramedullary alignment guide is placed referencing proximally at the medial aspect of the tibial tubercle and distally along the second metatarsal axis and tibial crest. The block is pinned to remove 74mm off the more deficient medial  side. Resection is made with an oscillating saw. Size 5is the most appropriate size for the tibia and the proximal tibia is prepared with the modular drill and keel punch for that size.      The femoral sizing guide is placed and size 6 is most appropriate. Rotation is marked off the epicondylar axis and confirmed by creating a rectangular flexion gap at 90 degrees. The size 6 cutting block is pinned in this rotation and the anterior, posterior and chamfer cuts are made with the oscillating saw. The intercondylar block is then placed and that cut is made.      Trial size 5 tibial component, trial size  6 narrow posterior stabilized femur and a 10  mm posterior stabilized rotating platform insert trial is placed. Full extension is achieved with excellent varus/valgus and anterior/posterior balance throughout full range of motion. The patella is everted and thickness measured to be 24  mm. Free hand resection is taken to 14 mm, a 38 template is placed, lug holes are drilled, trial patella is placed, and it tracks normally.  Osteophytes are removed off the posterior femur with the trial in place. All trials are removed and the cut bone surfaces prepared with pulsatile lavage. Cement is mixed and once ready for implantation, the size 5 tibial implant, size  6 narrow posterior stabilized femoral component, and the size 38 patella are cemented in place and the patella is held with the clamp. The trial insert is placed and the knee held in full extension. The Exparel (20 ml mixed with 60 ml saline) is injected into the extensor mechanism, posterior capsule, medial and lateral gutters and subcutaneous tissues.  All extruded cement is removed and once the cement is hard the permanent 10 mm posterior stabilized rotating platform insert is placed into the tibial tray.      The wound is copiously irrigated with saline solution and the extensor mechanism closed over a hemovac drain with #1 V-loc suture. The tourniquet is released for a total tourniquet time of 42  minutes. Flexion against gravity is 140 degrees and the patella tracks normally. Subcutaneous tissue is closed with 2.0 vicryl and subcuticular with running 4.0 Monocryl. The incision is cleaned and dried and steri-strips and a bulky sterile dressing are applied. The limb is placed into a knee immobilizer and the patient is awakened and transported to recovery in stable condition.      Please note that a surgical assistant was a medical necessity for this procedure in order to perform it in a safe and expeditious manner. Surgical assistant was necessary to retract the ligaments and vital neurovascular structures to prevent injury to them and also necessary for proper positioning of the limb to allow for anatomic placement of the prosthesis.   Megan Plover Carman Auxier, MD    05/02/2019, 9:18 AM

## 2019-05-02 NOTE — Anesthesia Procedure Notes (Signed)
Spinal  Patient location during procedure: OR Start time: 05/02/2019 8:10 AM End time: 05/02/2019 8:13 AM Staffing Performed: anesthesiologist  Anesthesiologist: Albertha Ghee, MD Preanesthetic Checklist Completed: patient identified, IV checked, risks and benefits discussed, surgical consent, monitors and equipment checked, pre-op evaluation and timeout performed Spinal Block Patient position: sitting Prep: DuraPrep Patient monitoring: cardiac monitor, continuous pulse ox and blood pressure Approach: midline Location: L3-4 Injection technique: single-shot Needle Needle type: Pencan  Needle gauge: 24 G Needle length: 9 cm Assessment Sensory level: T10 Additional Notes Functioning IV was confirmed and monitors were applied. Sterile prep and drape, including hand hygiene and sterile gloves were used. The patient was positioned and the spine was prepped. The skin was anesthetized with lidocaine.  Free flow of clear CSF was obtained prior to injecting local anesthetic into the CSF.  The spinal needle aspirated freely following injection.  The needle was carefully withdrawn.  The patient tolerated the procedure well.

## 2019-05-02 NOTE — Progress Notes (Signed)
Physical Therapy Treatment Patient Details Name: Megan Parrish MRN: [id number] DOB: 05-Jun-1955 Today's Date: 05/02/2019    History of Present Illness Patient is 64 y.o. female s/p Rt TKA on 05/02/19 with PMH significant for GERD, DVT, asthma, OA, anxiety, plantar fasciitis, Lt TKA on 01/31/19.    PT Comments    Patient seen for additional acute therapy session on POD 0 in preparation and effort to meet SDDC goal. Patient continues to have significant extensor lag with Rt quad and quad strength of 1/5 for MMT. Patient was educated on precautions/requirements for discontinuation of knee immobilizer with mobility and instructed to wear it at all times when mobilizing until meeting this criteria. Patient demonstrated good carryover for transfers and gait with RW requiring supervision. Patient educated on safe sequencing for stair mobility and verbalized safe guarding position for people assisting with mobility. No significant buckling noted on Rt LE with stairs this session, pt demonstrating improved ability to reduce Rt LE weight bearing. Patient instructed in exercises to facilitate ROM with review using Lt LE and circulation. Patient will benefit from continued skilled PT interventions to address impairments and progress towards PLOF. Patient has met mobility goals at adequate level for discharge home; will continue to follow if pt continues acute stay to progress towards Mod I goals.      Follow Up Recommendations  Follow surgeon's recommendation for DC plan and follow-up therapies;Outpatient PT(pt reports getting OPPT)     Equipment Recommendations  None recommended by PT(pt has all DME)    Recommendations for Other Services       Precautions / Restrictions Precautions Precautions: Fall Restrictions Weight Bearing Restrictions: No    Mobility  Bed Mobility Overal bed mobility: Needs Assistance Bed Mobility: Supine to Sit;Sit to Supine     Supine to sit: Supervision;HOB elevated Sit  to supine: Supervision;HOB elevated   General bed mobility comments: no assist required, pt able to mobilize Rt LE in immobilizer.   Transfers Overall transfer level: Needs assistance Equipment used: Rolling walker (2 wheeled) Transfers: Sit to/from Stand Sit to Stand: From elevated surface;Supervision      General transfer comment: pt with good carryover for safe hand placement for power up. pt steady with rising.  Ambulation/Gait Ambulation/Gait assistance: Min assist;Min guard Gait Distance (Feet): 100 Feet Assistive device: Rolling walker (2 wheeled)(Rt knee immobilizer) Gait Pattern/deviations: Step-to pattern;Decreased stride length;Decreased weight shift to right Gait velocity: decreased   General Gait Details: pt maintianed safe proximity to RW, no overt LOB. knee immobilizer required to prevent buckling on Rt knee. cues for step to pattern needed intermittently.   Stairs Stairs: Yes Stairs assistance: Min guard(p thas 2 person assist to enter home) Stair Management: No rails;Step to pattern;Backwards;With walker Number of Stairs: 4(2x2) General stair comments: reviewed safe step pattern with patient. "up with good, down with bad". Pt able to use UE's on RW for support to complete reverse step up with Lt LE. Pt instructed RN and therapist on assistin/guarding on second bout. no significant buckling noted on Rt LE.   Wheelchair Mobility    Modified Rankin (Stroke Patients Only)       Balance Overall balance assessment: Needs assistance Sitting-balance support: Feet supported Sitting balance-Leahy Scale: Good     Standing balance support: Bilateral upper extremity supported;During functional activity Standing balance-Leahy Scale: Poor Standing balance comment: reliant on external support           Cognition Arousal/Alertness: Awake/alert Behavior During Therapy: WFL for tasks assessed/performed Overall Cognitive Status: Within  Functional Limits for tasks  assessed       Exercises Total Joint Exercises Ankle Circles/Pumps: Both;15 reps;Supine Quad Sets: AROM;Left;Other reps (comment);Supine(2) Towel Squeeze: AROM;Both;5 reps;Supine Short Arc Quad: AROM;Left;Other reps (comment);Supine(2) Heel Slides: AROM;Left;Other reps (comment);Supine(2) Hip ABduction/ADduction: AROM;Right;5 reps;Supine Straight Leg Raises: AROM;Left;Other reps (comment);Supine(2) Long Arc Quad: (demonstration) Knee Flexion: AAROM;AROM(demonstration)    General Comments        Pertinent Vitals/Pain Pain Assessment: 0-10 Pain Score: 3  Pain Location: Rt knee Pain Descriptors / Indicators: Aching Pain Intervention(s): Limited activity within patient's tolerance;Monitored during session;Repositioned    Home Living Family/patient expects to be discharged to:: Private residence Living Arrangements: Other relatives(sister and sister-in-law) Available Help at Discharge: Family;Available 24 hours/day Type of Home: House Home Access: Stairs to enter Entrance Stairs-Rails: None Home Layout: One level Home Equipment: Environmental consultant - 4 wheels;Cane - single point;Bedside commode;Hand held shower head(BSC over toilet and in shower for seat)      Prior Function Level of Independence: Independent      Comments: works full time for [employer] and has been independent with mobility. pt enjoys walking her dog.   PT Goals (current goals can now be found in the care plan section) Acute Rehab PT Goals Patient Stated Goal: to go home today PT Goal Formulation: With patient Time For Goal Achievement: 05/09/19 Potential to Achieve Goals: Good Progress towards PT goals: Progressing toward goals    Frequency    7X/week      PT Plan Current plan remains appropriate       AM-PAC PT "6 Clicks" Mobility   Outcome Measure  Help needed turning from your back to your side while in a flat bed without using bedrails?: None Help needed moving from lying on your back to  sitting on the side of a flat bed without using bedrails?: A Little Help needed moving to and from a bed to a chair (including a wheelchair)?: A Little Help needed standing up from a chair using your arms (e.g., wheelchair or bedside chair)?: A Little Help needed to walk in hospital room?: A Little Help needed climbing 3-5 steps with a railing? : A Little 6 Click Score: 19    End of Session Equipment Utilized During Treatment: Gait belt;Right knee immobilizer Activity Tolerance: Patient tolerated treatment well Patient left: with call bell/phone within reach;in bed Nurse Communication: Mobility status PT Visit Diagnosis: Muscle weakness (generalized) (M62.81);Difficulty in walking, not elsewhere classified (R26.2)     Time: 3414-4360 PT Time Calculation (min) (ACUTE ONLY): 26 min  Charges:  $Gait Training: 8-22 mins $Therapeutic Exercise: 8-22 mins                     Verner Mould, DPT Physical Therapist with [facility] [phone]  05/02/2019 5:00 PM

## 2019-05-02 NOTE — Evaluation (Signed)
Physical Therapy Evaluation Patient Details Name: Megan Parrish MRN: [id number] DOB: [birth year] Today's Date: 05/02/2019   History of Present Illness  Patient is 64 y.o. female s/p Rt TKA on 05/02/19 with PMH significant for GERD, DVT, asthma, OA, anxiety, plantar fasciitis, Lt TKA on 01/31/19.    Clinical Impression  Megan Parrish is a 64 y.o. female POD 0 s/p Rt TKA. Patient reports independence with mobility at baseline. Patient is now limited by functional impairments (see PT problem list below) and requires min assist for transfers and gait with RW and use of knee immobilizer on Rt knee. Patient was able to ambulate ~170 feet with RW and min assist and cues for safe walker management and min assist 3x to prevent LOB due to Rt knee buckling. Patient educated on safe sequencing for stair mobility and performed 1 reverse step up. Pt was very untseady and required 2+ assist for safety and mod assist to prevent Rt LE buckling with Lt step up. Pt unable to decrease Rt LE weight bearing enough to step up second step with support of RW. Patient instructed in exercises to facilitate circulation. Patient is not safe at this time to discharge home and will require additional therapy to progress mobility to safe level for discharge. Patient will benefit from continued skilled PT interventions to address impairments and progress towards PLOF.     Follow Up Recommendations Follow surgeon's recommendation for DC plan and follow-up therapies;Outpatient PT(pt reports getting OPPT)    Equipment Recommendations  None recommended by PT(pt has all DME)    Recommendations for Other Services       Precautions / Restrictions Precautions Precautions: Fall Restrictions Weight Bearing Restrictions: No      Mobility  Bed Mobility Overal bed mobility: Needs Assistance Bed Mobility: Supine to Sit;Sit to Supine     Supine to sit: Supervision;HOB elevated Sit to supine: Supervision;HOB elevated   General bed  mobility comments: HOB slightly elevated, pt using bed rails and instructed to avoid use as she does not have this set up at home. Pt with immobilier on Rt LE and able to bring Rt LE off/on bed without assist.  Transfers Overall transfer level: Needs assistance Equipment used: Rolling walker (2 wheeled) Transfers: Sit to/from [facility] Sit to Stand: Min guard;From elevated surface Stand pivot transfers: Min guard       General transfer comment: pt performed sit<>stand from EOB and toilet with RW. Cues for safe hand placement and min guard for safety. Cues for use of grab bar in bathroom to control lowering and assist with power up.  Ambulation/Gait Ambulation/Gait assistance: Min assist;Min guard Gait Distance (Feet): 170 Feet Assistive device: Rolling walker (2 wheeled)(Rt knee immobilizer) Gait Pattern/deviations: Step-to pattern;Decreased stride length;Decreased weight shift to right Gait velocity: decreased   General Gait Details: verbal cues required for safe step pattern and proximity to RW. Cues needed to unweight Rt LE with use of UE's due to quad weakness and Rt knee buckling in stance. Pt required min assist 3x to prevent LOB due to buckling and min assist intermittently to position walker.   Stairs Stairs: Yes Stairs assistance: +2 safety/equipment;Mod assist Stair Management: No rails;Step to pattern;Backwards;With walker Number of Stairs: 1 General stair comments: pt instructed on safe step pattern and sequencing for stairs "up with good, down with bad". Pt familiar with reverse step up technique 2 person assist for safety. Pt with buckling on Rt Knee on first attempt to step up with Lt LE and  cues required to increase UE support on walker. Pt unable to effectively use UE's on RW reduce Rt LE weight bearing to step up to second step and very unsteady with buckling. pt unsafe with stairs at this time.  Wheelchair Mobility    Modified Rankin (Stroke  Patients Only)       Balance Overall balance assessment: Needs assistance Sitting-balance support: Feet supported Sitting balance-Leahy Scale: Good     Standing balance support: Bilateral upper extremity supported;During functional activity Standing balance-Leahy Scale: Poor Standing balance comment: reliant on external support              Pertinent Vitals/Pain Pain Assessment: 0-10 Pain Score: 2  Pain Location: Rt knee Pain Descriptors / Indicators: Aching Pain Intervention(s): Limited activity within patient's tolerance;Monitored during session;Repositioned    Home Living Family/patient expects to be discharged to:: Private residence Living Arrangements: Other relatives(sister and sister-in-law) Available Help at Discharge: Family;Available 24 hours/day Type of Home: House Home Access: Stairs to enter Entrance Stairs-Rails: None Entrance Stairs-Number of Steps: 2 steps in from garage Home Layout: One level Home Equipment: Environmental consultant - 4 wheels;Cane - single point;Bedside commode;Hand held shower head(BSC over toilet and in shower for seat)      Prior Function Level of Independence: Independent         Comments: works full time for [employer] and has been independent with mobility. pt enjoys walking her dog.     Hand Dominance   Dominant Hand: Right    Extremity/Trunk Assessment   Upper Extremity Assessment Upper Extremity Assessment: Overall WFL for tasks assessed    Lower Extremity Assessment Lower Extremity Assessment: RLE deficits/detail RLE Deficits / Details: pt with poor quad activation, unable to complete SLR, LAQ, SAQ. quad strength 1/5 with MMT. RLE: Unable to fully assess due to immobilization(donned due to weakness) RLE Sensation: WNL RLE Coordination: decreased gross motor    Cervical / Trunk Assessment Cervical / Trunk Assessment: Normal  Communication   Communication: No difficulties  Cognition Arousal/Alertness:  Awake/alert Behavior During Therapy: WFL for tasks assessed/performed Overall Cognitive Status: Within Functional Limits for tasks assessed           General Comments      Exercises Total Joint Exercises Ankle Circles/Pumps: Both;15 reps;Supine   Assessment/Plan    PT Assessment Patient needs continued PT services  PT Problem List Decreased strength;Decreased range of motion;Decreased activity tolerance;Decreased balance;Decreased mobility;Decreased knowledge of use of DME;Obesity;Decreased knowledge of precautions       PT Treatment Interventions DME instruction;Gait training;Stair training;Functional mobility training;Therapeutic activities;Therapeutic exercise;Balance training;Patient/family education    PT Goals (Current goals can be found in the Care Plan section)  Acute Rehab PT Goals Patient Stated Goal: to go home today PT Goal Formulation: With patient Time For Goal Achievement: 05/09/19 Potential to Achieve Goals: Good    Frequency 7X/week    AM-PAC PT "6 Clicks" Mobility  Outcome Measure Help needed turning from your back to your side while in a flat bed without using bedrails?: None Help needed moving from lying on your back to sitting on the side of a flat bed without using bedrails?: A Little Help needed moving to and from a bed to a chair (including a wheelchair)?: A Little Help needed standing up from a chair using your arms (e.g., wheelchair or bedside chair)?: A Little Help needed to walk in hospital room?: A Little Help needed climbing 3-5 steps with a railing? : Total 6 Click Score: 17    End of Session  Equipment Utilized During Treatment: Gait belt;Right knee immobilizer Activity Tolerance: Patient tolerated treatment well Patient left: with call bell/phone within reach;in bed Nurse Communication: Mobility status PT Visit Diagnosis: Muscle weakness (generalized) (M62.81);Difficulty in walking, not elsewhere classified (R26.2)    Time:  [id number] PT Time Calculation (min) (ACUTE ONLY): 35 min   Charges:   PT Evaluation $PT Eval Moderate Complexity: 1 Mod PT Treatments $Gait Training: 8-22 mins        Verner Mould, DPT Physical Therapist with [facility] [phone]  05/02/2019 1:54 PM

## 2019-05-02 NOTE — Anesthesia Procedure Notes (Signed)
Anesthesia Regional Block: Adductor canal block   Pre-Anesthetic Checklist: ,, timeout performed, Correct Patient, Correct Site, Correct Laterality, Correct Procedure, Correct Position, site marked, Risks and benefits discussed,  Surgical consent,  Pre-op evaluation,  At surgeon's request and post-op pain management  Laterality: Right  Prep: chloraprep       Needles:  Injection technique: Single-shot  Needle Type: Echogenic Needle     Needle Length: 9cm  Needle Gauge: 21     Additional Needles:   Narrative:  Start time: 05/02/2019 7:40 AM End time: 05/02/2019 7:50 AM Injection made incrementally with aspirations every 5 mL.  Performed by: Personally  Anesthesiologist: Albertha Ghee, MD  Additional Notes: Pt tolerated the procedure well.

## 2019-05-02 NOTE — Anesthesia Postprocedure Evaluation (Signed)
Anesthesia Post Note  Patient: Megan Parrish  Procedure(s) Performed: TOTAL KNEE ARTHROPLASTY SDDC (Right Knee)     Patient location during evaluation: PACU Anesthesia Type: Spinal and Regional Level of consciousness: oriented and awake and alert Pain management: pain level controlled Vital Signs Assessment: post-procedure vital signs reviewed and stable Respiratory status: spontaneous breathing, respiratory function stable and patient connected to nasal cannula oxygen Cardiovascular status: blood pressure returned to baseline and stable Postop Assessment: no headache, no backache and no apparent nausea or vomiting Anesthetic complications: no    Last Vitals:  Vitals:   05/02/19 1239 05/02/19 1332  BP:  111/70  Pulse: 76 86  Resp:    Temp:    SpO2: 97% 95%    Last Pain:  Vitals:   05/02/19 1332  TempSrc:   PainSc: 2                  Shaheen Mende S

## 2019-05-03 ENCOUNTER — Encounter: Payer: Self-pay | Admitting: *Deleted

## 2019-05-12 ENCOUNTER — Other Ambulatory Visit: Payer: Self-pay | Admitting: Internal Medicine

## 2019-05-13 NOTE — Telephone Encounter (Signed)
Pt is compliant with coumadin management. Sent in script 

## 2019-05-17 ENCOUNTER — Other Ambulatory Visit: Payer: Self-pay

## 2019-05-17 ENCOUNTER — Ambulatory Visit (INDEPENDENT_AMBULATORY_CARE_PROVIDER_SITE_OTHER): Payer: 59 | Admitting: General Practice

## 2019-05-17 DIAGNOSIS — Z7901 Long term (current) use of anticoagulants: Secondary | ICD-10-CM

## 2019-05-17 LAB — POCT INR: INR: 1.4 — AB (ref 2.0–3.0)

## 2019-05-17 NOTE — Progress Notes (Signed)
Medical treatment/procedure(s) were performed by non-physician practitioner and as supervising physician I was immediately available for consultation/collaboration. I agree with above. Yuleimy Kretz A Sherl Yzaguirre, MD  

## 2019-05-17 NOTE — Patient Instructions (Addendum)
Pre visit review using our clinic review tool, if applicable. No additional management support is needed unless otherwise documented below in the visit note.  Take 2 tablets today, 3 tablets tomorrow and 2 tablets on Thursday.  On Friday continue to take 2 tablets daily except 1 tablet on Sun, Tues and Thurs.  Re-check in 2 weeks.

## 2019-05-24 ENCOUNTER — Telehealth: Payer: Self-pay

## 2019-05-24 NOTE — Telephone Encounter (Signed)
She is on coumadin so as long as her INR is therapeutic (last was not) she would not be at risk for blood clots.

## 2019-05-24 NOTE — Telephone Encounter (Signed)
New message   The patient wants to get Dr. Nathanial Millman input on the [employer] vaccine with someone who has a history of blood clots.

## 2019-05-24 NOTE — Telephone Encounter (Signed)
Please advise 

## 2019-05-25 NOTE — Telephone Encounter (Signed)
Called and informed patient of message. Patient understood.

## 2019-05-30 ENCOUNTER — Other Ambulatory Visit: Payer: Self-pay

## 2019-05-30 MED ORDER — TRIAMCINOLONE ACETONIDE 0.1 % EX CREA: 1.0000 "application " | TOPICAL_CREAM | Freq: Two times a day (BID) | CUTANEOUS | 3 refills | Status: DC

## 2019-06-07 ENCOUNTER — Ambulatory Visit (INDEPENDENT_AMBULATORY_CARE_PROVIDER_SITE_OTHER): Payer: 59 | Admitting: General Practice

## 2019-06-07 ENCOUNTER — Other Ambulatory Visit: Payer: Self-pay

## 2019-06-07 DIAGNOSIS — Z7901 Long term (current) use of anticoagulants: Secondary | ICD-10-CM

## 2019-06-07 LAB — POCT INR: INR: 4.3 — AB (ref 2.0–3.0)

## 2019-06-07 NOTE — Patient Instructions (Addendum)
Pre visit review using our clinic review tool, if applicable. No additional management support is needed unless otherwise documented below in the visit note.  Hold coumadin tomorrow and Thursday.  On Friday change dosage to 2 tablets daily except 1 tablet on Sun, Tues and Thurs and Saturdays.  Re-check in 3 to 4 weeks.

## 2019-06-07 NOTE — Progress Notes (Signed)
Medical treatment/procedure(s) were performed by non-physician practitioner and as supervising physician I was immediately available for consultation/collaboration. I agree with above. Ocie Tino A Jacqualyn Sedgwick, MD  

## 2019-07-14 ENCOUNTER — Ambulatory Visit (INDEPENDENT_AMBULATORY_CARE_PROVIDER_SITE_OTHER): Payer: No Typology Code available for payment source | Admitting: Internal Medicine

## 2019-07-14 ENCOUNTER — Encounter: Payer: Self-pay | Admitting: Internal Medicine

## 2019-07-14 ENCOUNTER — Other Ambulatory Visit: Payer: Self-pay

## 2019-07-14 ENCOUNTER — Ambulatory Visit (INDEPENDENT_AMBULATORY_CARE_PROVIDER_SITE_OTHER): Payer: No Typology Code available for payment source | Admitting: General Practice

## 2019-07-14 VITALS — BP 126/82 | HR 86 | Temp 98.9°F | Ht 69.0 in | Wt 225.0 lb

## 2019-07-14 DIAGNOSIS — J4520 Mild intermittent asthma, uncomplicated: Secondary | ICD-10-CM

## 2019-07-14 DIAGNOSIS — I82409 Acute embolism and thrombosis of unspecified deep veins of unspecified lower extremity: Secondary | ICD-10-CM | POA: Diagnosis not present

## 2019-07-14 DIAGNOSIS — Z7901 Long term (current) use of anticoagulants: Secondary | ICD-10-CM | POA: Diagnosis not present

## 2019-07-14 DIAGNOSIS — E785 Hyperlipidemia, unspecified: Secondary | ICD-10-CM | POA: Diagnosis not present

## 2019-07-14 DIAGNOSIS — Z0000 Encounter for general adult medical examination without abnormal findings: Secondary | ICD-10-CM

## 2019-07-14 DIAGNOSIS — K219 Gastro-esophageal reflux disease without esophagitis: Secondary | ICD-10-CM

## 2019-07-14 LAB — LIPID PANEL
Cholesterol: 246 mg/dL — ABNORMAL HIGH (ref 0–200)
HDL: 62.6 mg/dL (ref >39.00)
LDL Cholesterol: 162 mg/dL — ABNORMAL HIGH (ref 0–99)
NonHDL: 182.9
Total CHOL/HDL Ratio: 4
Triglycerides: 104 mg/dL (ref 0.0–149.0)
VLDL: 20.8 mg/dL (ref 0.0–40.0)

## 2019-07-14 LAB — VITAMIN D 25 HYDROXY (VIT D DEFICIENCY, FRACTURES): VITD: 35.29 ng/mL (ref 30.00–100.00)

## 2019-07-14 LAB — POCT INR: INR: 2.2 (ref 2.0–3.0)

## 2019-07-14 LAB — HEMOGLOBIN A1C: Hgb A1c MFr Bld: 5.8 % (ref 4.6–6.5)

## 2019-07-14 MED ORDER — WARFARIN SODIUM 5 MG PO TABS: ORAL_TABLET | ORAL | 1 refills | Status: DC

## 2019-07-14 MED ORDER — ALBUTEROL SULFATE HFA 108 (90 BASE) MCG/ACT IN AERS: 2.0000 | INHALATION_SPRAY | RESPIRATORY_TRACT | 3 refills | Status: DC | PRN

## 2019-07-14 NOTE — Assessment & Plan Note (Signed)
Checking lipid panel and adjust as needed. Not on meds currently. Weight is down this year.

## 2019-07-14 NOTE — Assessment & Plan Note (Signed)
Stable with prn albuterol. Advised can take otc allergy medication.

## 2019-07-14 NOTE — Progress Notes (Signed)
Medical treatment/procedure(s) were performed by non-physician practitioner and as supervising physician I was immediately available for consultation/collaboration. I agree with above. Garey Alleva A Rissa Turley, MD  

## 2019-07-14 NOTE — Assessment & Plan Note (Signed)
Flu shot due next season. Covid-19 counseled and she will get assuming INR therapeutic today. Shingrix counseled wants to prioritize covid-19 first. Tetanus up to date. Cologuard up to date. Mammogram up to date, pap smear up to date and dexa up to date. Counseled about sun safety and mole surveillance. Counseled about the dangers of distracted driving. Given 10 year screening recommendations.

## 2019-07-14 NOTE — Assessment & Plan Note (Signed)
Controlled on nexium.  

## 2019-07-14 NOTE — Progress Notes (Signed)
   Subjective:   Patient ID: Megan Parrish, female    DOB: 1955/02/10, 64 y.o.   MRN: [id number]  HPI The patient is a 64 YO female coming in for physical. Both knee replacements done earlier this year.   PMH, [facility], social history reviewed and updated  Review of Systems  Constitutional: Negative.   HENT: Negative.   Eyes: Negative.   Respiratory: Negative for cough, chest tightness and shortness of breath.   Cardiovascular: Negative for chest pain, palpitations and leg swelling.  Gastrointestinal: Negative for abdominal distention, abdominal pain, constipation, diarrhea, nausea and vomiting.  Musculoskeletal: Negative.   Skin: Negative.   Neurological: Negative.   Psychiatric/Behavioral: Negative.     Objective:  Physical Exam Constitutional:      Appearance: She is well-developed.  HENT:     Head: Normocephalic and atraumatic.  Cardiovascular:     Rate and Rhythm: Normal rate and regular rhythm.  Pulmonary:     Effort: Pulmonary effort is normal. No respiratory distress.     Breath sounds: Normal breath sounds. No wheezing or rales.  Abdominal:     General: Bowel sounds are normal. There is no distension.     Palpations: Abdomen is soft.     Tenderness: There is no abdominal tenderness. There is no rebound.  Musculoskeletal:     Cervical back: Normal range of motion.  Skin:    General: Skin is warm and dry.  Neurological:     Mental Status: She is alert and oriented to person, place, and time.     Coordination: Coordination normal.     Vitals:   07/14/19 0813  BP: 126/82  Pulse: 86  Temp: 98.9 F (37.2 C)  TempSrc: Oral  SpO2: 95%  Weight: 225 lb (102.1 kg)  Height: 5\' 9"  (1.753 m)    This visit occurred during the SARS-CoV-2 public health emergency.  Safety protocols were in place, including screening questions prior to the visit, additional usage of staff PPE, and extensive cleaning of exam room while observing appropriate contact time as indicated for  disinfecting solutions.   Assessment & Plan:

## 2019-07-14 NOTE — Patient Instructions (Signed)
Health Maintenance, Female Adopting a healthy lifestyle and getting preventive care are important in promoting health and wellness. Ask your health care provider about:  The right schedule for you to have regular tests and exams.  Things you can do on your own to prevent diseases and keep yourself healthy. What should I know about diet, weight, and exercise? Eat a healthy diet   Eat a diet that includes plenty of vegetables, fruits, low-fat dairy products, and lean protein.  Do not eat a lot of foods that are high in solid fats, added sugars, or sodium. Maintain a healthy weight Body mass index (BMI) is used to identify weight problems. It estimates body fat based on height and weight. Your health care provider can help determine your BMI and help you achieve or maintain a healthy weight. Get regular exercise Get regular exercise. This is one of the most important things you can do for your health. Most adults should:  Exercise for at least 150 minutes each week. The exercise should increase your heart rate and make you sweat (moderate-intensity exercise).  Do strengthening exercises at least twice a week. This is in addition to the moderate-intensity exercise.  Spend less time sitting. Even light physical activity can be beneficial. Watch cholesterol and blood lipids Have your blood tested for lipids and cholesterol at 63 years of age, then have this test every 5 years. Have your cholesterol levels checked more often if:  Your lipid or cholesterol levels are high.  You are older than 63 years of age.  You are at high risk for heart disease. What should I know about cancer screening? Depending on your health history and family history, you may need to have cancer screening at various ages. This may include screening for:  Breast cancer.  Cervical cancer.  Colorectal cancer.  Skin cancer.  Lung cancer. What should I know about heart disease, diabetes, and high blood  pressure? Blood pressure and heart disease  High blood pressure causes heart disease and increases the risk of stroke. This is more likely to develop in people who have high blood pressure readings, are of African descent, or are overweight.  Have your blood pressure checked: ? Every 3-5 years if you are 18-39 years of age. ? Every year if you are 40 years old or older. Diabetes Have regular diabetes screenings. This checks your fasting blood sugar level. Have the screening done:  Once every three years after age 40 if you are at a normal weight and have a low risk for diabetes.  More often and at a younger age if you are overweight or have a high risk for diabetes. What should I know about preventing infection? Hepatitis B If you have a higher risk for hepatitis B, you should be screened for this virus. Talk with your health care provider to find out if you are at risk for hepatitis B infection. Hepatitis C Testing is recommended for:  Everyone born from 1945 through 1965.  Anyone with known risk factors for hepatitis C. Sexually transmitted infections (STIs)  Get screened for STIs, including gonorrhea and chlamydia, if: ? You are sexually active and are younger than 63 years of age. ? You are older than 63 years of age and your health care provider tells you that you are at risk for this type of infection. ? Your sexual activity has changed since you were last screened, and you are at increased risk for chlamydia or gonorrhea. Ask your health care provider if   you are at risk.  Ask your health care provider about whether you are at high risk for HIV. Your health care provider may recommend a prescription medicine to help prevent HIV infection. If you choose to take medicine to prevent HIV, you should first get tested for HIV. You should then be tested every 3 months for as long as you are taking the medicine. Pregnancy  If you are about to stop having your period (premenopausal) and  you may become pregnant, seek counseling before you get pregnant.  Take 400 to 800 micrograms (mcg) of folic acid every day if you become pregnant.  Ask for birth control (contraception) if you want to prevent pregnancy. Osteoporosis and menopause Osteoporosis is a disease in which the bones lose minerals and strength with aging. This can result in bone fractures. If you are 65 years old or older, or if you are at risk for osteoporosis and fractures, ask your health care provider if you should:  Be screened for bone loss.  Take a calcium or vitamin D supplement to lower your risk of fractures.  Be given hormone replacement therapy (HRT) to treat symptoms of menopause. Follow these instructions at home: Lifestyle  Do not use any products that contain nicotine or tobacco, such as cigarettes, e-cigarettes, and chewing tobacco. If you need help quitting, ask your health care provider.  Do not use street drugs.  Do not share needles.  Ask your health care provider for help if you need support or information about quitting drugs. Alcohol use  Do not drink alcohol if: ? Your health care provider tells you not to drink. ? You are pregnant, may be pregnant, or are planning to become pregnant.  If you drink alcohol: ? Limit how much you use to 0-1 drink a day. ? Limit intake if you are breastfeeding.  Be aware of how much alcohol is in your drink. In the [place] one drink equals one 12 oz bottle of beer (355 mL), one 5 oz glass of wine (148 mL), or one 1 oz glass of hard liquor (44 mL). General instructions  Schedule regular health, dental, and eye exams.  Stay current with your vaccines.  Tell your health care provider if: ? You often feel depressed. ? You have ever been abused or do not feel safe at home. Summary  Adopting a healthy lifestyle and getting preventive care are important in promoting health and wellness.  Follow your health care provider's instructions about healthy  diet, exercising, and getting tested or screened for diseases.  Follow your health care provider's instructions on monitoring your cholesterol and blood pressure. This information is not intended to replace advice given to you by your health care provider. Make sure you discuss any questions you have with your health care provider. Document Revised: 01/06/2018 Document Reviewed: 01/06/2018 Elsevier Patient Education  2020 Elsevier Inc.  

## 2019-07-14 NOTE — Patient Instructions (Addendum)
Pre visit review using our clinic review tool, if applicable. No additional management support is needed unless otherwise documented below in the visit note.  Continue 2 tablets daily except 1 tablet on Sun, Tues and Thurs and Saturdays. Re-check in 6 weeks. 

## 2019-07-14 NOTE — Assessment & Plan Note (Signed)
Lifelong coumadin and continue with monitoring.

## 2019-08-25 ENCOUNTER — Other Ambulatory Visit: Payer: Self-pay

## 2019-08-25 ENCOUNTER — Ambulatory Visit (INDEPENDENT_AMBULATORY_CARE_PROVIDER_SITE_OTHER): Payer: No Typology Code available for payment source | Admitting: General Practice

## 2019-08-25 DIAGNOSIS — Z7901 Long term (current) use of anticoagulants: Secondary | ICD-10-CM

## 2019-08-25 LAB — POCT INR: INR: 2.4 (ref 2.0–3.0)

## 2019-08-25 NOTE — Patient Instructions (Signed)
Pre visit review using our clinic review tool, if applicable. No additional management support is needed unless otherwise documented below in the visit note.  Continue 2 tablets daily except 1 tablet on Sun, Tues and Thurs and Saturdays. Re-check in 6 weeks. 

## 2019-08-25 NOTE — Progress Notes (Signed)
Medical treatment/procedure(s) were performed by non-physician practitioner and as supervising physician I was immediately available for consultation/collaboration. I agree with above. Aikam Vinje A Ameer Sanden, MD  

## 2019-10-06 ENCOUNTER — Ambulatory Visit (INDEPENDENT_AMBULATORY_CARE_PROVIDER_SITE_OTHER): Payer: No Typology Code available for payment source | Admitting: General Practice

## 2019-10-06 ENCOUNTER — Other Ambulatory Visit: Payer: Self-pay

## 2019-10-06 DIAGNOSIS — E041 Nontoxic single thyroid nodule: Secondary | ICD-10-CM

## 2019-10-06 DIAGNOSIS — Z7901 Long term (current) use of anticoagulants: Secondary | ICD-10-CM | POA: Diagnosis not present

## 2019-10-06 LAB — POCT INR: INR: 1.9 — AB (ref 2.0–3.0)

## 2019-10-06 NOTE — Patient Instructions (Addendum)
Pre visit review using our clinic review tool, if applicable. No additional management support is needed unless otherwise documented below in the visit note.  Take 10 mg today and then continue 2 tablets daily except 1 tablet on Sun, Tues and Thurs and Saturdays.  Re-check in 6 weeks.

## 2019-10-06 NOTE — Progress Notes (Signed)
Medical screening examination/treatment/procedure(s) were performed by non-physician practitioner and as supervising physician I was immediately available for consultation/collaboration. I agree with above. Zebulin Siegel, MD   

## 2019-11-17 ENCOUNTER — Other Ambulatory Visit: Payer: Self-pay

## 2019-11-17 ENCOUNTER — Ambulatory Visit (INDEPENDENT_AMBULATORY_CARE_PROVIDER_SITE_OTHER): Payer: No Typology Code available for payment source | Admitting: General Practice

## 2019-11-17 DIAGNOSIS — Z7901 Long term (current) use of anticoagulants: Secondary | ICD-10-CM | POA: Diagnosis not present

## 2019-11-17 LAB — POCT INR: INR: 2.7 (ref 2.0–3.0)

## 2019-11-17 NOTE — Progress Notes (Signed)
Medical screening examination/treatment/procedure(s) were performed by non-physician practitioner and as supervising physician I was immediately available for consultation/collaboration. I agree with above. Medrith Veillon, MD   

## 2019-11-17 NOTE — Patient Instructions (Signed)
Pre visit review using our clinic review tool, if applicable. No additional management support is needed unless otherwise documented below in the visit note.  Continue 2 tablets daily except 1 tablet on Sun, Tues and Thurs and Saturdays. Re-check in 6 weeks. 

## 2019-12-29 ENCOUNTER — Ambulatory Visit (INDEPENDENT_AMBULATORY_CARE_PROVIDER_SITE_OTHER): Payer: No Typology Code available for payment source | Admitting: General Practice

## 2019-12-29 ENCOUNTER — Other Ambulatory Visit: Payer: Self-pay

## 2019-12-29 DIAGNOSIS — Z7901 Long term (current) use of anticoagulants: Secondary | ICD-10-CM | POA: Diagnosis not present

## 2019-12-29 LAB — POCT INR: INR: 2.1 (ref 2.0–3.0)

## 2019-12-29 NOTE — Progress Notes (Signed)
Medical treatment/procedure(s) were performed by non-physician practitioner and as supervising physician I was immediately available for consultation/collaboration. I agree with above. Skyah Hannon A Maeson Lourenco, MD  

## 2019-12-29 NOTE — Patient Instructions (Addendum)
Pre visit review using our clinic review tool, if applicable. No additional management support is needed unless otherwise documented below in the visit note.  Continue 2 tablets daily except 1 tablet on Sun, Tues and Thurs and Saturdays. Re-check in 6 weeks. 

## 2020-02-16 ENCOUNTER — Ambulatory Visit (INDEPENDENT_AMBULATORY_CARE_PROVIDER_SITE_OTHER): Payer: No Typology Code available for payment source | Admitting: General Practice

## 2020-02-16 ENCOUNTER — Other Ambulatory Visit: Payer: Self-pay

## 2020-02-16 DIAGNOSIS — Z7901 Long term (current) use of anticoagulants: Secondary | ICD-10-CM | POA: Diagnosis not present

## 2020-02-16 LAB — POCT INR: INR: 2.2 (ref 2.0–3.0)

## 2020-02-16 NOTE — Progress Notes (Signed)
Medical treatment/procedure(s) were performed by non-physician practitioner and as supervising physician I was immediately available for consultation/collaboration. I agree with above. Dillan Lunden A Finlee Milo, MD  

## 2020-02-16 NOTE — Patient Instructions (Addendum)
Pre visit review using our clinic review tool, if applicable. No additional management support is needed unless otherwise documented below in the visit note.  Continue 2 tablets daily except 1 tablet on Sun, Tues and Thurs and Saturdays.  Re-check in 8 weeks.

## 2020-02-21 IMAGING — MG DIGITAL SCREENING BILATERAL MAMMOGRAM WITH CAD
4 series · 4 of 4 positions shown · non-contrast
Comparison: Previous exam(s).

CLINICAL DATA: Screening.

EXAM:
DIGITAL SCREENING BILATERAL MAMMOGRAM WITH CAD

[L CC]
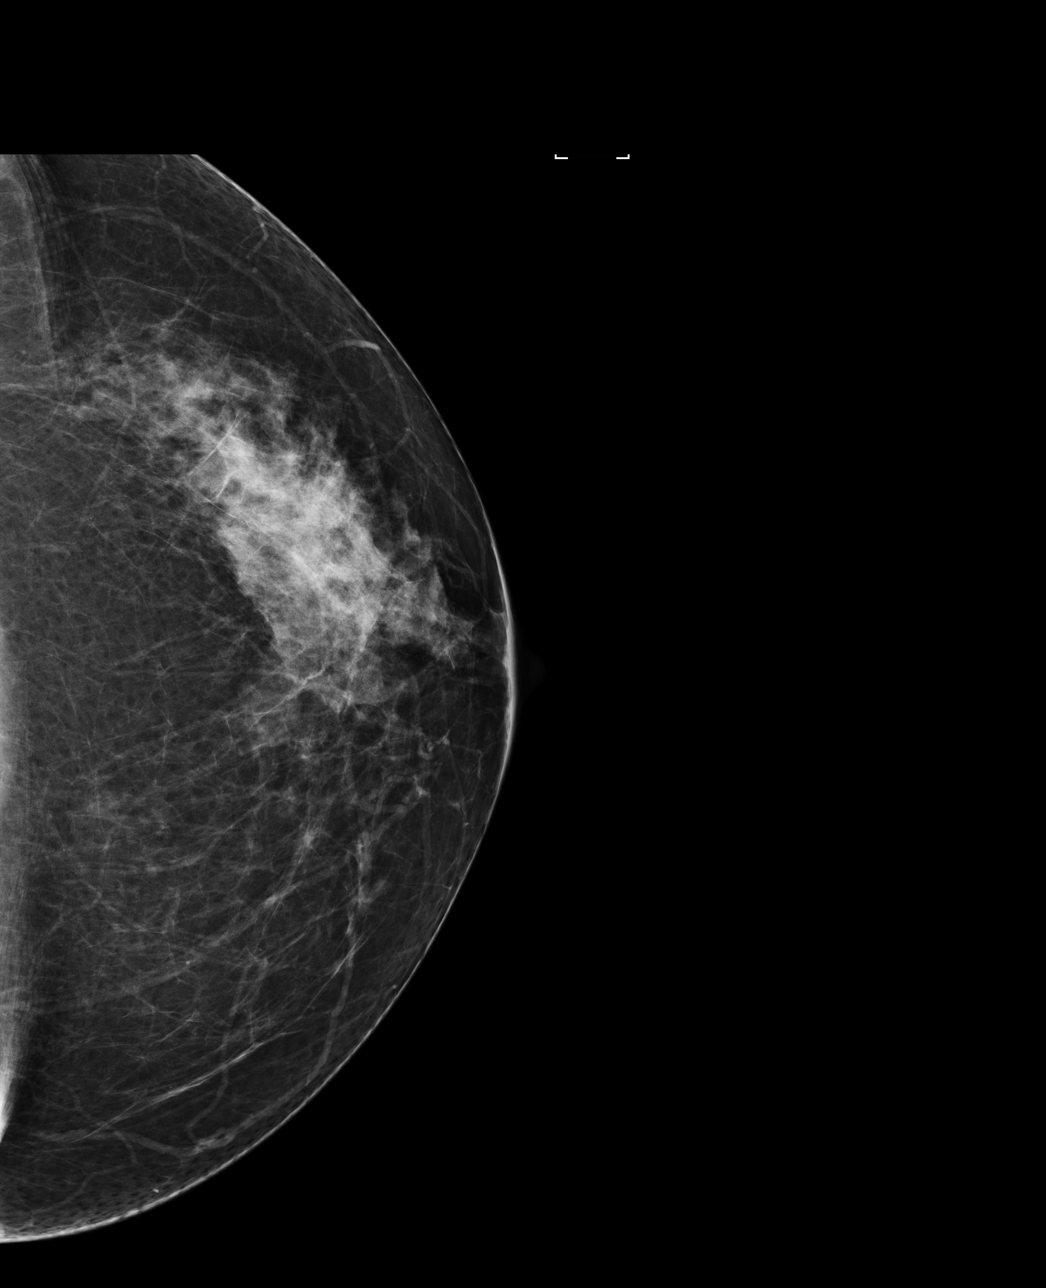

[L MLO]
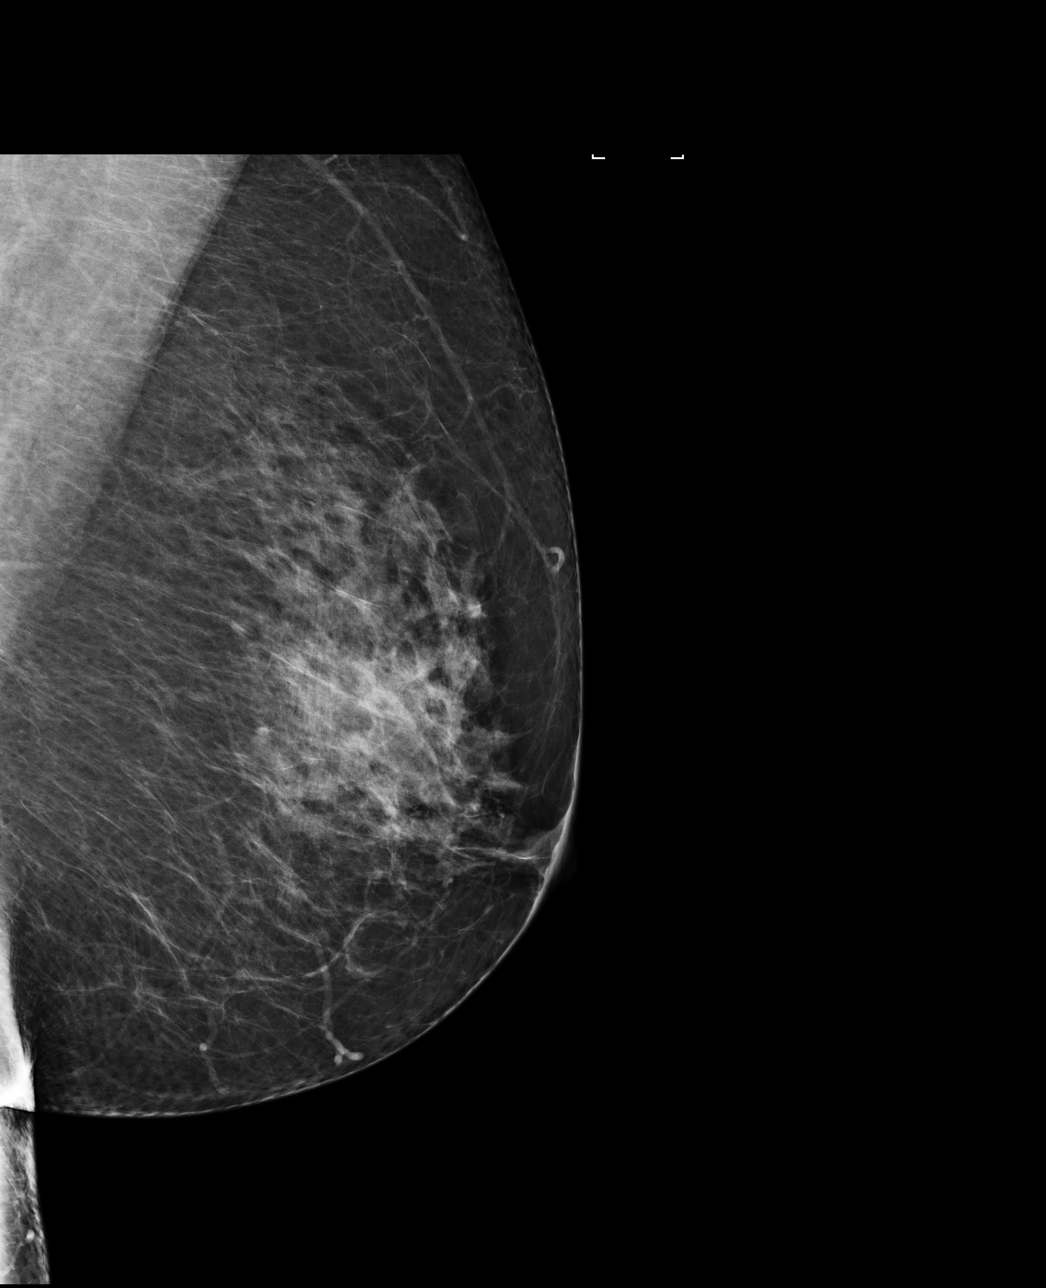

[R CC]
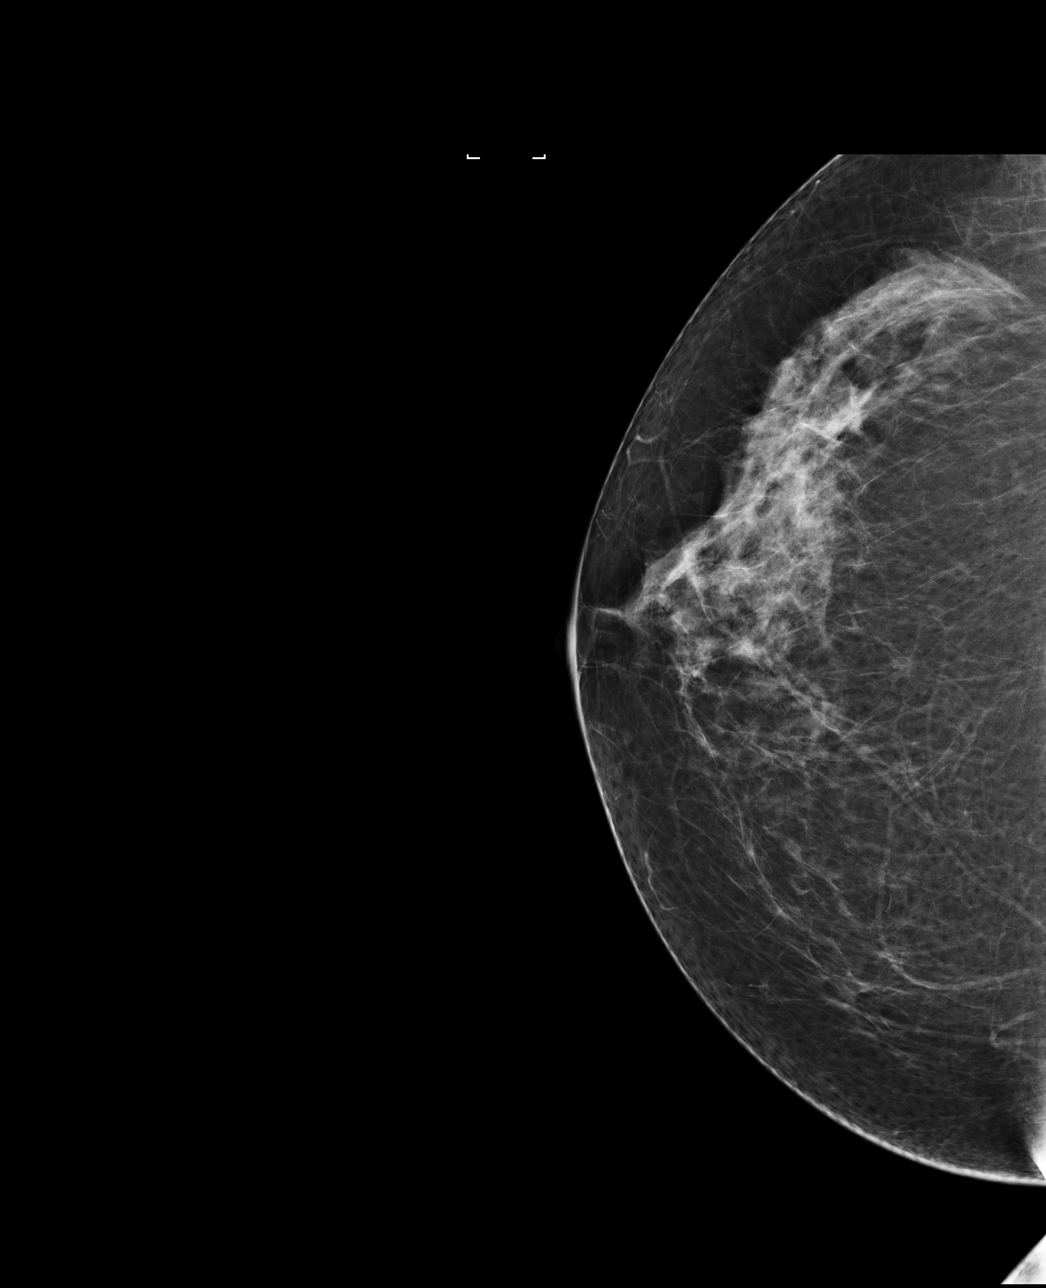

[R MLO]
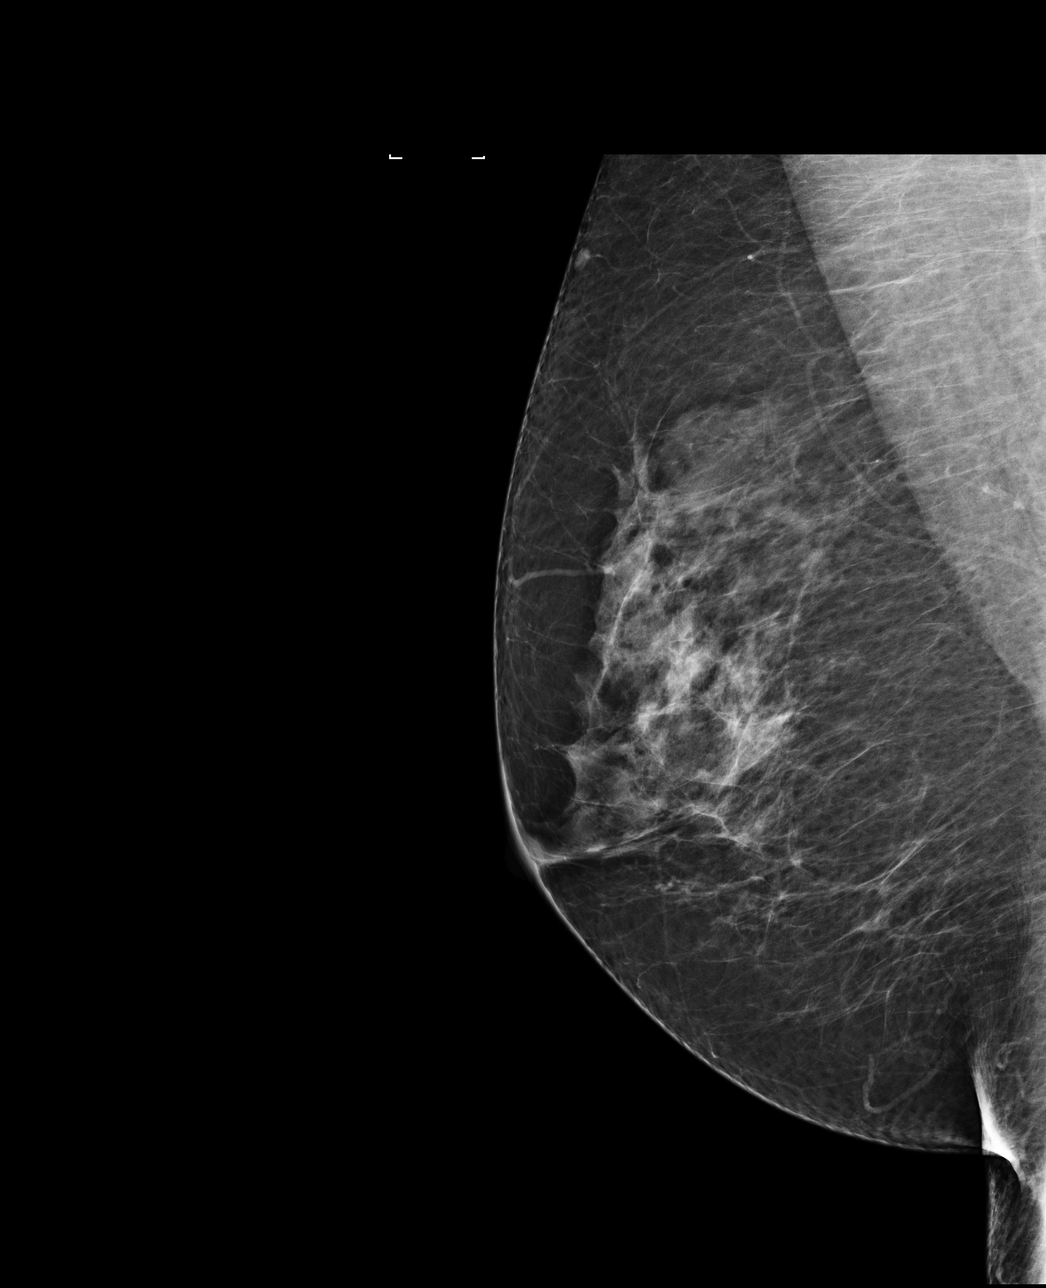

[4 of 4 positions shown; findings below may reference images not displayed]

ACR Breast Density Category c: The breast tissue is heterogeneously
dense, which may obscure small masses.
FINDINGS: In the right breast, possible distortion warrants further
evaluation. In the left breast, no findings suspicious for
malignancy. Images were processed with CAD.
IMPRESSION: Further evaluation is suggested for possible distortion in the right
breast.

RECOMMENDATION:
Diagnostic mammogram and possibly ultrasound of the right breast.
(Code:9G-P-RRO)

The patient will be contacted regarding the findings, and additional
imaging will be scheduled.

BI-RADS CATEGORY  0: Incomplete. Need additional imaging evaluation
and/or prior mammograms for comparison.

## 2020-02-23 ENCOUNTER — Ambulatory Visit: Payer: No Typology Code available for payment source

## 2020-03-12 ENCOUNTER — Other Ambulatory Visit: Payer: Self-pay | Admitting: Internal Medicine

## 2020-04-12 ENCOUNTER — Ambulatory Visit (INDEPENDENT_AMBULATORY_CARE_PROVIDER_SITE_OTHER): Payer: No Typology Code available for payment source | Admitting: General Practice

## 2020-04-12 ENCOUNTER — Other Ambulatory Visit: Payer: Self-pay

## 2020-04-12 DIAGNOSIS — Z7901 Long term (current) use of anticoagulants: Secondary | ICD-10-CM

## 2020-04-12 LAB — POCT INR: INR: 2.4 (ref 2.0–3.0)

## 2020-04-12 NOTE — Patient Instructions (Addendum)
Pre visit review using our clinic review tool, if applicable. No additional management support is needed unless otherwise documented below in the visit note.  Continue 2 tablets daily except 1 tablet on Sun, Tues and Thurs and Saturdays.  Re-check in 8 weeks. 

## 2020-04-12 NOTE — Progress Notes (Signed)
Medical treatment/procedure(s) were performed by non-physician practitioner and as supervising physician I was immediately available for consultation/collaboration. I agree with above. Elizabeth A Crawford, MD  

## 2020-05-28 ENCOUNTER — Ambulatory Visit: Payer: No Typology Code available for payment source

## 2020-05-30 ENCOUNTER — Ambulatory Visit (INDEPENDENT_AMBULATORY_CARE_PROVIDER_SITE_OTHER): Payer: No Typology Code available for payment source | Admitting: General Practice

## 2020-05-30 ENCOUNTER — Other Ambulatory Visit: Payer: Self-pay

## 2020-05-30 DIAGNOSIS — Z7901 Long term (current) use of anticoagulants: Secondary | ICD-10-CM

## 2020-05-30 LAB — POCT INR: INR: 2.1 (ref 2.0–3.0)

## 2020-05-30 NOTE — Patient Instructions (Addendum)
Pre visit review using our clinic review tool, if applicable. No additional management support is needed unless otherwise documented below in the visit note.  Continue 2 tablets daily except 1 tablet on Sun, Tues and Thurs and Saturdays.  Re-check in 8 weeks.

## 2020-05-30 NOTE — Progress Notes (Signed)
I have reviewed the results and agree with this plan   

## 2020-06-07 ENCOUNTER — Ambulatory Visit: Payer: No Typology Code available for payment source

## 2020-07-24 ENCOUNTER — Other Ambulatory Visit: Payer: Self-pay

## 2020-07-24 ENCOUNTER — Ambulatory Visit (INDEPENDENT_AMBULATORY_CARE_PROVIDER_SITE_OTHER): Payer: No Typology Code available for payment source | Admitting: General Practice

## 2020-07-24 DIAGNOSIS — Z7901 Long term (current) use of anticoagulants: Secondary | ICD-10-CM | POA: Diagnosis not present

## 2020-07-24 LAB — POCT INR: INR: 2 (ref 2.0–3.0)

## 2020-07-24 NOTE — Patient Instructions (Addendum)
Pre visit review using our clinic review tool, if applicable. No additional management support is needed unless otherwise documented below in the visit note.  Continue 2 tablets daily except 1 tablet on Sun, Tues and Thurs and Saturdays.  Re-check in 8 weeks.

## 2020-07-24 NOTE — Progress Notes (Signed)
Medical treatment/procedure(s) were performed by non-physician practitioner and as supervising physician I was immediately available for consultation/collaboration. I agree with above. Kaneshia Cater A Yaniyah Koors, MD  

## 2020-09-18 ENCOUNTER — Ambulatory Visit (INDEPENDENT_AMBULATORY_CARE_PROVIDER_SITE_OTHER): Payer: No Typology Code available for payment source

## 2020-09-18 ENCOUNTER — Other Ambulatory Visit: Payer: Self-pay

## 2020-09-18 DIAGNOSIS — Z7901 Long term (current) use of anticoagulants: Secondary | ICD-10-CM | POA: Diagnosis not present

## 2020-09-18 LAB — POCT INR: INR: 2.3 (ref 2.0–3.0)

## 2020-09-18 NOTE — Patient Instructions (Addendum)
Pre visit review using our clinic review tool, if applicable. No additional management support is needed unless otherwise documented below in the visit note.  Continue 2 tablets daily except 1 tablet on Sun, Tues and Thurs and Saturdays.  Re-check in 8 weeks.

## 2020-09-19 NOTE — Progress Notes (Signed)
Medical treatment/procedure(s) were performed by non-physician practitioner and as supervising physician I was immediately available for consultation/collaboration. I agree with above. Lizbett Garciagarcia A Tyla Burgner, MD  

## 2020-10-13 ENCOUNTER — Other Ambulatory Visit: Payer: Self-pay | Admitting: Internal Medicine

## 2020-10-17 MED ORDER — WARFARIN SODIUM 5 MG PO TABS: ORAL_TABLET | ORAL | 0 refills | Status: DC

## 2020-10-17 NOTE — Telephone Encounter (Signed)
Pt is compliant with coumadin management. Sent in refill for warfarin/jantoven to mail order pharmacy.  Contacted pt to advise. She reports she will need some sent into [facility] in [place] or she will run out before she gets them in the mail. Advised they will be sent.   Sent in another 30 day supply of warfarin into [facility] in [place].  Contacted [facility] and spoke with Elmyra Ricks and advised a 30 day supply was also sent to [facility] [place] due to pt not having enough to until she gets the it in the mail. Advised if any problems to contact the office. Gave pt number to coumadin clinic. Pt verbalized understanding.

## 2020-10-17 NOTE — Telephone Encounter (Signed)
Please call patient to discuss Warfarin refill

## 2020-10-17 NOTE — Telephone Encounter (Signed)
Contacted mail order pharmacy and advised a 30 day supply was sent to the local pharmacy. Elmyra Ricks verbalized understanding.

## 2020-11-10 ENCOUNTER — Other Ambulatory Visit: Payer: Self-pay | Admitting: Internal Medicine

## 2020-11-13 ENCOUNTER — Other Ambulatory Visit: Payer: Self-pay

## 2020-11-13 ENCOUNTER — Ambulatory Visit (INDEPENDENT_AMBULATORY_CARE_PROVIDER_SITE_OTHER): Payer: No Typology Code available for payment source

## 2020-11-13 DIAGNOSIS — Z7901 Long term (current) use of anticoagulants: Secondary | ICD-10-CM

## 2020-11-13 LAB — POCT INR: INR: 2.2 (ref 2.0–3.0)

## 2020-11-13 NOTE — Patient Instructions (Addendum)
Pre visit review using our clinic review tool, if applicable. No additional management support is needed unless otherwise documented below in the visit note.  Continue 2 tablets daily except 1 tablet on Sun, Tues and Thurs and Saturdays.  Re-check in 8 weeks.

## 2020-11-13 NOTE — Progress Notes (Signed)
Medical treatment/procedure(s) were performed by non-physician practitioner and as supervising physician I was immediately available for consultation/collaboration. I agree with above. Jackquline Branca A Lilton Pare, MD  

## 2021-01-08 ENCOUNTER — Other Ambulatory Visit: Payer: Self-pay

## 2021-01-08 ENCOUNTER — Ambulatory Visit (INDEPENDENT_AMBULATORY_CARE_PROVIDER_SITE_OTHER): Payer: No Typology Code available for payment source

## 2021-01-08 DIAGNOSIS — Z7901 Long term (current) use of anticoagulants: Secondary | ICD-10-CM | POA: Diagnosis not present

## 2021-01-08 LAB — POCT INR: INR: 2.1 (ref 2.0–3.0)

## 2021-01-08 MED ORDER — WARFARIN SODIUM 5 MG PO TABS: ORAL_TABLET | ORAL | 1 refills | Status: DC

## 2021-01-08 NOTE — Progress Notes (Signed)
Continue 2 tablets daily except 1 tablet on Sun, Tues and Thurs and Saturdays.  Re-check in 8 weeks per pt request.

## 2021-01-08 NOTE — Progress Notes (Signed)
Medical treatment/procedure(s) were performed by non-physician practitioner and as supervising physician I was immediately available for consultation/collaboration. I agree with above. Donetta Isaza A Tanyia Grabbe, MD  

## 2021-01-08 NOTE — Patient Instructions (Addendum)
Pre visit review using our clinic review tool, if applicable. No additional management support is needed unless otherwise documented below in the visit note.  Continue 2 tablets daily except 1 tablet on Sun, Tues and Thurs and Saturdays.  Re-check in 8 weeks per pt request.

## 2021-01-11 ENCOUNTER — Other Ambulatory Visit: Payer: Self-pay

## 2021-01-11 ENCOUNTER — Encounter: Payer: Self-pay | Admitting: Internal Medicine

## 2021-01-11 ENCOUNTER — Ambulatory Visit (INDEPENDENT_AMBULATORY_CARE_PROVIDER_SITE_OTHER): Payer: No Typology Code available for payment source | Admitting: Internal Medicine

## 2021-01-11 VITALS — BP 124/90 | HR 62 | Resp 18 | Ht 69.0 in | Wt 263.0 lb

## 2021-01-11 DIAGNOSIS — R799 Abnormal finding of blood chemistry, unspecified: Secondary | ICD-10-CM

## 2021-01-11 DIAGNOSIS — Z1211 Encounter for screening for malignant neoplasm of colon: Secondary | ICD-10-CM | POA: Diagnosis not present

## 2021-01-11 DIAGNOSIS — Z1231 Encounter for screening mammogram for malignant neoplasm of breast: Secondary | ICD-10-CM | POA: Diagnosis not present

## 2021-01-11 DIAGNOSIS — J4520 Mild intermittent asthma, uncomplicated: Secondary | ICD-10-CM

## 2021-01-11 DIAGNOSIS — I82409 Acute embolism and thrombosis of unspecified deep veins of unspecified lower extremity: Secondary | ICD-10-CM | POA: Diagnosis not present

## 2021-01-11 DIAGNOSIS — Z0000 Encounter for general adult medical examination without abnormal findings: Secondary | ICD-10-CM

## 2021-01-11 DIAGNOSIS — E785 Hyperlipidemia, unspecified: Secondary | ICD-10-CM

## 2021-01-11 LAB — COMPREHENSIVE METABOLIC PANEL
ALT: 23 U/L (ref 0–35)
AST: 20 U/L (ref 0–37)
Albumin: 4.3 g/dL (ref 3.5–5.2)
Alkaline Phosphatase: 95 U/L (ref 39–117)
BUN: 18 mg/dL (ref 6–23)
CO2: 30 mEq/L (ref 19–32)
Calcium: 9.9 mg/dL (ref 8.4–10.5)
Chloride: 102 mEq/L (ref 96–112)
Creatinine, Ser: 0.93 mg/dL (ref 0.40–1.20)
GFR: 65 mL/min (ref >60.00)
Glucose, Bld: 103 mg/dL — ABNORMAL HIGH (ref 70–99)
Potassium: 4.2 mEq/L (ref 3.5–5.1)
Sodium: 141 mEq/L (ref 135–145)
Total Bilirubin: 0.5 mg/dL (ref 0.2–1.2)
Total Protein: 7 g/dL (ref 6.0–8.3)

## 2021-01-11 LAB — LIPID PANEL
Cholesterol: 256 mg/dL — ABNORMAL HIGH (ref 0–200)
HDL: 60.6 mg/dL (ref >39.00)
NonHDL: 195.3
Total CHOL/HDL Ratio: 4
Triglycerides: 227 mg/dL — ABNORMAL HIGH (ref 0.0–149.0)
VLDL: 45.4 mg/dL — ABNORMAL HIGH (ref 0.0–40.0)

## 2021-01-11 LAB — CBC
HCT: 42.9 % (ref 36.0–46.0)
Hemoglobin: 14.2 g/dL (ref 12.0–15.0)
MCHC: 33.1 g/dL (ref 30.0–36.0)
MCV: 90.8 fl (ref 78.0–100.0)
Platelets: 206 10*3/uL (ref 150.0–400.0)
RBC: 4.72 Mil/uL (ref 3.87–5.11)
RDW: 13.1 % (ref 11.5–15.5)
WBC: 7.2 10*3/uL (ref 4.0–10.5)

## 2021-01-11 LAB — VITAMIN D 25 HYDROXY (VIT D DEFICIENCY, FRACTURES): VITD: 27.97 ng/mL — ABNORMAL LOW (ref 30.00–100.00)

## 2021-01-11 LAB — HEMOGLOBIN A1C: Hgb A1c MFr Bld: 6.3 % (ref 4.6–6.5)

## 2021-01-11 LAB — LDL CHOLESTEROL, DIRECT: Direct LDL: 174 mg/dL

## 2021-01-11 MED ORDER — CLOBETASOL PROPIONATE 0.05 % EX OINT: 1.0000 "application " | TOPICAL_OINTMENT | Freq: Two times a day (BID) | CUTANEOUS | 0 refills | Status: DC

## 2021-01-11 NOTE — Progress Notes (Signed)
° °  Subjective:   Patient ID: Megan Parrish, female    DOB: 10-13-1955, 65 y.o.   MRN: [id number]  HPI The patient is a 65 YO female coming in for physical.   PMH, [place], social history reviewed and updated  Review of Systems  Constitutional: Negative.   HENT: Negative.    Eyes: Negative.   Respiratory:  Negative for cough, chest tightness and shortness of breath.   Cardiovascular:  Negative for chest pain, palpitations and leg swelling.  Gastrointestinal:  Negative for abdominal distention, abdominal pain, constipation, diarrhea, nausea and vomiting.  Musculoskeletal: Negative.   Skin: Negative.   Neurological: Negative.   Psychiatric/Behavioral: Negative.     Objective:  Physical Exam Constitutional:      Appearance: She is well-developed. She is obese.  HENT:     Head: Normocephalic and atraumatic.  Cardiovascular:     Rate and Rhythm: Normal rate and regular rhythm.  Pulmonary:     Effort: Pulmonary effort is normal. No respiratory distress.     Breath sounds: Normal breath sounds. No wheezing or rales.  Abdominal:     General: Bowel sounds are normal. There is no distension.     Palpations: Abdomen is soft.     Tenderness: There is no abdominal tenderness. There is no rebound.  Musculoskeletal:     Cervical back: Normal range of motion.  Skin:    General: Skin is warm and dry.  Neurological:     Mental Status: She is alert and oriented to person, place, and time.     Coordination: Coordination normal.    Vitals:   01/11/21 1511  BP: 124/90  Pulse: 62  Resp: 18  SpO2: 96%  Weight: 263 lb (119.3 kg)  Height: 5\' 9"  (1.753 m)    This visit occurred during the SARS-CoV-2 public health emergency.  Safety protocols were in place, including screening questions prior to the visit, additional usage of staff PPE, and extensive cleaning of exam room while observing appropriate contact time as indicated for disinfecting solutions.   Assessment & Plan:

## 2021-01-11 NOTE — Assessment & Plan Note (Signed)
Continue warfarin lifelong for DVT prevention. No current DVT.

## 2021-01-11 NOTE — Assessment & Plan Note (Signed)
Uses albuterol prn. Advised covid-19 booster and flu.

## 2021-01-11 NOTE — Patient Instructions (Signed)
We will check the labs. Think about the shingles vaccine. We will send the cologuard and get the mammogram done.

## 2021-01-11 NOTE — Assessment & Plan Note (Signed)
Flu shot declines. Covid-19 counseled. Shingrix counseled. Tetanus counseled. Cologuard ordered. Mammogram ordered, pap smear up to date. Counseled about sun safety and mole surveillance. Counseled about the dangers of distracted driving. Given 10 year screening recommendations.  dec

## 2021-01-11 NOTE — Assessment & Plan Note (Signed)
Checking lipid panel not on medication.

## 2021-02-01 ENCOUNTER — Other Ambulatory Visit: Payer: Self-pay | Admitting: Internal Medicine

## 2021-02-04 ENCOUNTER — Ambulatory Visit
Admission: RE | Admit: 2021-02-04 | Discharge: 2021-02-04 | Disposition: A | Discharge status: Home / Self Care | Payer: No Typology Code available for payment source | Source: Ambulatory Visit | Attending: Internal Medicine | Admitting: Internal Medicine

## 2021-02-04 ENCOUNTER — Other Ambulatory Visit: Payer: Self-pay

## 2021-02-04 DIAGNOSIS — Z1231 Encounter for screening mammogram for malignant neoplasm of breast: Secondary | ICD-10-CM

## 2021-02-22 ENCOUNTER — Encounter: Payer: Self-pay | Admitting: Gastroenterology

## 2021-02-22 ENCOUNTER — Other Ambulatory Visit: Payer: Self-pay | Admitting: Internal Medicine

## 2021-02-22 DIAGNOSIS — R195 Other fecal abnormalities: Secondary | ICD-10-CM

## 2021-02-22 LAB — COLOGUARD: COLOGUARD: POSITIVE — AB

## 2021-03-05 ENCOUNTER — Ambulatory Visit (INDEPENDENT_AMBULATORY_CARE_PROVIDER_SITE_OTHER): Payer: No Typology Code available for payment source

## 2021-03-05 ENCOUNTER — Other Ambulatory Visit: Payer: Self-pay

## 2021-03-05 DIAGNOSIS — Z7901 Long term (current) use of anticoagulants: Secondary | ICD-10-CM | POA: Diagnosis not present

## 2021-03-05 LAB — POCT INR: INR: 2.3 (ref 2.0–3.0)

## 2021-03-05 NOTE — Patient Instructions (Signed)
Pre visit review using our clinic review tool, if applicable. No additional management support is needed unless otherwise documented below in the visit note. 

## 2021-03-05 NOTE — Progress Notes (Signed)
Continue 2 tablets daily except 1 tablet on Sun, Tues and Thurs and Saturdays.  Re-check in 8 weeks per pt request.

## 2021-03-12 ENCOUNTER — Ambulatory Visit (INDEPENDENT_AMBULATORY_CARE_PROVIDER_SITE_OTHER): Payer: No Typology Code available for payment source | Admitting: Gastroenterology

## 2021-03-12 ENCOUNTER — Encounter: Payer: Self-pay | Admitting: Gastroenterology

## 2021-03-12 VITALS — BP 122/70 | HR 94 | Ht 68.0 in | Wt 261.0 lb

## 2021-03-12 DIAGNOSIS — Z7901 Long term (current) use of anticoagulants: Secondary | ICD-10-CM

## 2021-03-12 DIAGNOSIS — R195 Other fecal abnormalities: Secondary | ICD-10-CM | POA: Diagnosis not present

## 2021-03-12 MED ORDER — NA SULFATE-K SULFATE-MG SULF 17.5-3.13-1.6 GM/177ML PO SOLN: 1.0000 | Freq: Once | ORAL | 0 refills | Status: AC

## 2021-03-12 NOTE — Progress Notes (Signed)
Agree with assessment and plan as outlined.  

## 2021-03-12 NOTE — Progress Notes (Signed)
03/12/2021 Megan Parrish [id number] [birth year]   HISTORY OF PRESENT ILLNESS: This is a 66 year old female who has been referred here by Dr. [name] for positive Cologuard study.  She never had a colonoscopy in the past, but had a Cologuard 3 years ago that was negative.  Her most recent one was positive, however.  She denies any rectal bleeding.  She denies any issues with her bowel habits.  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  She has had to hold the Coumadin previously for knee replacement surgeries, etc. and is usually bridged to Lovenox.   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   LLE 1990; RLE 7/09 & 1/11   Factor V deficiency ([place])    chronic anticoag    Fibroid    GERD    Palpitations    Plantar fasciitis 2017   THYROID NODULE 08/2008   Past Surgical History:  Procedure Laterality Date   perianal cyst removal  2001   TOTAL KNEE ARTHROPLASTY Left 01/31/2019   Procedure: TOTAL KNEE ARTHROPLASTY;  Surgeon: Gaynelle Arabian, MD;  Location: [facility];  Service: Orthopedics;  Laterality: Left;  60min   TOTAL KNEE ARTHROPLASTY Right 05/02/2019   Procedure: TOTAL KNEE ARTHROPLASTY [facility];  Surgeon: Gaynelle Arabian, MD;  Location: [facility];  Service: Orthopedics;  Laterality: Right;  35min   WISDOM TOOTH EXTRACTION Left 11/30/2018    reports that she quit smoking about 12 years ago. Her smoking use included cigarettes. She has a 30.00 pack-year smoking history. She has never used smokeless tobacco. She reports current alcohol use. She reports that she does not use drugs. family history includes Arthritis in an other family member; Dementia in her father; Endocrine tumor in her sister; Hypertension in her father and mother; Leukemia in her sister; Ovarian cancer in her maternal grandmother; Thyroid disease in her mother; Uterine  cancer in an other family member. Allergies  Allergen Reactions   Codeine Nausea And Vomiting   Other Hives and Other (See Comments)    Animal Dander: Respiratory distress      Outpatient Encounter Medications as of 03/12/2021  Medication Sig   albuterol (PROAIR HFA) 108 (90 Base) MCG/ACT inhaler Inhale 2 puffs into the lungs every 4 (four) hours as needed.   Cholecalciferol (VITAMIN D) 50 MCG (2000 UT) tablet Take 2,000 Units by mouth daily.   esomeprazole (NEXIUM) 20 MG capsule Take 20 mg by mouth daily at 12 noon.    triamcinolone cream (KENALOG) 0.1 % Apply 1 application topically 2 (two) times daily.   warfarin (COUMADIN) 5 MG tablet TAKE 1 TABLET DAILY EXCEPT TAKE 2 TABLETS ON MONDAYS, WEDNESDAYS & FRIDAYS OR AS DIRECTED BY CLINIC   [DISCONTINUED] clobetasol ointment (TEMOVATE) 5.27 % Apply 1 application topically 2 (two) times daily.   No facility-administered encounter medications on file as of 03/12/2021.     REVIEW OF SYSTEMS  : All other systems reviewed and negative except where noted in the History of Present Illness.   PHYSICAL EXAM: BP 122/70    Pulse 94    Ht 5\' 8"  (1.727 m)    Wt 261 lb (118.4 kg)    LMP 05/19/2015    BMI 39.68 kg/m  General: Well developed white female in no acute distress Head: Normocephalic and atraumatic  Eyes:  Sclerae anicteric, conjunctiva pink. Ears: Normal auditory acuity Lungs: Clear throughout to auscultation; no W/R/R. Heart: Regular rate and rhythm; no M/R/G. Abdomen: Soft, non-distended.  BS present.  Non-tender. Rectal:  Will be done at the time of colonoscopy. Musculoskeletal: Symmetrical with no gross deformities  Skin: No lesions on visible extremities Extremities: No edema  Neurological: Alert oriented x 4, grossly non-focal Psychological:  Alert and cooperative. Normal mood and affect  ASSESSMENT AND PLAN: *Positive cologuard:  Had a negative cologuard 3 years ago and a positive one this year.  Hgb normal.  No blood in  stool or bowel habit issues.  Never had a colonoscopy in the past.  Will schedule colonoscpoy with Dr. Havery Moros. *Chronic anticoagulation with coumadin due to Factor V deficiency and history of clots.  Will hold coumadin for 5 days prior to endoscopic procedures - will instruct when and how to resume after procedure. Benefits and risks of procedure explained including risks of bleeding, perforation, infection, missed lesions, reactions to medications and possible need for hospitalization and surgery for complications. Additional rare but real risk of stroke or other vascular clotting events off of coumadin also explained and need to seek urgent help if any signs of these problems occur. Will communicate by phone or EMR with patient's  prescribing provider, Dr. [name], to confirm that holding coumadin is reasonable in this case.  Will likely need lovenox bridge.   CC:  Hoyt Koch, *

## 2021-03-12 NOTE — Patient Instructions (Signed)
You have been scheduled for a colonoscopy. Please follow written instructions given to you at your visit today.  Please pick up your prep supplies at the pharmacy within the next 1-3 days. If you use inhalers (even only as needed), please bring them with you on the day of your procedure.  You will be contaced by our office prior to your procedure for directions on holding your Coumadin/Warfarin.  If you do not hear from our office 1 week prior to your scheduled procedure, please call [phone] to discuss.  If you are age 42 or older, your body mass index should be between 23-30. Your Body mass index is 39.68 kg/m. If this is out of the aforementioned range listed, please consider follow up with your Primary Care Provider.  If you are age 61 or younger, your body mass index should be between 19-25. Your Body mass index is 39.68 kg/m. If this is out of the aformentioned range listed, please consider follow up with your Primary Care Provider.   ________________________________________________________  The  GI providers would like to encourage you to use [place] to communicate with providers for non-urgent requests or questions.  Due to long hold times on the telephone, sending your provider a message by [facility] may be a faster and more efficient way to get a response.  Please allow 48 business hours for a response.  Please remember that this is for non-urgent requests.  _______________________________________________________

## 2021-03-30 ENCOUNTER — Other Ambulatory Visit: Payer: Self-pay | Admitting: Internal Medicine

## 2021-04-30 ENCOUNTER — Telehealth: Payer: Self-pay

## 2021-04-30 ENCOUNTER — Ambulatory Visit (INDEPENDENT_AMBULATORY_CARE_PROVIDER_SITE_OTHER): Payer: No Typology Code available for payment source

## 2021-04-30 DIAGNOSIS — Z7901 Long term (current) use of anticoagulants: Secondary | ICD-10-CM

## 2021-04-30 LAB — POCT INR: INR: 2 (ref 2.0–3.0)

## 2021-04-30 NOTE — Telephone Encounter (Signed)
I approve the bridge, thanks ?

## 2021-04-30 NOTE — Telephone Encounter (Signed)
Pt in today for INR check and reports she is scheduled for colonoscopy on 4/17 and will need lovenox bridge. Advised this nurse will make a schedule for lovenox bridge and f/u with her after the provider assesses.  ? ?Pt will get instructions per mychart and will not need INR checked before colonoscopy. ? ? ?Wt: 119.3 kg; Adjusted Wt:  ?CrCl: 83.93 mL/min using adjusted weight ?Diagnoses: DVT and Factor V Deficiency ? ?Lovenox, '180mg'$ , QD; 7 syringes. Will need two syringes to make the 180 mg dosing. Largest syringe available is 150 mg, which was used for the prior surgery on 05/02/19, because the pt's weight at that time was 104 kg. Current weight for pt now is  119.3 kg. ?Once daily dosing of lovenox should be 1.5 kg/mg.  ?Can send in a combination of two syringes to reach the goal lovenox dosing of 180 mg.  ? ?Proposed Lovenox Bridge: ? ?4/12: Take last dose of coumadin ?4/13: NO coumadin, NO lovenox ?4/14: NO coumadin, Lovenox in AM ?4/15: NO coumadin, Lovenox in AM ?4/16: NO coumadin, Lovenox in AM (Take before 7 AM) ? ?4/17: PROCEDURE; NO COUMADIN, NO LOVENOX ? ?4/18: Take 1 1/2 (7.5 mg) tablets coumadin, Lovenox in AM ?4/19: Take 3 tablets (15 mg) coumadin, Lovenox in AM ?4/20: Take 1 1/2 (7.5 mg) tablets coumadin, Lovenox in AM ?4/21: Take 3 tablets (15 mg) coumadin, Lovenox in AM ?4/22: Stop lovenox, return to normal coumadin dosing; take 1 tablet daily except take 2 tablets on Mondays, Wednesdays, and Fridays.  ? ?Recheck INR on 4/25 ? ? ? ? ? ? ? ? ? ? ? ? ?

## 2021-04-30 NOTE — Progress Notes (Addendum)
Continue 2 tablets daily except 1 tablet on Sun, Tues and Thurs and Saturdays.  Re-check in 3 weeks due to colonoscopy scheduled.  ?

## 2021-04-30 NOTE — Patient Instructions (Addendum)
Pre visit review using our clinic review tool, if applicable. No additional management support is needed unless otherwise documented below in the visit note. ? ?Continue 2 tablets daily except 1 tablet on Sun, Tues and Thurs and Saturdays.  Re-check in 3 weeks due to colonoscopy scheduled.  ?

## 2021-05-01 MED ORDER — ENOXAPARIN SODIUM 30 MG/0.3ML IJ SOSY: PREFILLED_SYRINGE | INTRAMUSCULAR | 0 refills | Status: DC

## 2021-05-01 MED ORDER — ENOXAPARIN SODIUM 150 MG/ML IJ SOSY: PREFILLED_SYRINGE | INTRAMUSCULAR | 0 refills | Status: DC

## 2021-05-01 NOTE — Telephone Encounter (Signed)
Will proceed with lovenox bridge using 180 mg QD. ? ?Contacted pt and advised of dosing and that 2 syringes per day may be needed to accomplish the 180 mg daily. Largest syringe dose has been 150 mg but advised this nurse will contact her pharmacy, [facility] [place], to see if there is another syringe size that would cover the 180 mg daily dose so the pt does not have to use 2 syringes daily. Pt reports she was able to get onto mychart and would like the instructions sent through mychart. Advised if any questions to contact the anticoagulation clinic. Advised this nurse will be out of the office starting tomorrow at 12 pm and will not return until 4/11 at 8 am. Pt will check mychart today to determine if she has any questions.  ? ?Pt will need 2 lovenox syringes daily to make the 180 mg daily dosing, so a total of 14 syringes will be needed. ? ?Event organiser, pharmacist at [facility] [place], who reports she does not have the 300 mg lovenox, which are in vials. They do carry 150 mg and 30 mg lovenox syringes. Advised two scripts would be sent in.  ? ?Sent mychart msg to pt with dosing schedule. ? ? ?

## 2021-05-02 ENCOUNTER — Encounter: Payer: Self-pay | Admitting: Gastroenterology

## 2021-05-05 ENCOUNTER — Encounter: Payer: Self-pay | Admitting: Certified Registered Nurse Anesthetist

## 2021-05-07 ENCOUNTER — Telehealth: Payer: Self-pay | Admitting: *Deleted

## 2021-05-07 NOTE — Telephone Encounter (Signed)
Patient does not have their coumadin managed by [place] ? ?Please call with questions. ? ? ?Emmaline Life, NP-C ? ?  ?05/07/2021, 12:41 PM ?[facility] ?[address], [address] ?Office [phone] Fax [phone] ? ?

## 2021-05-07 NOTE — Telephone Encounter (Signed)
[facility] Pre-operative Risk Assessment  ?   ?Request for surgical clearance:     Endoscopy Procedure ? ?What type of surgery is being performed?     colonoscopy ? ?When is this surgery scheduled?    Monday 05/13/21 ? ?What type of clearance is required ?   Pharmacy ? ?Are there any medications that need to be held prior to surgery and how long? Coumadin 5 days ? ?Practice name and name of physician performing surgery?      [facility] ? ?What is your office phone and fax number?      Phone- [phone]  Fax- [phone] ? ?Anesthesia type (None, local, MAC, general) ?       [facility] ? ?

## 2021-05-07 NOTE — Telephone Encounter (Signed)
Pt has been provided with lovenox bridge instructions and warfarin instructions for pre and post procedure.  ?

## 2021-05-08 NOTE — Telephone Encounter (Signed)
Left message for patient to call office.  

## 2021-05-08 NOTE — Telephone Encounter (Signed)
Spoke with patient and confirmed she had instructions for her Coumadin. ?

## 2021-05-13 ENCOUNTER — Encounter: Payer: Self-pay | Admitting: Gastroenterology

## 2021-05-13 ENCOUNTER — Ambulatory Visit (AMBULATORY_SURGERY_CENTER): Payer: No Typology Code available for payment source | Admitting: Gastroenterology

## 2021-05-13 VITALS — BP 104/78 | HR 75 | Temp 97.5°F | Resp 21 | Ht 68.0 in | Wt 261.0 lb

## 2021-05-13 DIAGNOSIS — D123 Benign neoplasm of transverse colon: Secondary | ICD-10-CM | POA: Diagnosis not present

## 2021-05-13 DIAGNOSIS — R195 Other fecal abnormalities: Secondary | ICD-10-CM | POA: Diagnosis not present

## 2021-05-13 DIAGNOSIS — K648 Other hemorrhoids: Secondary | ICD-10-CM

## 2021-05-13 DIAGNOSIS — K5730 Diverticulosis of large intestine without perforation or abscess without bleeding: Secondary | ICD-10-CM | POA: Diagnosis not present

## 2021-05-13 DIAGNOSIS — D125 Benign neoplasm of sigmoid colon: Secondary | ICD-10-CM

## 2021-05-13 DIAGNOSIS — K635 Polyp of colon: Secondary | ICD-10-CM | POA: Diagnosis not present

## 2021-05-13 MED ORDER — SODIUM CHLORIDE 0.9 % IV SOLN: 500.0000 mL | INTRAVENOUS | Status: DC

## 2021-05-13 NOTE — Progress Notes (Signed)
Called to room to assist during endoscopic procedure.  Patient ID and intended procedure confirmed with present staff. Received instructions for my participation in the procedure from the performing physician.  

## 2021-05-13 NOTE — Progress Notes (Signed)
[facility] History and Physical ? ? ?Primary Care Physician:  Hoyt Koch, MD ? ? ?Reason for Procedure:   (+) cologuard ? ?Plan:    colonoscopy ? ? ? ? ?HPI: Megan Parrish is a 66 y.o. female  here for colonoscopy - first time exam to evaluate positive Cologuard. Patient denies any bowel symptoms at this time. No family history of colon cancer known. Otherwise feels well without any cardiopulmonary symptoms. On coumadin for history of DVT / clots - last dose 5 days ago. On lovenox bridge, last dose yesterday. ? ? ?Past Medical History:  ?Diagnosis Date  ? ALLERGIC RHINITIS   ? Anxiety state, unspecified   ? Arthritis   ? ASTHMA   ? Asthma   ? 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 LLE 1990; RLE 7/09 & 1/11  ? Factor V deficiency ([place])   ? chronic anticoag   ? Fibroid   ? GERD   ? Palpitations   ? Plantar fasciitis 2017  ? THYROID NODULE 08/2008  ? ? ?Past Surgical History:  ?Procedure Laterality Date  ? perianal cyst removal  2001  ? TOTAL KNEE ARTHROPLASTY Left 01/31/2019  ? Procedure: TOTAL KNEE ARTHROPLASTY;  Surgeon: Gaynelle Arabian, MD;  Location: [facility];  Service: Orthopedics;  Laterality: Left;  57mn  ? TOTAL KNEE ARTHROPLASTY Right 05/02/2019  ? Procedure[place]Y [place]  Surgeon: AGaynelle Arabian MD;  Location: [facility];  Service: Orthopedics;  Laterality: Right;  550m  ? WISDOM TOOTH EXTRACTION Left 11/30/2018  ? ? ?Prior to Admission medications   ?Medication Sig Start Date End Date Taking? Authorizing Provider  ?Cholecalciferol (VITAMIN D) 50 MCG (2000 UT) tablet Take 2,000 Units by mouth daily.   Yes [provider]  ?enoxaparin (LOVENOX) 150 MG/ML injection INJECT 1 ML (150 MG) INTO THE SKIN DAILY AS DIRECTED BY ANTICOAGULATION CLINIC 05/01/21  Yes CrHoyt KochMD  ?enoxaparin (LOVENOX) 30 MG/0.3ML injection INJECT 0.3 ML (30 MG) INTO THE SKIN ONCE DAILY AS DIRECTED BY ANTICOAGULATION CLINIC 05/01/21  Yes CrHoyt KochMD   ?esomeprazole (NEXIUM) 20 MG capsule Take 20 mg by mouth daily at 12 noon.    Yes [provider]  ?triamcinolone cream (KENALOG) 0.1 % Apply 1 application topically 2 (two) times daily. 05/30/19  Yes CrHoyt KochMD  ?albuterol ([facility]) 108 (90 Base) MCG/ACT inhaler Inhale 2 puffs into the lungs every 4 (four) hours as needed. 07/14/19   CrHoyt KochMD  ?warfarin (COUMADIN) 5 MG tablet TAKE 1 TABLET DAILY EXCEPT TAKE 2 TABLETS ON MONDAYS, [facility] UniversityS DIRECTED BY CLINIC 04/01/21   CrHoyt KochMD  ? ? ?Current Outpatient Medications  ?Medication Sig Dispense Refill  ? Cholecalciferol (VITAMIN D) 50 MCG (2000 UT) tablet Take 2,000 Units by mouth daily.    ? enoxaparin (LOVENOX) 150 MG/ML injection INJECT 1 ML (150 MG) INTO THE SKIN DAILY AS DIRECTED BY ANTICOAGULATION CLINIC 7 mL 0  ? enoxaparin (LOVENOX) 30 MG/0.3ML injection INJECT 0.3 ML (30 MG) INTO THE SKIN ONCE DAILY AS DIRECTED BY ANTICOAGULATION CLINIC 2.1 mL 0  ? esomeprazole (NEXIUM) 20 MG capsule Take 20 mg by mouth daily at 12 noon.     ? triamcinolone cream (KENALOG) 0.1 % Apply 1 application topically 2 (two) times daily. 80 g 3  ? albuterol (PROAIR HFA) 108 (90 Base) MCG/ACT inhaler Inhale 2 puffs into the lungs every 4 (four) hours as needed. 18 g  3  ? warfarin (COUMADIN) 5 MG tablet TAKE 1 TABLET DAILY EXCEPT TAKE 2 TABLETS ON MONDAYS, WEDNESDAYS & FRIDAYS OR AS DIRECTED BY CLINIC 45 tablet 1  ? ?Current Facility-Administered Medications  ?Medication Dose Route Frequency Provider Last Rate Last Admin  ? 0.9 %  sodium chloride infusion  500 mL Intravenous Continuous Aleera Gilcrease, Carlota Raspberry, MD      ? ? ?Allergies as of 05/13/2021 - Review Complete 05/13/2021  ?Allergen Reaction Noted  ? Codeine Nausea And Vomiting 11/28/2015  ? Other Hives and Other (See Comments) 12/03/2018  ? ? ?Family History  ?Problem Relation Age of Onset  ? Hypertension Mother   ? Thyroid disease Mother   ?     parathyroid cyst  removed  ? Dementia Father   ? Hypertension Father   ? Endocrine tumor Sister   ? Leukemia Sister   ? Ovarian cancer Maternal Grandmother   ? Arthritis Other   ?     Parent  ? Uterine cancer Other   ?     grandparent  ? Breast cancer Neg Hx   ? Colon cancer Neg Hx   ? Rectal cancer Neg Hx   ? Stomach cancer Neg Hx   ? ? ?Social History  ? ?Socioeconomic History  ? Marital status: Single  ?  Spouse name: Not on file  ? Number of children: 0  ? Years of education: Not on file  ? Highest education level: Not on file  ?Occupational History  ? Not on file  ?Tobacco Use  ? Smoking status: Former  ?  Packs/day: 1.00  ?  Years: 30.00  ?  Pack years: 30.00  ?  Types: Cigarettes  ?  Quit date: 04/13/2008  ?  Years since quitting: 13.0  ? Smokeless tobacco: Never  ? Tobacco comments:  ?  single, lives alone 1 dog and 3 cats. 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 Vaping Use: Never used  ?Substance and Sexual Activity  ? Alcohol use: Yes  ?  Alcohol/week: 0.0 standard drinks  ?  Comment: [place]  ? Drug use: No  ? Sexual activity: Not Currently  ?  Partners: Male  ?  Birth control/protection: Post-menopausal  ?Other Topics Concern  ? Not on file  ?Social History Narrative  ? Not on file  ? ?Social Determinants of Health  ? ?Financial Resource Strain: Not on file  ?Food Insecurity: Not on file  ?Transportation Needs: Not on file  ?Physical Activity: Not on file  ?Stress: Not on file  ?Social Connections: Not on file  ?Intimate Partner Violence: Not on file  ? ? ?Review of Systems: ?All other review of systems negative except as mentioned in the HPI. ? ?Physical Exam: ?Vital signs ?BP 129/88   Pulse 79   Temp (!) 97.5 ?F (36.4 ?C)   Resp 13   Ht '5\' 8"'$  (1.727 m)   Wt 261 lb (118.4 kg)   LMP 05/19/2015   SpO2 96%   BMI 39.68 kg/m?  ? ?General:   Alert,  Well-developed, pleasant and cooperative in NAD ?Lungs:  Clear throughout to auscultation.   ?Heart:  Regular rate and rhythm ?Abdomen:  Soft,  nontender and nondistended.   ?Neuro/Psych:  Alert and cooperative. Normal mood and affect. A and O x 3 ? ?Jolly Mango, MD ?[facility] ? ? ?

## 2021-05-13 NOTE — Progress Notes (Signed)
Report given to PACU, vss 

## 2021-05-13 NOTE — Patient Instructions (Signed)
Handouts given on hemorrhoids, diverticulosis, and polyps. ?Resume previous diet and continue present medications. ?Repeat colonoscopy for surveillance will be determined based off of pathology results. ?Resume Coumadin tonight and resume Lovenox tomorrow morning  ? ?YOU HAD AN ENDOSCOPIC PROCEDURE TODAY AT [facility]:   Refer to the procedure report that was given to you for any specific questions about what was found during the examination.  If the procedure report does not answer your questions, please call your gastroenterologist to clarify.  If you requested that your care partner not be given the details of your procedure findings, then the procedure report has been included in a sealed envelope for you to review at your convenience later. ? ?YOU SHOULD EXPECT: Some feelings of bloating in the abdomen. Passage of more gas than usual.  Walking can help get rid of the air that was put into your GI tract during the procedure and reduce the bloating. If you had a lower endoscopy (such as a colonoscopy or flexible sigmoidoscopy) you may notice spotting of blood in your stool or on the toilet paper. If you underwent a bowel prep for your procedure, you may not have a normal bowel movement for a few days. ? ?Please Note:  You might notice some irritation and congestion in your nose or some drainage.  This is from the oxygen used during your procedure.  There is no need for concern and it should clear up in a day or so. ? ?SYMPTOMS TO REPORT IMMEDIATELY: ? ?Following lower endoscopy (colonoscopy or flexible sigmoidoscopy): ? Excessive amounts of blood in the stool ? Significant tenderness or worsening of abdominal pains ? Swelling of the abdomen that is new, acute ? Fever of 100?F or higher ? ?For urgent or emergent issues, a gastroenterologist can be reached at any hour by calling [phone]. ?Do not use MyChart messaging for urgent concerns.  ? ? ?DIET:  We do recommend a small meal at first,  but then you may proceed to your regular diet.  Drink plenty of fluids but you should avoid alcoholic beverages for 24 hours. ? ?ACTIVITY:  You should plan to take it easy for the rest of today and you should NOT DRIVE or use heavy machinery until tomorrow (because of the sedation medicines used during the test).   ? ?FOLLOW UP: ?Our staff will call the number listed on your records 48-72 hours following your procedure to check on you and address any questions or concerns that you may have regarding the information given to you following your procedure. If we do not reach you, we will leave a message.  We will attempt to reach you two times.  During this call, we will ask if you have developed any symptoms of COVID 19. If you develop any symptoms (ie: fever, flu-like symptoms, shortness of breath, cough etc.) before then, please call [phone].  If you test positive for Covid 19 in the 2 weeks post procedure, please call and report this information to [place].   ? ?If any biopsies were taken you will be contacted by phone or by letter within the next 1-3 weeks.  Please call us at [phone] if you have not heard about the biopsies in 3 weeks.  ? ? ?SIGNATURES/CONFIDENTIALITY: ?You and/or your care partner have signed paperwork which will be entered into your electronic medical record.  These signatures attest to the fact that that the information above on your After Visit Summary has been reviewed and is understood.  Full responsibility of the confidentiality of this discharge information lies with you and/or your care-partner.  ?

## 2021-05-13 NOTE — Op Note (Signed)
[place] ?Patient Name: Megan Parrish ?Procedure Date: 05/13/2021 7:14 AM ?MRN: [id number] ?Endoscopist: Carlota Raspberry. Havery Moros , MD ?Age: 66 ?Referring MD:  ?Date of Birth: [birth year] ?Gender: Female ?Account #: [id number] ?Procedure:                Colonoscopy ?Indications:              Positive Cologuard test - first colonoscopy,  ?                          history of chronic coumadin use ?Medicines:                Monitored Anesthesia Care ?Procedure:                Pre-Anesthesia Assessment: ?                          - Prior to the procedure, a History and Physical  ?                          was performed, and patient medications and  ?                          allergies were reviewed. The patient's tolerance of  ?                          previous anesthesia was also reviewed. The risks  ?                          and benefits of the procedure and the sedation  ?                          options and risks were discussed with the patient.  ?                          All questions were answered, and informed consent  ?                          was obtained. Prior Anticoagulants: The patient has  ?                          taken Coumadin (warfarin), last dose was 5 days  ?                          prior to procedure, Lovenox taken day prior to  ?                          procedure. ASA Grade Assessment: II - A patient  ?                          with mild systemic disease. After reviewing the  ?                          risks and benefits, the patient was deemed in  ?  satisfactory condition to undergo the procedure. ?                          After obtaining informed consent, the colonoscope  ?                          was passed under direct vision. Throughout the  ?                          procedure, the patient's blood pressure, pulse, and  ?                          oxygen saturations were monitored continuously. The  ?                          Olympus PCF-H190DL  ([id number]) Colonoscope was  ?                          introduced through the anus and advanced to the the  ?                          cecum, identified by appendiceal orifice and  ?                          ileocecal valve. The colonoscopy was performed  ?                          without difficulty. The patient tolerated the  ?                          procedure well. The quality of the bowel  ?                          preparation was good. The ileocecal valve,  ?                          appendiceal orifice, and rectum were photographed. ?Scope In: 8:07:32 AM ?Scope Out: 8:29:29 AM ?Scope Withdrawal Time: 0 hours 17 minutes 33 seconds  ?Total Procedure Duration: 0 hours 21 minutes 57 seconds  ?Findings:                 The perianal and digital rectal examinations were  ?                          normal. ?                          A 4 mm polyp was found in the transverse colon. The  ?                          polyp was flat. The polyp was removed with a cold  ?                          snare. Resection and retrieval were complete. ?  Three sessile polyps were found in the sigmoid  ?                          colon. The polyps were 3 to 5 mm in size. These  ?                          polyps were removed with a cold snare. Resection  ?                          and retrieval were complete. ?                          Multiple medium-mouthed diverticula were found in  ?                          the entire colon. One diverticulum in the proximal  ?                          transverse colon had a small area of nodular mucosa  ?                          surrounding it, I suspect inflammatory in etiology,  ?                          but biopsies were taken with a cold forceps for  ?                          histology. ?                          Internal hemorrhoids were found during  ?                          retroflexion. The hemorrhoids were small. ?                          There were multiple  diminutive hyperplastic polyps  ?                          in the rectum in rectosigmoid colon. The exam was  ?                          otherwise without abnormality. ?Complications:            No immediate complications. Estimated blood loss:  ?                          Minimal. ?Estimated Blood Loss:     Estimated blood loss was minimal. ?Impression:               - One 4 mm polyp in the transverse colon, removed  ?                          with a cold snare. Resected and retrieved. ?                          -  Three 3 to 5 mm polyps in the sigmoid colon,  ?                          removed with a cold snare. Resected and retrieved. ?                          - Diverticulosis in the entire examined colon. One  ?                          with altered mucosa as outlined above, biopsied. ?                          - Internal hemorrhoids. ?                          - Dimunitive hyperplastic polyps of the left colon. ?                          - The examination was otherwise normal. ?Recommendation:           - Patient has a contact number available for  ?                          emergencies. The signs and symptoms of potential  ?                          delayed complications were discussed with the  ?                          patient. Return to normal activities tomorrow.  ?                          Written discharge instructions were provided to the  ?                          patient. ?                          - Resume previous diet. ?                          - Continue present medications. ?                          - Resume Coumadin tonight ?                          - Resume lovenox tomorrow AM - duration per  ?                          coumadin clinic / prescribing physician ?                          - Await pathology results. ?Carlota Raspberry. Audryana Hockenberry, MD ?05/13/2021 8:36:54 AM ?This report has been signed electronically. ?

## 2021-05-15 ENCOUNTER — Telehealth: Payer: Self-pay

## 2021-05-15 ENCOUNTER — Telehealth: Payer: Self-pay | Admitting: *Deleted

## 2021-05-15 NOTE — Telephone Encounter (Signed)
?  Follow up Call- ? ? ?  05/13/2021  ?  7:15 AM  ?Call back number  ?Post procedure Call Back phone  # [phone]  ?Permission to leave phone message Yes  ?  ? ?Patient questions: ? ?Do you have a fever, pain , or abdominal swelling? No. ?Pain Score  0 * ? ?Have you tolerated food without any problems? Yes.   ? ?Have you been able to return to your normal activities? Yes.   ? ?Do you have any questions about your discharge instructions: ?Diet   No. ?Medications  No. ?Follow up visit  No. ? ?Do you have questions or concerns about your Care? No. ? ?Actions: ?* If pain score is 4 or above: ?No action needed, pain <4. ? ? ?

## 2021-05-15 NOTE — Telephone Encounter (Signed)
Attempted to call patient for their post-procedure follow-up call. No answer. Left voicemail.   

## 2021-05-21 ENCOUNTER — Ambulatory Visit (INDEPENDENT_AMBULATORY_CARE_PROVIDER_SITE_OTHER): Payer: No Typology Code available for payment source

## 2021-05-21 DIAGNOSIS — Z7901 Long term (current) use of anticoagulants: Secondary | ICD-10-CM | POA: Diagnosis not present

## 2021-05-21 LAB — POCT INR: INR: 1.6 — AB (ref 2.0–3.0)

## 2021-05-21 NOTE — Progress Notes (Signed)
Pt has been on lovenox bridge for colonoscopy on 4/17. Increase dose today to take 1 1/2  tablets and increase dose tomorrow to take 3 tablets and then continue 2 tablets daily except 1 tablet on Sun, Tues and Thurs and Saturdays. Re-check in 4 weeks. ?

## 2021-05-21 NOTE — Patient Instructions (Addendum)
Pre visit review using our clinic review tool, if applicable. No additional management support is needed unless otherwise documented below in the visit note. ? ?Increase dose today to take 1 1/2  tablets and increase dose tomorrow to take 3 tablets and then continue 2 tablets daily except 1 tablet on Sun, Tues and Thurs and Saturdays. Re-check in 4 weeks. ?

## 2021-06-18 ENCOUNTER — Ambulatory Visit (INDEPENDENT_AMBULATORY_CARE_PROVIDER_SITE_OTHER): Payer: No Typology Code available for payment source

## 2021-06-18 DIAGNOSIS — Z7901 Long term (current) use of anticoagulants: Secondary | ICD-10-CM

## 2021-06-18 LAB — POCT INR: INR: 2.3 (ref 2.0–3.0)

## 2021-06-18 NOTE — Patient Instructions (Addendum)
Pre visit review using our clinic review tool, if applicable. No additional management support is needed unless otherwise documented below in the visit note.  Continue 2 tablets daily except 1 tablet on Sun, Tues and Thurs and Saturdays. Re-check in 6 weeks. 

## 2021-06-18 NOTE — Progress Notes (Signed)
Continue 2 tablets daily except 1 tablet on Sun, Tues and Thurs and Saturdays. Re-check in 6 weeks. 

## 2021-06-19 ENCOUNTER — Other Ambulatory Visit: Payer: Self-pay | Admitting: Internal Medicine

## 2021-08-02 ENCOUNTER — Ambulatory Visit (INDEPENDENT_AMBULATORY_CARE_PROVIDER_SITE_OTHER): Payer: No Typology Code available for payment source

## 2021-08-02 DIAGNOSIS — Z7901 Long term (current) use of anticoagulants: Secondary | ICD-10-CM

## 2021-08-02 LAB — POCT INR: INR: 2.2 (ref 2.0–3.0)

## 2021-08-02 NOTE — Patient Instructions (Addendum)
Pre visit review using our clinic review tool, if applicable. No additional management support is needed unless otherwise documented below in the visit note.  Continue 2 tablets daily except 1 tablet on Sun, Tues and Thurs and Saturdays. Re-check in 6 weeks.

## 2021-08-02 NOTE — Progress Notes (Signed)
Continue 2 tablets daily except 1 tablet on Sun, Tues and Thurs and Saturdays. Re-check in 6 weeks.

## 2021-09-13 ENCOUNTER — Ambulatory Visit (INDEPENDENT_AMBULATORY_CARE_PROVIDER_SITE_OTHER): Payer: No Typology Code available for payment source

## 2021-09-13 DIAGNOSIS — Z7901 Long term (current) use of anticoagulants: Secondary | ICD-10-CM | POA: Diagnosis not present

## 2021-09-13 LAB — POCT INR: INR: 2.1 (ref 2.0–3.0)

## 2021-09-13 NOTE — Progress Notes (Signed)
Continue 2 tablets daily except 1 tablet on Sun, Tues and Thurs and Saturdays. Re-check in 6 weeks.

## 2021-09-13 NOTE — Patient Instructions (Addendum)
Pre visit review using our clinic review tool, if applicable. No additional management support is needed unless otherwise documented below in the visit note.  Continue 2 tablets daily except 1 tablet on Sun, Tues and Thurs and Saturdays. Re-check in 6 weeks.

## 2021-09-16 ENCOUNTER — Other Ambulatory Visit: Payer: Self-pay | Admitting: Internal Medicine

## 2021-09-20 ENCOUNTER — Telehealth: Payer: Self-pay | Admitting: Internal Medicine

## 2021-09-20 DIAGNOSIS — Z7901 Long term (current) use of anticoagulants: Secondary | ICD-10-CM

## 2021-09-20 NOTE — Telephone Encounter (Signed)
Caller & Relationship to patient: Twala- self  Call back number: [phone]  Date of last office visit: CPE 01-11-21 Coumadin Check 09-13-21  Date of next office visit: Coumadin check 9/29 CPE 01-31-22  Medication(s) to be refilled: albuterol (PROAIR HFA) 108 (90 Base) MCG/ACT inhaler   triamcinolone cream (KENALOG) 0.1 %   warfarin (COUMADIN) 5 MG tablet  Preferred Pharmacy: [id number]- [place], [place] - [id number]

## 2021-09-24 ENCOUNTER — Other Ambulatory Visit: Payer: Self-pay

## 2021-09-24 MED ORDER — WARFARIN SODIUM 5 MG PO TABS: ORAL_TABLET | ORAL | 1 refills | Status: DC

## 2021-09-24 MED ORDER — ALBUTEROL SULFATE HFA 108 (90 BASE) MCG/ACT IN AERS: 2.0000 | INHALATION_SPRAY | RESPIRATORY_TRACT | 3 refills | Status: AC | PRN

## 2021-09-24 MED ORDER — TRIAMCINOLONE ACETONIDE 0.1 % EX CREA: 1.0000 | TOPICAL_CREAM | Freq: Two times a day (BID) | CUTANEOUS | 3 refills | Status: DC

## 2021-09-24 NOTE — Telephone Encounter (Signed)
Pt is compliant with warfarin management and PCP apts. Sent in refill of warfarin.

## 2021-10-15 ENCOUNTER — Telehealth: Payer: Self-pay

## 2021-10-15 NOTE — Telephone Encounter (Signed)
Pt has coumadin clinic apt scheduled for 9/29 but schedule must be closed that day. LVM for pt to RS apt to either 9/26 or 10/6.

## 2021-10-25 ENCOUNTER — Ambulatory Visit: Payer: No Typology Code available for payment source

## 2021-11-01 ENCOUNTER — Ambulatory Visit (INDEPENDENT_AMBULATORY_CARE_PROVIDER_SITE_OTHER): Payer: No Typology Code available for payment source

## 2021-11-01 DIAGNOSIS — Z7901 Long term (current) use of anticoagulants: Secondary | ICD-10-CM

## 2021-11-01 LAB — POCT INR: INR: 2 (ref 2.0–3.0)

## 2021-11-01 NOTE — Progress Notes (Signed)
Continue 1 tablet daily EXCEPT take 2 tablets on Mondays, Wednesdays, and Fridays.   Pt inquired about home INR testing. Pt states she will call her insurance provider to get details about coverage then report back to anticoagulation clinic at date of next INR check.

## 2021-11-01 NOTE — Patient Instructions (Signed)
Continue 1 tablet daily EXCEPT take 2 tablets on Mondays, Wednesdays, and Fridays.

## 2021-12-13 ENCOUNTER — Ambulatory Visit (INDEPENDENT_AMBULATORY_CARE_PROVIDER_SITE_OTHER): Payer: No Typology Code available for payment source

## 2021-12-13 DIAGNOSIS — Z7901 Long term (current) use of anticoagulants: Secondary | ICD-10-CM

## 2021-12-13 LAB — POCT INR: INR: 1.8 — AB (ref 2.0–3.0)

## 2021-12-13 NOTE — Progress Notes (Signed)
Increase dose today to 3 tablets (15 mg). Then continue 1 tablet daily EXCEPT take 2 tablets on Mondays, Wednesdays, and Fridays. Recheck in 3 weeks.

## 2021-12-13 NOTE — Patient Instructions (Signed)
Increase dose today to 3 tablets (15 mg). Then continue 1 tablet daily EXCEPT take 2 tablets on Mondays, Wednesdays, and Fridays. Recheck on 01/03/22 at 11:30.

## 2022-01-03 ENCOUNTER — Ambulatory Visit (INDEPENDENT_AMBULATORY_CARE_PROVIDER_SITE_OTHER): Payer: No Typology Code available for payment source

## 2022-01-03 DIAGNOSIS — Z7901 Long term (current) use of anticoagulants: Secondary | ICD-10-CM | POA: Diagnosis not present

## 2022-01-03 LAB — POCT INR: INR: 2.2 (ref 2.0–3.0)

## 2022-01-03 NOTE — Patient Instructions (Addendum)
Pre visit review using our clinic review tool, if applicable. No additional management support is needed unless otherwise documented below in the visit note.  Continue 1 tablet daily EXCEPT take 2 tablets on Mondays, Wednesdays, and Fridays. Recheck in 6 weeks.

## 2022-01-03 NOTE — Progress Notes (Signed)
Continue 1 tablet daily EXCEPT take 2 tablets on Mondays, Wednesdays, and Fridays. Recheck in 6 weeks.

## 2022-01-28 ENCOUNTER — Telehealth: Payer: Self-pay

## 2022-01-28 NOTE — Telephone Encounter (Signed)
Pt called requesting to move anticoagulation clinic apt to 1/5 from 1/19 because she also has a PCP apt that day. RS apt to 1/5.

## 2022-01-31 ENCOUNTER — Encounter: Payer: Self-pay | Admitting: Internal Medicine

## 2022-01-31 ENCOUNTER — Ambulatory Visit: Payer: No Typology Code available for payment source

## 2022-01-31 ENCOUNTER — Ambulatory Visit (INDEPENDENT_AMBULATORY_CARE_PROVIDER_SITE_OTHER): Payer: No Typology Code available for payment source | Admitting: Internal Medicine

## 2022-01-31 VITALS — BP 120/86 | HR 85 | Temp 97.8°F | Ht 68.0 in | Wt 259.0 lb

## 2022-01-31 DIAGNOSIS — E785 Hyperlipidemia, unspecified: Secondary | ICD-10-CM | POA: Diagnosis not present

## 2022-01-31 DIAGNOSIS — Z0000 Encounter for general adult medical examination without abnormal findings: Secondary | ICD-10-CM

## 2022-01-31 DIAGNOSIS — I82409 Acute embolism and thrombosis of unspecified deep veins of unspecified lower extremity: Secondary | ICD-10-CM

## 2022-01-31 DIAGNOSIS — R7303 Prediabetes: Secondary | ICD-10-CM | POA: Diagnosis not present

## 2022-01-31 DIAGNOSIS — K219 Gastro-esophageal reflux disease without esophagitis: Secondary | ICD-10-CM

## 2022-01-31 DIAGNOSIS — J4520 Mild intermittent asthma, uncomplicated: Secondary | ICD-10-CM

## 2022-01-31 DIAGNOSIS — Z7901 Long term (current) use of anticoagulants: Secondary | ICD-10-CM | POA: Diagnosis not present

## 2022-01-31 DIAGNOSIS — E2839 Other primary ovarian failure: Secondary | ICD-10-CM

## 2022-01-31 LAB — CBC
HCT: 45.6 % (ref 36.0–46.0)
Hemoglobin: 15.2 g/dL — ABNORMAL HIGH (ref 12.0–15.0)
MCHC: 33.2 g/dL (ref 30.0–36.0)
MCV: 90 fl (ref 78.0–100.0)
Platelets: 205 10*3/uL (ref 150.0–400.0)
RBC: 5.07 Mil/uL (ref 3.87–5.11)
RDW: 14 % (ref 11.5–15.5)
WBC: 6.1 10*3/uL (ref 4.0–10.5)

## 2022-01-31 LAB — COMPREHENSIVE METABOLIC PANEL
ALT: 32 U/L (ref 0–35)
AST: 24 U/L (ref 0–37)
Albumin: 4.5 g/dL (ref 3.5–5.2)
Alkaline Phosphatase: 98 U/L (ref 39–117)
BUN: 18 mg/dL (ref 6–23)
CO2: 32 mEq/L (ref 19–32)
Calcium: 9.9 mg/dL (ref 8.4–10.5)
Chloride: 101 mEq/L (ref 96–112)
Creatinine, Ser: 0.93 mg/dL (ref 0.40–1.20)
GFR: 64.52 mL/min (ref >60.00)
Glucose, Bld: 107 mg/dL — ABNORMAL HIGH (ref 70–99)
Potassium: 4.2 mEq/L (ref 3.5–5.1)
Sodium: 141 mEq/L (ref 135–145)
Total Bilirubin: 1.2 mg/dL (ref 0.2–1.2)
Total Protein: 7 g/dL (ref 6.0–8.3)

## 2022-01-31 LAB — POCT INR: INR: 2.5 (ref 2.0–3.0)

## 2022-01-31 LAB — LIPID PANEL
Cholesterol: 271 mg/dL — ABNORMAL HIGH (ref 0–200)
HDL: 58.1 mg/dL (ref >39.00)
LDL Cholesterol: 190 mg/dL — ABNORMAL HIGH (ref 0–99)
NonHDL: 213.31
Total CHOL/HDL Ratio: 5
Triglycerides: 118 mg/dL (ref 0.0–149.0)
VLDL: 23.6 mg/dL (ref 0.0–40.0)

## 2022-01-31 LAB — HEMOGLOBIN A1C: Hgb A1c MFr Bld: 6.4 % (ref 4.6–6.5)

## 2022-01-31 MED ORDER — WARFARIN SODIUM 5 MG PO TABS: ORAL_TABLET | ORAL | 3 refills | Status: DC

## 2022-01-31 NOTE — Assessment & Plan Note (Signed)
On warfarin and sees coumadin clinic monthly. Does lovenox bridge with surgeries as needed.

## 2022-01-31 NOTE — Assessment & Plan Note (Signed)
Checking HgA1c and adjust as needed.  

## 2022-01-31 NOTE — Assessment & Plan Note (Signed)
Checking lipid panel and adjust as needed. Not currently on statin due to patient preference.

## 2022-01-31 NOTE — Progress Notes (Signed)
   Subjective:   Patient ID: Megan Parrish, female    DOB: [birth year], 67 y.o.   MRN: [id number]  HPI The patient is here for physical.  PMH, [facility], social history reviewed and updated  Review of Systems  Constitutional: Negative.   HENT: Negative.    Eyes: Negative.   Respiratory:  Negative for cough, chest tightness and shortness of breath.   Cardiovascular:  Negative for chest pain, palpitations and leg swelling.  Gastrointestinal:  Negative for abdominal distention, abdominal pain, constipation, diarrhea, nausea and vomiting.  Musculoskeletal:  Positive for arthralgias.  Skin: Negative.   Neurological: Negative.   Psychiatric/Behavioral: Negative.      Objective:  Physical Exam Constitutional:      Appearance: She is well-developed. She is obese.  HENT:     Head: Normocephalic and atraumatic.  Cardiovascular:     Rate and Rhythm: Normal rate and regular rhythm.  Pulmonary:     Effort: Pulmonary effort is normal. No respiratory distress.     Breath sounds: Normal breath sounds. No wheezing or rales.  Abdominal:     General: Bowel sounds are normal. There is no distension.     Palpations: Abdomen is soft.     Tenderness: There is no abdominal tenderness. There is no rebound.  Musculoskeletal:     Cervical back: Normal range of motion.  Skin:    General: Skin is warm and dry.  Neurological:     Mental Status: She is alert and oriented to person, place, and time.     Coordination: Coordination normal.     Vitals:   01/31/22 0809  BP: 120/86  Pulse: 85  Temp: 97.8 F (36.6 C)  TempSrc: Oral  SpO2: 95%  Weight: 259 lb (117.5 kg)  Height: '5\' 8"'$  (1.727 m)    Assessment & Plan:

## 2022-01-31 NOTE — Assessment & Plan Note (Signed)
Taking nexium daily and well controlled. Will continue.

## 2022-01-31 NOTE — Assessment & Plan Note (Signed)
Still non-smoker. Counseled she qualifies for lung cancer screening and she will think about it. Uses albuterol prn and no refill needed today.

## 2022-01-31 NOTE — Assessment & Plan Note (Signed)
Flu shot declines. Pneumonia declines. Shingrix declines. Tetanus up to date. Colonoscopy up to date. Mammogram up to date, pap smear aged out and dexa ordered. Counseled about sun safety and mole surveillance. Counseled about the dangers of distracted driving. Given 10 year screening recommendations.

## 2022-01-31 NOTE — Patient Instructions (Addendum)
Pre visit review using our clinic review tool, if applicable. No additional management support is needed unless otherwise documented below in the visit note.  Continue 1 tablet daily EXCEPT take 2 tablets on Mondays, Wednesdays, and Fridays. Recheck in 6 weeks.

## 2022-01-31 NOTE — Progress Notes (Signed)
Continue 1 tablet daily EXCEPT take 2 tablets on Mondays, Wednesdays, and Fridays. Recheck in 6 weeks.  Pt needed warfarin prescription. Sent in refill.  Medical treatment/procedure(s) were performed by non-physician practitioner and as supervising physician I was immediately available for consultation/collaboration.  I agree with above. Hoyt Koch, MD

## 2022-01-31 NOTE — Assessment & Plan Note (Signed)
BMI 39 and complicated by hyperlipidemia and GERD. Counseled about weight loss and exercise.

## 2022-02-14 ENCOUNTER — Ambulatory Visit: Payer: No Typology Code available for payment source

## 2022-02-17 ENCOUNTER — Ambulatory Visit
Admission: RE | Admit: 2022-02-17 | Discharge: 2022-02-17 | Disposition: A | Discharge status: Home / Self Care | Payer: No Typology Code available for payment source | Source: Ambulatory Visit | Attending: Internal Medicine | Admitting: Internal Medicine

## 2022-02-17 ENCOUNTER — Inpatient Hospital Stay: Admission: RE | Admit: 2022-02-17 | Payer: No Typology Code available for payment source | Source: Ambulatory Visit

## 2022-02-17 DIAGNOSIS — E2839 Other primary ovarian failure: Secondary | ICD-10-CM

## 2022-02-21 ENCOUNTER — Encounter: Payer: Self-pay | Admitting: Internal Medicine

## 2022-02-21 LAB — HM DEXA SCAN: HM Dexa Scan: 1

## 2022-03-14 ENCOUNTER — Ambulatory Visit (INDEPENDENT_AMBULATORY_CARE_PROVIDER_SITE_OTHER): Payer: No Typology Code available for payment source

## 2022-03-14 DIAGNOSIS — Z7901 Long term (current) use of anticoagulants: Secondary | ICD-10-CM

## 2022-03-14 LAB — POCT INR: INR: 2 (ref 2.0–3.0)

## 2022-03-14 NOTE — Patient Instructions (Addendum)
Pre visit review using our clinic review tool, if applicable. No additional management support is needed unless otherwise documented below in the visit note.  Continue 1 tablet daily EXCEPT take 2 tablets on Mondays, Wednesdays, and Fridays. Recheck in 7 weeks.

## 2022-03-14 NOTE — Progress Notes (Addendum)
Continue 1 tablet daily EXCEPT take 2 tablets on Mondays, Wednesdays, and Fridays. Recheck in 6 weeks per pt request.

## 2022-05-02 ENCOUNTER — Ambulatory Visit (INDEPENDENT_AMBULATORY_CARE_PROVIDER_SITE_OTHER): Payer: No Typology Code available for payment source

## 2022-05-02 DIAGNOSIS — Z7901 Long term (current) use of anticoagulants: Secondary | ICD-10-CM | POA: Diagnosis not present

## 2022-05-02 LAB — POCT INR: INR: 1.8 — AB (ref 2.0–3.0)

## 2022-05-02 NOTE — Progress Notes (Signed)
Increase dose today to take 3 tablets and then continue 1 tablet daily except take 2 tablets on Mondays, Wednesdays, and Fridays. Recheck in 4 weeks.

## 2022-05-02 NOTE — Patient Instructions (Addendum)
Pre visit review using our clinic review tool, if applicable. No additional management support is needed unless otherwise documented below in the visit note.  Increase dose today to take 3 tablets and then continue 1 tablet daily except take 2 tablets on Mondays, Wednesdays, and Fridays. Recheck in 4 weeks.

## 2022-05-30 ENCOUNTER — Ambulatory Visit (INDEPENDENT_AMBULATORY_CARE_PROVIDER_SITE_OTHER): Payer: No Typology Code available for payment source

## 2022-05-30 DIAGNOSIS — Z7901 Long term (current) use of anticoagulants: Secondary | ICD-10-CM

## 2022-05-30 LAB — POCT INR: INR: 1.7 — AB (ref 2.0–3.0)

## 2022-05-30 NOTE — Progress Notes (Signed)
Increase dose today to take 3 tablets and then change weekly dose to take 2 tablet daily except take 1 tablets on Tuesdays, Thursdays and Saturdays. Recheck in 5 weeks per pt request.

## 2022-05-30 NOTE — Patient Instructions (Addendum)
Pre visit review using our clinic review tool, if applicable. No additional management support is needed unless otherwise documented below in the visit note.  Increase dose today to take 3 tablets and then change weekly dose to take 2 tablet daily except take 1 tablets on Tuesdays, Thursdays and Saturdays. Recheck in 5 weeks.

## 2022-07-02 ENCOUNTER — Ambulatory Visit: Payer: No Typology Code available for payment source

## 2022-07-02 DIAGNOSIS — Z7901 Long term (current) use of anticoagulants: Secondary | ICD-10-CM | POA: Diagnosis not present

## 2022-07-02 LAB — POCT INR: INR: 1.8 — AB (ref 2.0–3.0)

## 2022-07-02 NOTE — Progress Notes (Signed)
I have reviewed and agree with note, evaluation, plan.   Brinkley Peet, MD  

## 2022-07-02 NOTE — Patient Instructions (Addendum)
Pre visit review using our clinic review tool, if applicable. No additional management support is needed unless otherwise documented below in the visit note.  Increase dose today to take 3 tablets and then change weekly dose to take 2 tablet daily except take 1 tablet on Mondays and Thursdays. Recheck in 3 weeks.

## 2022-07-02 NOTE — Progress Notes (Addendum)
Unable to determine why pt has been subtherapeutic for the last 3 INR checks. Pt denies any changes but did report today that the manufacturer of her warfarin has changed a few months ago. This could be attributing to the low INR results, due to variations in manufacturers of generic medication. Increase dose today to take 3 tablets and then change weekly dose to take 2 tablet daily except take 1 tablet on Mondays and Thursdays. Recheck in 3 weeks.

## 2022-07-25 ENCOUNTER — Ambulatory Visit (INDEPENDENT_AMBULATORY_CARE_PROVIDER_SITE_OTHER): Payer: No Typology Code available for payment source

## 2022-07-25 DIAGNOSIS — Z7901 Long term (current) use of anticoagulants: Secondary | ICD-10-CM | POA: Diagnosis not present

## 2022-07-25 LAB — POCT INR: INR: 2.8 (ref 2.0–3.0)

## 2022-07-25 MED ORDER — WARFARIN SODIUM 5 MG PO TABS
ORAL_TABLET | ORAL | 3 refills | Status: DC
Start: 2022-07-25 — End: 2023-08-10

## 2022-07-25 NOTE — Progress Notes (Signed)
Continue 2 tablet daily except take 1 tablet on Mondays and Thursdays. Recheck in 6 weeks. Pt requested refill of warfarin because prescription sent in Jan does not match her current dosing. Sent in refill.

## 2022-07-25 NOTE — Patient Instructions (Addendum)
Pre visit review using our clinic review tool, if applicable. No additional management support is needed unless otherwise documented below in the visit note.  Continue 2 tablet daily except take 1 tablet on Mondays and Thursdays. Recheck in 6 weeks.

## 2022-09-05 ENCOUNTER — Ambulatory Visit (INDEPENDENT_AMBULATORY_CARE_PROVIDER_SITE_OTHER): Payer: No Typology Code available for payment source

## 2022-09-05 DIAGNOSIS — Z7901 Long term (current) use of anticoagulants: Secondary | ICD-10-CM

## 2022-09-05 LAB — POCT INR: INR: 2.8 (ref 2.0–3.0)

## 2022-09-05 NOTE — Patient Instructions (Addendum)
Pre visit review using our clinic review tool, if applicable. No additional management support is needed unless otherwise documented below in the visit note.  Continue 2 tablet daily except take 1 tablet on Mondays and Thursdays. Recheck in 8 weeks.

## 2022-09-05 NOTE — Progress Notes (Signed)
Continue 2 tablet daily except take 1 tablet on Mondays and Thursdays. Recheck in 8 weeks.

## 2022-10-31 ENCOUNTER — Ambulatory Visit (INDEPENDENT_AMBULATORY_CARE_PROVIDER_SITE_OTHER): Payer: No Typology Code available for payment source

## 2022-10-31 DIAGNOSIS — Z7901 Long term (current) use of anticoagulants: Secondary | ICD-10-CM

## 2022-10-31 LAB — POCT INR: INR: 3.4 — AB (ref 2.0–3.0)

## 2022-10-31 NOTE — Patient Instructions (Addendum)
Pre visit review using our clinic review tool, if applicable. No additional management support is needed unless otherwise documented below in the visit note.  Hold dose today and then change weekly dose to take 2 tablet daily except take 1 tablet on Mondays and Thursdays. Recheck in 4 weeks.

## 2022-10-31 NOTE — Progress Notes (Signed)
Pt reports a lot of stress in her life since moving but is working through it. Pt has also been eating cranberries about 1 week ago, and eating less greens. Pt reports she was placed on Augmentin for sinus infection on 8/30 for one week.  Hold dose today and then change weekly dose to take 2 tablet daily except take 1 tablet on Mondays and Thursdays. Recheck in 4 weeks.

## 2022-11-27 ENCOUNTER — Telehealth: Payer: Self-pay

## 2022-11-27 NOTE — Telephone Encounter (Signed)
Pt reports her boyfriend has been diagnosed with COVID and she would like to cancel her coumadin clinic apt for tomorrow. She has not tested positive but wants to be safe. RS apt for next Friday. Advised if she develops any symptoms to contact PCP office. Megan Parrish verbalized understanding.

## 2022-11-28 ENCOUNTER — Ambulatory Visit: Payer: No Typology Code available for payment source

## 2022-12-05 ENCOUNTER — Ambulatory Visit (INDEPENDENT_AMBULATORY_CARE_PROVIDER_SITE_OTHER): Payer: No Typology Code available for payment source

## 2022-12-05 DIAGNOSIS — Z7901 Long term (current) use of anticoagulants: Secondary | ICD-10-CM | POA: Diagnosis not present

## 2022-12-05 LAB — POCT INR: INR: 3.9 — AB (ref 2.0–3.0)

## 2022-12-05 NOTE — Progress Notes (Signed)
Pt reports she did not reduce her dose after the last visit to take 1 tablet 3 days/wk. She was confused and continued 1 tablet 2 days/wk. Denies any other changes. Hold dose today and then change weekly dose to take 1 tablet daily except take 2 tablets on Tuesdays, Thursdays and Saturdays. Recheck in 2 weeks.

## 2022-12-05 NOTE — Patient Instructions (Addendum)
Pre visit review using our clinic review tool, if applicable. No additional management support is needed unless otherwise documented below in the visit note.  Hold dose today and then change weekly dose to take 1 tablet daily except take 2 tablets on Tuesdays, Thursdays and Saturdays. Recheck in 2 weeks.

## 2022-12-19 ENCOUNTER — Ambulatory Visit (INDEPENDENT_AMBULATORY_CARE_PROVIDER_SITE_OTHER): Payer: No Typology Code available for payment source

## 2022-12-19 DIAGNOSIS — Z7901 Long term (current) use of anticoagulants: Secondary | ICD-10-CM | POA: Diagnosis not present

## 2022-12-19 LAB — POCT INR: INR: 2.4 (ref 2.0–3.0)

## 2022-12-19 NOTE — Patient Instructions (Addendum)
Pre visit review using our clinic review tool, if applicable. No additional management support is needed unless otherwise documented below in the visit note.  Continue 1 tablet daily except take 2 tablets on Tuesdays, Thursdays and Saturdays. Recheck in 6 weeks.

## 2022-12-19 NOTE — Progress Notes (Signed)
Continue 1 tablet daily except take 2 tablets on Tuesdays, Thursdays and Saturdays. Recheck in 6 weeks.

## 2023-01-30 ENCOUNTER — Ambulatory Visit (INDEPENDENT_AMBULATORY_CARE_PROVIDER_SITE_OTHER): Payer: No Typology Code available for payment source

## 2023-01-30 DIAGNOSIS — Z7901 Long term (current) use of anticoagulants: Secondary | ICD-10-CM | POA: Diagnosis not present

## 2023-01-30 LAB — POCT INR: INR: 2.2 (ref 2.0–3.0)

## 2023-01-30 NOTE — Progress Notes (Signed)
 Continue 1 tablet daily except take 2 tablets on Tuesdays, Thursdays and Saturdays. Recheck in 6 weeks.

## 2023-01-30 NOTE — Patient Instructions (Addendum)
 Pre visit review using our clinic review tool, if applicable. No additional management support is needed unless otherwise documented below in the visit note.  Continue 1 tablet daily except take 2 tablets on Tuesdays, Thursdays and Saturdays. Recheck in 6 weeks.

## 2023-02-14 ENCOUNTER — Other Ambulatory Visit: Payer: Self-pay | Admitting: Internal Medicine

## 2023-02-18 ENCOUNTER — Telehealth: Payer: Self-pay

## 2023-02-18 ENCOUNTER — Telehealth: Payer: Self-pay | Admitting: Internal Medicine

## 2023-02-18 NOTE — Telephone Encounter (Signed)
Copied from CRM [id number]. Topic: Clinical - Medication Question >> Feb 18, 2023  2:33 PM Gurney Maxin H wrote: Reason for CRM: Patients medication refill for triamcinolone cream (KENALOG) 0.1 % was denied due to patient needing an appointment. Patient has an appointment scheduled with provider and would like to know if she can have 1 refill of the triamcinolone cream (KENALOG) 0.1 % called in to the pharmacy on file to hold her until she comes in for appointment.  Harvi [phone]

## 2023-02-18 NOTE — Telephone Encounter (Signed)
Pt called to RS her annual physical with PCP and her coumadin clinic apt on the same day.   RS both apts for pt. Pt verbalized understanding.

## 2023-02-19 NOTE — Telephone Encounter (Signed)
Ok to refill 

## 2023-02-20 ENCOUNTER — Other Ambulatory Visit: Payer: Self-pay

## 2023-02-20 MED ORDER — TRIAMCINOLONE ACETONIDE 0.1 % EX CREA: 1.0000 | TOPICAL_CREAM | Freq: Two times a day (BID) | CUTANEOUS | 3 refills | Status: DC

## 2023-02-20 NOTE — Telephone Encounter (Signed)
Fine to refill

## 2023-02-20 NOTE — Telephone Encounter (Signed)
Done

## 2023-03-13 ENCOUNTER — Encounter: Payer: No Typology Code available for payment source | Admitting: Internal Medicine

## 2023-03-13 ENCOUNTER — Ambulatory Visit: Payer: No Typology Code available for payment source

## 2023-03-20 ENCOUNTER — Ambulatory Visit: Payer: No Typology Code available for payment source | Admitting: Internal Medicine

## 2023-03-20 ENCOUNTER — Ambulatory Visit: Payer: No Typology Code available for payment source

## 2023-03-20 ENCOUNTER — Encounter: Payer: Self-pay | Admitting: Internal Medicine

## 2023-03-20 VITALS — BP 118/80 | HR 112 | Temp 98.7°F | Ht 68.0 in | Wt 252.0 lb

## 2023-03-20 DIAGNOSIS — I82409 Acute embolism and thrombosis of unspecified deep veins of unspecified lower extremity: Secondary | ICD-10-CM | POA: Diagnosis not present

## 2023-03-20 DIAGNOSIS — Z0000 Encounter for general adult medical examination without abnormal findings: Secondary | ICD-10-CM | POA: Diagnosis not present

## 2023-03-20 DIAGNOSIS — J4520 Mild intermittent asthma, uncomplicated: Secondary | ICD-10-CM

## 2023-03-20 DIAGNOSIS — K219 Gastro-esophageal reflux disease without esophagitis: Secondary | ICD-10-CM

## 2023-03-20 DIAGNOSIS — R7303 Prediabetes: Secondary | ICD-10-CM

## 2023-03-20 DIAGNOSIS — E785 Hyperlipidemia, unspecified: Secondary | ICD-10-CM | POA: Diagnosis not present

## 2023-03-20 DIAGNOSIS — Z7901 Long term (current) use of anticoagulants: Secondary | ICD-10-CM | POA: Diagnosis not present

## 2023-03-20 DIAGNOSIS — Z6838 Body mass index (BMI) 38.0-38.9, adult: Secondary | ICD-10-CM

## 2023-03-20 DIAGNOSIS — E559 Vitamin D deficiency, unspecified: Secondary | ICD-10-CM | POA: Diagnosis not present

## 2023-03-20 LAB — COMPREHENSIVE METABOLIC PANEL
ALT: 16 U/L (ref 0–35)
AST: 16 U/L (ref 0–37)
Albumin: 4.3 g/dL (ref 3.5–5.2)
Alkaline Phosphatase: 91 U/L (ref 39–117)
BUN: 18 mg/dL (ref 6–23)
CO2: 30 meq/L (ref 19–32)
Calcium: 9.1 mg/dL (ref 8.4–10.5)
Chloride: 104 meq/L (ref 96–112)
Creatinine, Ser: 0.97 mg/dL (ref 0.40–1.20)
GFR: 60.86 mL/min (ref >60.00)
Glucose, Bld: 118 mg/dL — ABNORMAL HIGH (ref 70–99)
Potassium: 4.2 meq/L (ref 3.5–5.1)
Sodium: 142 meq/L (ref 135–145)
Total Bilirubin: 0.7 mg/dL (ref 0.2–1.2)
Total Protein: 7 g/dL (ref 6.0–8.3)

## 2023-03-20 LAB — HEMOGLOBIN A1C: Hgb A1c MFr Bld: 6.1 % (ref 4.6–6.5)

## 2023-03-20 LAB — LIPID PANEL
Cholesterol: 244 mg/dL — ABNORMAL HIGH (ref 0–200)
HDL: 63.5 mg/dL (ref >39.00)
LDL Cholesterol: 149 mg/dL — ABNORMAL HIGH (ref 0–99)
NonHDL: 180.91
Total CHOL/HDL Ratio: 4
Triglycerides: 158 mg/dL — ABNORMAL HIGH (ref 0.0–149.0)
VLDL: 31.6 mg/dL (ref 0.0–40.0)

## 2023-03-20 LAB — CBC
HCT: 43.4 % (ref 36.0–46.0)
Hemoglobin: 14.4 g/dL (ref 12.0–15.0)
MCHC: 33.3 g/dL (ref 30.0–36.0)
MCV: 92.2 fL (ref 78.0–100.0)
Platelets: 200 10*3/uL (ref 150.0–400.0)
RBC: 4.71 Mil/uL (ref 3.87–5.11)
RDW: 13.8 % (ref 11.5–15.5)
WBC: 8.9 10*3/uL (ref 4.0–10.5)

## 2023-03-20 LAB — POCT INR: INR: 1.9 — AB (ref 2.0–3.0)

## 2023-03-20 LAB — VITAMIN D 25 HYDROXY (VIT D DEFICIENCY, FRACTURES): VITD: 15.98 ng/mL — ABNORMAL LOW (ref 30.00–100.00)

## 2023-03-20 NOTE — Assessment & Plan Note (Addendum)
Taking coumadin lifelong and established and following regularly with coumadin clinic. Checking CBC no clinical signs of bleeding.

## 2023-03-20 NOTE — Assessment & Plan Note (Signed)
BMI 38 and complicated by hyperlipidemia and pre-diabetes. Counseled about diet and exercise.

## 2023-03-20 NOTE — Progress Notes (Signed)
Increase dose today to take 1 1/2 tablets and then continue 1 tablet daily except take 2 tablets on Tuesdays, Thursdays and Saturdays. Recheck in 4 weeks.

## 2023-03-20 NOTE — Assessment & Plan Note (Signed)
 Checking lipid panel and adjust as needed.

## 2023-03-20 NOTE — Assessment & Plan Note (Signed)
No flare today has albuterol to use prn.

## 2023-03-20 NOTE — Patient Instructions (Addendum)
Pre visit review using our clinic review tool, if applicable. No additional management support is needed unless otherwise documented below in the visit note.  Increase dose today to take 1 1/2 tablets and then continue 1 tablet daily except take 2 tablets on Tuesdays, Thursdays and Saturdays. Recheck in 4 weeks.

## 2023-03-20 NOTE — Assessment & Plan Note (Signed)
 Checking HGA1c and adjust as needed.

## 2023-03-20 NOTE — Progress Notes (Signed)
   Subjective:   Patient ID: Megan Parrish, female    DOB: Apr 13, 1955, 68 y.o.   MRN: [id number]  HPI The patient is here for physical.  PMH, [facility], social history reviewed and updated  Review of Systems  Constitutional: Negative.   HENT: Negative.    Eyes: Negative.   Respiratory:  Negative for cough, chest tightness and shortness of breath.   Cardiovascular:  Negative for chest pain, palpitations and leg swelling.  Gastrointestinal:  Negative for abdominal distention, abdominal pain, constipation, diarrhea, nausea and vomiting.  Musculoskeletal: Negative.   Skin: Negative.   Neurological: Negative.   Psychiatric/Behavioral: Negative.      Objective:  Physical Exam Constitutional:      Appearance: She is well-developed.  HENT:     Head: Normocephalic and atraumatic.  Cardiovascular:     Rate and Rhythm: Normal rate and regular rhythm.  Pulmonary:     Effort: Pulmonary effort is normal. No respiratory distress.     Breath sounds: Normal breath sounds. No wheezing or rales.  Abdominal:     General: Bowel sounds are normal. There is no distension.     Palpations: Abdomen is soft.     Tenderness: There is no abdominal tenderness. There is no rebound.  Musculoskeletal:     Cervical back: Normal range of motion.  Skin:    General: Skin is warm and dry.  Neurological:     Mental Status: She is alert and oriented to person, place, and time.     Coordination: Coordination normal.     Vitals:   03/20/23 1056  BP: 118/80  Pulse: (!) 112  Temp: 98.7 F (37.1 C)  TempSrc: Oral  SpO2: 96%  Weight: 252 lb (114.3 kg)  Height: 5\' 8"  (1.727 m)    Assessment & Plan:

## 2023-03-20 NOTE — Assessment & Plan Note (Signed)
Flu shot declines. Pneumonia declines. Shingrix declines. Tetanus up to date. Colonoscopy up to date. Mammogram due counseled to schedule, pap smear aged out and dexa complete. Counseled about sun safety and mole surveillance. Counseled about the dangers of distracted driving. Given 10 year screening recommendations.

## 2023-03-20 NOTE — Assessment & Plan Note (Signed)
Taking nexium and controlled. Will continue.

## 2023-03-23 ENCOUNTER — Other Ambulatory Visit: Payer: Self-pay | Admitting: Internal Medicine

## 2023-03-23 ENCOUNTER — Encounter: Payer: Self-pay | Admitting: Internal Medicine

## 2023-03-23 MED ORDER — VITAMIN D (ERGOCALCIFEROL) 1.25 MG (50000 UNIT) PO CAPS: 50000.0000 [IU] | ORAL_CAPSULE | ORAL | 0 refills | Status: AC

## 2023-04-17 ENCOUNTER — Ambulatory Visit: Payer: No Typology Code available for payment source

## 2023-04-17 DIAGNOSIS — Z7901 Long term (current) use of anticoagulants: Secondary | ICD-10-CM

## 2023-04-17 LAB — POCT INR: INR: 1.8 — AB (ref 2.0–3.0)

## 2023-04-17 NOTE — Progress Notes (Signed)
 Pt reports her warfarin has changed manufacturer. This could be causing the recent subtherapeutic INRs. Increase dose today to take 1 1/2 tablets and then change weekly dose to take 2 tablet daily except take 1 tablets on Monday, Wednesday and Friday. Recheck in 3 weeks.

## 2023-04-17 NOTE — Patient Instructions (Addendum)
 Pre visit review using our clinic review tool, if applicable. No additional management support is needed unless otherwise documented below in the visit note.  Increase dose today to take 1 1/2 tablets and then change weekly dose to take 2 tablet daily except take 1 tablets on Monday, Wednesday and Friday. Recheck in 3 weeks.

## 2023-05-08 ENCOUNTER — Ambulatory Visit

## 2023-05-08 DIAGNOSIS — Z7901 Long term (current) use of anticoagulants: Secondary | ICD-10-CM | POA: Diagnosis not present

## 2023-05-08 LAB — POCT INR: INR: 1.8 — AB (ref 2.0–3.0)

## 2023-05-08 NOTE — Patient Instructions (Addendum)
 Pre visit review using our clinic review tool, if applicable. No additional management support is needed unless otherwise documented below in the visit note.  Increase dose today to take 1 1/2 tablets and then change weekly dose to take 2 tablet daily except take 1 tablets on Monday and Friday. Recheck in 2 weeks.

## 2023-05-08 NOTE — Progress Notes (Signed)
 Pt reports her warfarin has changed manufacturer. This could be causing the recent subtherapeutic INRs. Increase dose today to take 1 1/2 tablets and then change weekly dose to take 2 tablet daily except take 1 tablets on Monday and Friday. Recheck in 2 weeks.

## 2023-05-22 ENCOUNTER — Ambulatory Visit

## 2023-05-22 DIAGNOSIS — Z7901 Long term (current) use of anticoagulants: Secondary | ICD-10-CM

## 2023-05-22 LAB — POCT INR: INR: 2.4 (ref 2.0–3.0)

## 2023-05-22 NOTE — Progress Notes (Addendum)
 Continue 2 tablet daily except take 1 tablets on Monday and Friday. Recheck in 6 weeks.

## 2023-05-22 NOTE — Patient Instructions (Addendum)
 Pre visit review using our clinic review tool, if applicable. No additional management support is needed unless otherwise documented below in the visit note.  Continue 2 tablet daily except take 1 tablets on Monday and Friday. Recheck in 6 weeks.

## 2023-07-10 ENCOUNTER — Ambulatory Visit (INDEPENDENT_AMBULATORY_CARE_PROVIDER_SITE_OTHER)

## 2023-07-10 DIAGNOSIS — Z7901 Long term (current) use of anticoagulants: Secondary | ICD-10-CM | POA: Diagnosis not present

## 2023-07-10 LAB — POCT INR: INR: 2.7 (ref 2.0–3.0)

## 2023-07-10 NOTE — Patient Instructions (Signed)
 Pre visit review using our clinic review tool, if applicable. No additional management support is needed unless otherwise documented below in the visit note.  Continue 2 tablet daily except take 1 tablets on Monday and Friday. Recheck in 8 weeks.

## 2023-07-10 NOTE — Progress Notes (Addendum)
 Continue 2 tablet daily except take 1 tablets on Monday and Friday. Recheck in 8 weeks per pt request.

## 2023-08-08 ENCOUNTER — Other Ambulatory Visit: Payer: Self-pay | Admitting: Internal Medicine

## 2023-08-08 DIAGNOSIS — Z7901 Long term (current) use of anticoagulants: Secondary | ICD-10-CM

## 2023-08-10 NOTE — Telephone Encounter (Signed)
 Pt is compliant with warfarin management and PCP apts.  Sent in refill of warfarin to requested pharmacy.

## 2023-09-04 ENCOUNTER — Ambulatory Visit

## 2023-09-11 ENCOUNTER — Ambulatory Visit (INDEPENDENT_AMBULATORY_CARE_PROVIDER_SITE_OTHER)

## 2023-09-11 DIAGNOSIS — Z7901 Long term (current) use of anticoagulants: Secondary | ICD-10-CM | POA: Diagnosis not present

## 2023-09-11 LAB — POCT INR: INR: 2.5 (ref 2.0–3.0)

## 2023-09-11 NOTE — Patient Instructions (Addendum)
 Pre visit review using our clinic review tool, if applicable. No additional management support is needed unless otherwise documented below in the visit note.  Continue 2 tablet daily except take 1 tablets on Monday and Friday. Recheck in 8 weeks.

## 2023-09-11 NOTE — Progress Notes (Signed)
 Continue 2 tablet daily except take 1 tablets on Monday and Friday. Recheck in 8 weeks per pt request.

## 2023-11-06 ENCOUNTER — Ambulatory Visit

## 2023-11-06 DIAGNOSIS — Z7901 Long term (current) use of anticoagulants: Secondary | ICD-10-CM

## 2023-11-06 LAB — POCT INR: INR: 3.5 — AB (ref 2.0–3.0)

## 2023-11-06 NOTE — Patient Instructions (Addendum)
 Pre visit review using our clinic review tool, if applicable. No additional management support is needed unless otherwise documented below in the visit note.  Hold warfarin tomorrow and then change weekly dose to take 2 tablets daily except take 1 tablet on Monday, Wednesday and Friday. Recheck in 2 weeks.

## 2023-11-06 NOTE — Progress Notes (Signed)
 Pt already took warfarin today. Hold warfarin tomorrow and then change weekly dose to take 2 tablets daily except take 1 tablet on Monday, Wednesday and Friday. Recheck in 2 weeks.

## 2023-11-20 ENCOUNTER — Ambulatory Visit

## 2023-11-20 DIAGNOSIS — Z7901 Long term (current) use of anticoagulants: Secondary | ICD-10-CM | POA: Diagnosis not present

## 2023-11-20 LAB — POCT INR: INR: 1.8 — AB (ref 2.0–3.0)

## 2023-11-20 NOTE — Progress Notes (Addendum)
 Indication: Factor V Leiden, recurrent DVT Pt started a weight loss program a couple of weeks ago and her supratherapeutic INR at last visit was caused by the starting diet, which lasted for 2 days. There was a change to her weekly warfarin dose at the last visit. She is now eating only lean meats, vegetables and specific fruit under provider guidance. This is an increase in vitamin K containing foods. Due to subtherapeutic INR today and increase in vegetables will make a change to move weekly dosing to prior dosing, which pt was very consistent on.  Increase dose today to take 1 1/2 tablets and then change weekly dose to take 2 tablets daily except take 1 tablet on Monday  and Friday. Recheck in 2 weeks.

## 2023-11-20 NOTE — Patient Instructions (Addendum)
 Pre visit review using our clinic review tool, if applicable. No additional management support is needed unless otherwise documented below in the visit note.  Increase dose today to take 1 1/2 tablets and then change weekly dose to take 2 tablets daily except take 1 tablet on Monday  and Friday. Recheck in 2 weeks.

## 2023-12-04 ENCOUNTER — Ambulatory Visit

## 2023-12-04 DIAGNOSIS — Z7901 Long term (current) use of anticoagulants: Secondary | ICD-10-CM

## 2023-12-04 LAB — POCT INR: INR: 1.9 — AB (ref 2.0–3.0)

## 2023-12-04 NOTE — Patient Instructions (Addendum)
 Pre visit review using our clinic review tool, if applicable. No additional management support is needed unless otherwise documented below in the visit note.  Increase dose today to take 1 1/2 tablets and then change weekly dose to take 2 tablets daily except take 1 tablet on Friday. Recheck in 2 weeks.

## 2023-12-04 NOTE — Progress Notes (Signed)
 Indication: Factor V Leiden, recurrent DVT Pt started a weight loss program a couple of weeks ago. She is now eating only lean meats, vegetables and specific fruit under provider guidance. This is an increase in vitamin K containing foods.  Increase dose today to take 1 1/2 tablets and then change weekly dose to take 2 tablets daily except take 1 tablet on Friday. Recheck in 2 weeks.

## 2023-12-18 ENCOUNTER — Ambulatory Visit (INDEPENDENT_AMBULATORY_CARE_PROVIDER_SITE_OTHER)

## 2023-12-18 DIAGNOSIS — Z7901 Long term (current) use of anticoagulants: Secondary | ICD-10-CM

## 2023-12-18 LAB — POCT INR: INR: 2.6 (ref 2.0–3.0)

## 2023-12-18 NOTE — Patient Instructions (Addendum)
 Pre visit review using our clinic review tool, if applicable. No additional management support is needed unless otherwise documented below in the visit note.  Continue 2 tablets daily except take 1 tablet on Friday. Recheck in 6 weeks.

## 2023-12-18 NOTE — Progress Notes (Signed)
 Indication: Factor V Leiden, recurrent DVT Continue 2 tablets daily except take 1 tablet on Friday. Recheck in 6 weeks.

## 2024-01-06 ENCOUNTER — Ambulatory Visit: Payer: Self-pay

## 2024-01-06 NOTE — Telephone Encounter (Signed)
 FYI Only or Action Required?: FYI only for provider: appointment scheduled on 01/07/24.  Patient was last seen in primary care on 03/20/2023 by Rollene Almarie LABOR, MD.  Called Nurse Triage reporting No chief complaint on file..  Symptoms began several days ago.  Interventions attempted: OTC medications: Benadryl and Rest, hydration, or home remedies.  Symptoms are: unchanged.  Triage Disposition: See Physician Within 24 Hours  Patient/caregiver understands and will follow disposition?: Yes  Copied from CRM #[id number]. Topic: Clinical - Red Word Triage >> Jan 06, 2024 10:39 AM Eva FALCON wrote: Red Word that prompted transfer to Nurse Triage: itching, skin has gone crazy, bumps that look like hives but some that are just red, spot of cellulitis from a blood clot is itching and getting bigger on lower right leg. Reason for Disposition  SEVERE itching (i.e., interferes with sleep, normal activities or school)  Answer Assessment - Initial Assessment Questions Additional info: Patient calling in to schedule evaluation of new onset rash 3 days ago, treating with benadryl with relief to itching. Rash is persisting. She also has chronic cellulitis on right lower extremity where she had DVT, on coumadin , she states cellulitis site always has had a dark center and surrounding redness, the redness is seeming to spread slightly however she is not sure if it is actually spreading or just appears that way from itching the site. Denies fevers, breathing difficulties, swallowing difficulties, pain, and all other symptoms.     1. APPEARANCE of RASH: What does the rash look like? (e.g., blisters, dry flaky skin, red spots, redness, sores)     Right leg  2. SIZE: How big are the spots? (e.g., tip of pen, eraser, coin; inches, centimeters)     Multiple  3. LOCATION: Where is the rash located?     Leg, torso, upper back.  4. COLOR: What color is the rash? (Note: It is difficult to assess rash  color in people with darker-colored skin. When this situation occurs, simply ask the caller to describe what they see.)     red 5. ONSET: When did the rash begin?     3 days ago  6. FEVER: Do you have a fever? If Yes, ask: What is your temperature, how was it measured, and when did it start?     Denies  7. ITCHING: Does the rash itch? If Yes, ask: How bad is the itch? (Scale 1-10; or mild, moderate, severe)     Yes-using benadryl with effect. 8. CAUSE: What do you think is causing the rash?     Unsure  9. MEDICINE FACTORS: Have you started any new medicines within the last 2 weeks? (e.g., antibiotics)      Denies new medications or any new products or foods 10. OTHER SYMPTOMS: Do you have any other symptoms? (e.g., dizziness, headache, sore throat, joint pain)       Cellulitis is chronic on lower right leg, seems to be getting larger.  11. PREGNANCY: Is there any chance you are pregnant? When was your last menstrual period?  Protocols used: Rash or Redness - [facility]

## 2024-01-07 ENCOUNTER — Telehealth: Payer: Self-pay | Admitting: Nurse Practitioner

## 2024-01-07 ENCOUNTER — Ambulatory Visit (INDEPENDENT_AMBULATORY_CARE_PROVIDER_SITE_OTHER): Admitting: Nurse Practitioner

## 2024-01-07 VITALS — BP 118/80 | HR 69 | Temp 98.3°F | Ht 68.0 in | Wt 235.4 lb

## 2024-01-07 DIAGNOSIS — R21 Rash and other nonspecific skin eruption: Secondary | ICD-10-CM | POA: Diagnosis not present

## 2024-01-07 DIAGNOSIS — Z7901 Long term (current) use of anticoagulants: Secondary | ICD-10-CM

## 2024-01-07 MED ORDER — PREDNISONE 20 MG PO TABS: 40.0000 mg | ORAL_TABLET | Freq: Every day | ORAL | 0 refills | Status: AC

## 2024-01-07 NOTE — Patient Instructions (Signed)
 Coumadin  Clinic should be reaching out to schedule INR check for this Tuesday. Call our office if you do not hear from the clinic scheduler by the end of the day.

## 2024-01-07 NOTE — Telephone Encounter (Signed)
 Contacted pt and advised of interaction. Scheduled pt for Tuesday, 12/16, of next week. Pt verbalized understanding.

## 2024-01-07 NOTE — Progress Notes (Signed)
 Established Patient Office Visit  Subjective   Patient ID: Megan Parrish, female    DOB: 1955/10/29  Age: 68 y.o. MRN: [id number]  Chief Complaint  Patient presents with   Rash    Rash all over the body, started this past Sunday, itchiness to it and pt has been taken benadrly at night     Discussed the use of AI scribe software for clinical note transcription with the patient, who gave verbal consent to proceed.  History of Present Illness Megan Parrish is a 68 year old female who presents with an acute rash.  Cutaneous eruption - Acute onset rash began on Sunday (4 days ago) on the legs and has spread to other areas - Rash is intensely pruritic with both raised erythematous and flat erythematous areas - No prior similar rash - Scratching provides brief relief but increases irritation  Symptom management - Took two Benadryl at night with only partial relief of pruritus - Continues to experience severe itching  Associated symptoms and systemic involvement - No eye, nasal, oral, vaginal, or rectal pain - No blistering - No breathing difficulty, throat tightness, or wheezing  Potential exposures and triggers - No new medications - No changes in detergents or soaps - Recently attended a potluck but did not eat anything unusual - Was outdoors a week prior to onset putting up signs in the woods - Handled a fresh Christmas wreath  Anticoagulation therapy - Currently taking Coumadin  (for treatment of 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  Eyes:  Negative for blurred vision.  Respiratory:  Negative for cough, shortness of breath and wheezing.   Musculoskeletal:  Negative for joint pain and myalgias ((-) mucosal pain).  Skin:  Positive for itching and rash.      Objective:     BP 118/80   Pulse 69   Temp 98.3 F (36.8 C) (Temporal)   Ht 5' 8 (1.727 m)   Wt  235 lb 6 oz (106.8 kg)   LMP 04/28/2015   SpO2 96%   BMI 35.79 kg/m  BP Readings from Last 3 Encounters:  01/07/24 118/80  03/20/23 118/80  01/31/22 120/86   Wt Readings from Last 3 Encounters:  01/07/24 235 lb 6 oz (106.8 kg)  03/20/23 252 lb (114.3 kg)  01/31/22 259 lb (117.5 kg)      Physical Exam Vitals reviewed.  Constitutional:      General: She is not in acute distress.    Appearance: Normal appearance.  HENT:     Head: Normocephalic and atraumatic.  Cardiovascular:     Rate and Rhythm: Normal rate.     Pulses: Normal pulses.  Pulmonary:     Effort: Pulmonary effort is normal.  Skin:    General: Skin is warm and dry.         Comments: Diffuse maculopapular rash on chest, arms, legs, and abdomen.  Under red line indicates area of chronic hyperpigmentation with acute worsening per patient  Neurological:     General: No focal deficit present.     Mental Status: She is alert and oriented to person, place, and time.  Psychiatric:        Mood and Affect: Mood normal.        Behavior: Behavior normal.        Judgment: Judgment normal.      No results found for any visits  on 01/07/24.    The 10-year ASCVD risk score (Arnett DK, et al., 2019) is: 6.1%    Assessment & Plan:   Problem List Items Addressed This Visit   None Visit Diagnoses       Rash    -  Primary   Relevant Medications   predniSONE (DELTASONE) 20 MG tablet     Long term (current) use of anticoagulants [Z79.01]       Relevant Medications   warfarin (COUMADIN ) 5 MG tablet      Assessment and Plan Assessment & Plan Acute generalized rash Acute rash with severe pruritus, likely allergic reaction vs tick-borne illness, maybe new onset of alpha-gal allergy? Some areas of the rash may be consistent with potential cellulitis; patient instructed to monitor for infection signs.  - Prescribed prednisone 40 mg daily for 5 days. - Instructed to monitor for bleeding due to Coumadin   interaction. - Educated on prednisone side effects. - Advised to return if symptoms persist. Consider evaluation for alpha gal allergy? - Instructed to monitor for infection signs and contact clinic if she occurs.   Chronic deep vein thrombosis on anticoagulation Chronic DVT on Coumadin , potential interaction with prednisone requires INR monitoring. - Notified Coumadin  clinic for INR check on Tuesday. Coumadin  clinic stated they would reach out to patient for scheduling.  - Instructed to monitor for bleeding and report if it occurs.    Return if symptoms worsen or fail to improve.    Lauraine FORBES Pereyra, NP

## 2024-01-07 NOTE — Telephone Encounter (Signed)
 As we discussed earlier, patient is being prescribed prednisone. This can impact warfarin dosing and INR levels. Will you please schedule her with you if you have availability for this upcoming Tuesday (01/12/24)  for INR check?

## 2024-01-12 ENCOUNTER — Ambulatory Visit

## 2024-01-12 DIAGNOSIS — Z7901 Long term (current) use of anticoagulants: Secondary | ICD-10-CM

## 2024-01-12 LAB — POCT INR: INR: 2.8 (ref 2.0–3.0)

## 2024-01-12 NOTE — Progress Notes (Signed)
 Indication: Factor V Leiden, recurrent DVT Pt was prescribed prednisone  on 12/11 x 5 days for rash. Pt reports rash is almost completely cleared now. Continue 2 tablets daily except take 1 tablet on Friday. Recheck in 6 weeks.

## 2024-01-12 NOTE — Patient Instructions (Addendum)
 Pre visit review using our clinic review tool, if applicable. No additional management support is needed unless otherwise documented below in the visit note.  Continue 2 tablets daily except take 1 tablet on Friday. Recheck in 6 weeks.

## 2024-02-05 ENCOUNTER — Ambulatory Visit

## 2024-02-17 ENCOUNTER — Other Ambulatory Visit: Payer: Self-pay | Admitting: Internal Medicine

## 2024-02-26 ENCOUNTER — Ambulatory Visit

## 2024-02-26 DIAGNOSIS — Z7901 Long term (current) use of anticoagulants: Secondary | ICD-10-CM

## 2024-02-26 LAB — POCT INR: INR: 3.8 — AB (ref 2.0–3.0)

## 2024-02-26 NOTE — Progress Notes (Signed)
 Indication: Factor V Leiden, recurrent DVT Pt reports a lot of stress over the last two weeks due to husband having surgery. Mental stress has been seen to increase INR.  Pt already took warfarin today. Hold warfarin tomorrow and then change weekly 2 tablets daily except take 1 tablet on Monday and Friday. Recheck in 3 weeks.

## 2024-02-26 NOTE — Patient Instructions (Addendum)
 Pre visit review using our clinic review tool, if applicable. No additional management support is needed unless otherwise documented below in the visit note.  Hold warfarin tomorrow and then change weekly 2 tablets daily except take 1 tablet on Monday and Friday. Recheck in 3 weeks.

## 2024-03-18 ENCOUNTER — Ambulatory Visit
